# Patient Record
Sex: Male | Born: 1965 | Race: White | Hispanic: No | Smoking: Never smoker
Health system: Southern US, Community
[De-identification: ages and names within clinical notes are randomized; demographics above are authoritative.]

## PROBLEM LIST (undated history)

## (undated) ENCOUNTER — Emergency Department (HOSPITAL_COMMUNITY): Admission: EM | Discharge status: Against Medical Advice | Payer: Self-pay

## (undated) DIAGNOSIS — F1010 Alcohol abuse, uncomplicated: Secondary | ICD-10-CM

## (undated) DIAGNOSIS — E7800 Pure hypercholesterolemia, unspecified: Secondary | ICD-10-CM

## (undated) DIAGNOSIS — I10 Essential (primary) hypertension: Secondary | ICD-10-CM

## (undated) DIAGNOSIS — R45851 Suicidal ideations: Secondary | ICD-10-CM

## (undated) DIAGNOSIS — E119 Type 2 diabetes mellitus without complications: Secondary | ICD-10-CM

## (undated) HISTORY — PX: KNEE SURGERY: SHX244

## (undated) HISTORY — DX: Pure hypercholesterolemia, unspecified: E78.00

## (undated) HISTORY — DX: Essential (primary) hypertension: I10

## (undated) HISTORY — PX: FOOT SURGERY: SHX648

---

## 2011-10-17 ENCOUNTER — Ambulatory Visit: Payer: Self-pay

## 2013-02-10 ENCOUNTER — Emergency Department (HOSPITAL_COMMUNITY)
Admission: EM | Admit: 2013-02-10 | Discharge: 2013-02-10 | Disposition: A | Discharge status: Home / Self Care | Payer: Self-pay | Attending: Emergency Medicine | Admitting: Emergency Medicine

## 2013-02-10 ENCOUNTER — Encounter (HOSPITAL_COMMUNITY): Payer: Self-pay | Admitting: *Deleted

## 2013-02-10 DIAGNOSIS — F1010 Alcohol abuse, uncomplicated: Secondary | ICD-10-CM | POA: Insufficient documentation

## 2013-02-10 DIAGNOSIS — F17200 Nicotine dependence, unspecified, uncomplicated: Secondary | ICD-10-CM | POA: Insufficient documentation

## 2013-02-10 DIAGNOSIS — F10929 Alcohol use, unspecified with intoxication, unspecified: Secondary | ICD-10-CM

## 2013-02-10 LAB — COMPREHENSIVE METABOLIC PANEL
Albumin: 4.2 g/dL (ref 3.5–5.2)
Alkaline Phosphatase: 83 U/L (ref 39–117)
BUN: 8 mg/dL (ref 6–23)
Calcium: 8.9 mg/dL (ref 8.4–10.5)
GFR calc Af Amer: >90 mL/min (ref >90)
Glucose, Bld: 114 mg/dL — ABNORMAL HIGH (ref 70–99)
Potassium: 3.3 mEq/L — ABNORMAL LOW (ref 3.5–5.1)
Sodium: 140 mEq/L (ref 135–145)
Total Protein: 8.2 g/dL (ref 6.0–8.3)

## 2013-02-10 LAB — CBC WITH DIFFERENTIAL/PLATELET
Basophils Relative: 0 % (ref 0–1)
Eosinophils Absolute: 0.2 10*3/uL (ref 0.0–0.7)
Eosinophils Relative: 3 % (ref 0–5)
MCH: 31.1 pg (ref 26.0–34.0)
MCHC: 33.3 g/dL (ref 30.0–36.0)
MCV: 93.2 fL (ref 78.0–100.0)
Monocytes Relative: 4 % (ref 3–12)
Neutrophils Relative %: 48 % (ref 43–77)
Platelets: 254 10*3/uL (ref 150–400)

## 2013-02-10 LAB — RAPID URINE DRUG SCREEN, HOSP PERFORMED
Amphetamines: NOT DETECTED
Benzodiazepines: NOT DETECTED
Opiates: NOT DETECTED
Tetrahydrocannabinol: NOT DETECTED

## 2013-02-10 LAB — GLUCOSE, CAPILLARY: Glucose-Capillary: 109 mg/dL — ABNORMAL HIGH (ref 70–99)

## 2013-02-10 MED ORDER — THIAMINE HCL 100 MG/ML IJ SOLN: 100.0000 mg | Freq: Once | INTRAMUSCULAR | Status: DC

## 2013-02-10 MED ORDER — SODIUM CHLORIDE 0.9 % IV BOLUS (SEPSIS): 1000.0000 mL | Freq: Once | INTRAVENOUS | Status: DC

## 2013-02-10 MED ORDER — ZIPRASIDONE MESYLATE 20 MG IM SOLR: 10.0000 mg | Freq: Once | INTRAMUSCULAR | Status: DC | PRN

## 2013-02-10 NOTE — ED Provider Notes (Signed)
Pt refuses IV fluids or additional treatment.  He is awake and alert at this time.  He denies SI or HI.  He is not interested in treatment for alcohol abuse.  He does not appear to be psychotic or responding to internal stimuli.  Pt requests to be released.  At this time, I do not feel he meets criteria for involuntary commitment.  He does not appear to be a danger to himself or others although clearly his alcoholism is detrimental to his health.  Alcohol treatment offered but pt refuses.  He state the only way we could help him is by giving him something alcoholic to drink.  Filed Vitals:   02/10/13 0500  BP:   Pulse: 73  Temp:   Resp:      Celene Kras, MD 02/10/13 [phone]

## 2013-02-10 NOTE — ED Notes (Signed)
Pt. Changed into new blue scrubs. Pt. Has 2 back packs,1 Belongings bag, computer bag. Pt. Has pants, sweater, boots, belt, socks, underwear. pt. And belongings wanded and searched by security.

## 2013-02-10 NOTE — ED Provider Notes (Addendum)
History     CSN: [id number]  Arrival date & time 02/10/13  0039   First MD Initiated Contact with Patient 02/10/13 0136      No chief complaint on file. Level V caveat secondary to intoxication  (Consider location/radiation/quality/duration/timing/severity/associated sxs/prior treatment) HPI WARRICK LLERA is a 47 y.o. male with no pertinent medical history presents via EMS [facility] a [facility]" where patient was found to be intoxicated.  Pt says he's been drinking, denies any pain.  Poor historian with EtOH on board. Symptoms are severe, constant unchanged without interventions.   History reviewed. No pertinent past medical history.  History reviewed. No pertinent past surgical history.  History reviewed. No pertinent family history.  History  Substance Use Topics  . Smoking status: Current Every Day Smoker  . Smokeless tobacco: Current User  . Alcohol Use: Yes      Review of Systems Level V caveat 2/2 to intoxication Allergies  Review of patient's allergies indicates no known allergies.  Home Medications  No current outpatient prescriptions on file.  BP 124/86  Pulse 86  Temp(Src) 97.4 F (36.3 C) (Oral)  Resp 18  SpO2 98%  Physical Exam  Nursing notes reviewed.  Electronic medical record reviewed. VITAL SIGNS:   Filed Vitals:   02/10/13 0040  BP: 124/86  Pulse: 86  Temp: 97.4 F (36.3 C)  TempSrc: Oral  Resp: 18  SpO2: 98%   CONSTITUTIONAL: Awake, oriented, appears non-toxic HENT: Atraumatic, normocephalic, oral mucosa pink and moist, airway patent.  Chewing tobacco in mouth, foul breath.  Nares patent without drainage. External ears normal. EYES: Conjunctiva injected, EOMI, PERRLA NECK: Trachea midline, non-tender, supple CARDIOVASCULAR: Normal heart rate, Normal rhythm, No murmurs, rubs, gallops PULMONARY/CHEST: Clear to auscultation, no rhonchi, wheezes, or rales. Symmetrical breath sounds. Non-tender. ABDOMINAL: Non-distended, soft, non-tender  - no rebound or guarding.  BS normal. NEUROLOGIC: Non-focal, moving all four extremities, no gross sensory or motor deficits. EXTREMITIES: No clubbing, cyanosis, or edema SKIN: Warm, Dry, No erythema, scattered pustules over abdomen and upper chest, no cellulitis.  ED Course  Procedures (including critical care time)  Date: 02/10/2013  Rate: 87  Rhythm: normal sinus rhythm  QRS Axis: normal  Intervals: normal  ST/T Wave abnormalities: normal  Conduction Disutrbances: none  Narrative Interpretation: unremarkable  Labs Reviewed  GLUCOSE, CAPILLARY - Abnormal; Notable for the following:    Glucose-Capillary 109 (*)    All other components within normal limits  CBC WITH DIFFERENTIAL  COMPREHENSIVE METABOLIC PANEL  URINE RAPID DRUG SCREEN (HOSP PERFORMED)  ETHANOL   No results found.   1. Alcohol intoxication   2. Tobacco abuse       MDM  KABIR BRANNOCK is a 47 y.o. male presents intoxicated, no signs of trauma.  Pt initially ambulatory, then EMS called 2/2 to pt being so drunk.  No history of trauma do not think any further workup is indicated at this time.  On awakening, patient still appears intoxicated however is extremely emotionally labile, hyperreligious holding on to a small burlap/jute crucifix, crying - referring to a "little feller" who died, he was evidently in the [employer] and killed someone - perseverating on that. He is not redirectable, tangential.  He may still be intoxicated, however, I think his behavior is bizarre and requires further workup.  Medical clearance labs ordered.  Normal EKG. ACT consult placed.   D/w Dr. Cleotis Lema, MD 02/10/13 4098  Jones Skene, MD 02/10/13  0711 

## 2013-02-10 NOTE — ED Notes (Signed)
Per EMS report: EMS called for pt after pt showed up at a warming [facility] and drinking.  [employer] called and attempted to take to another shelter which would not take the pt. Pt was taken to jail but was unable to walk on his own into jail.  EMS called for transport to hospital.  Pt currently on a long spine board d/t pt attempting to swing at EMS staff. Pt smells of ETOH and evidence of chewing tobacco noted on lips.

## 2013-02-10 NOTE — ED Notes (Signed)
ZOX:[id number]<AV> Expected date:<BR> Expected time:<BR> Means of arrival:<BR> Comments:<BR> EMS, ETOH and combative from  jail; 47yo male

## 2013-02-10 NOTE — ED Notes (Addendum)
EKG given to EDP, Bonk,MD. Pt. CBG 109, RN,Merle made aware.

## 2013-03-04 ENCOUNTER — Encounter (HOSPITAL_COMMUNITY): Payer: Self-pay | Admitting: Emergency Medicine

## 2013-03-04 ENCOUNTER — Emergency Department (HOSPITAL_COMMUNITY)
Admission: EM | Admit: 2013-03-04 | Discharge: 2013-03-04 | Disposition: A | Discharge status: Home / Self Care | Payer: Self-pay | Attending: Emergency Medicine | Admitting: Emergency Medicine

## 2013-03-04 DIAGNOSIS — H6120 Impacted cerumen, unspecified ear: Secondary | ICD-10-CM | POA: Insufficient documentation

## 2013-03-04 DIAGNOSIS — F17200 Nicotine dependence, unspecified, uncomplicated: Secondary | ICD-10-CM | POA: Insufficient documentation

## 2013-03-04 DIAGNOSIS — H9319 Tinnitus, unspecified ear: Secondary | ICD-10-CM | POA: Insufficient documentation

## 2013-03-04 DIAGNOSIS — H9190 Unspecified hearing loss, unspecified ear: Secondary | ICD-10-CM | POA: Insufficient documentation

## 2013-03-04 DIAGNOSIS — H6123 Impacted cerumen, bilateral: Secondary | ICD-10-CM

## 2013-03-04 HISTORY — DX: Suicidal ideations: R45.851

## 2013-03-04 HISTORY — DX: Alcohol abuse, uncomplicated: F10.10

## 2013-03-04 HISTORY — DX: Type 2 diabetes mellitus without complications: E11.9

## 2013-03-04 MED ORDER — DOCUSATE SODIUM 100 MG PO CAPS
100.0000 mg | ORAL_CAPSULE | Freq: Once | ORAL | Status: DC
  Filled 2013-03-04: qty 1

## 2013-03-04 MED ORDER — DOCUSATE NICU RECTAL SYRINGE 10 MG/ML: 1.0000 mL | Freq: Once | RECTAL | Status: DC

## 2013-03-04 MED ORDER — DOCUSATE SODIUM 50 MG/5ML PO LIQD
100.0000 mg | Freq: Once | ORAL | Status: AC
  Administered 2013-03-04: 100 mg
  Filled 2013-03-04: qty 10

## 2013-03-04 NOTE — ED Provider Notes (Signed)
History     CSN: [id number]  Arrival date & time 03/04/13  1130   First MD Initiated Contact with Patient 03/04/13 1140      Chief Complaint  Patient presents with  . Otalgia    (Consider location/radiation/quality/duration/timing/severity/associated sxs/prior treatment) HPI  Adrian Schultz is a 47 y.o. male complaining of left ear pain worsening over the course of 4 days. It is associated with decreased hearing acuity and are roaring tinnitus. Patient denies fever, nasal drainage, cough, sore throat, chest pain or shortness of breath.  No past medical history on file.  No past surgical history on file.  No family history on file.  History  Substance Use Topics  . Smoking status: Current Every Day Smoker  . Smokeless tobacco: Current User  . Alcohol Use: Yes      Review of Systems  Constitutional: Negative for fever.  HENT: Positive for hearing loss, ear pain and tinnitus. Negative for congestion and sore throat.   Respiratory: Negative for shortness of breath.   Cardiovascular: Negative for chest pain.  Gastrointestinal: Negative for nausea, vomiting, abdominal pain and diarrhea.  All other systems reviewed and are negative.    Allergies  Review of patient's allergies indicates no known allergies.  Home Medications  No current outpatient prescriptions on file.  There were no vitals taken for this visit.  Physical Exam  Nursing note and vitals reviewed. Constitutional: He is oriented to person, place, and time. He appears well-developed and well-nourished. No distress.  HENT:  Head: Normocephalic.  Bilateral cerumen impaction  Eyes: Conjunctivae and EOM are normal.  Cardiovascular: Normal rate.   Pulmonary/Chest: Effort normal. No stridor.  Musculoskeletal: Normal range of motion.  Neurological: He is alert and oriented to person, place, and time.  Psychiatric: He has a normal mood and affect.    ED Course  EAR CERUMEN REMOVAL Date/Time: 03/04/2013 2:28  PM Performed by: Wynetta Emery Authorized by: Wynetta Emery Consent: Verbal consent obtained. Risks and benefits: risks, benefits and alternatives were discussed Consent given by: patient Patient understanding: patient states understanding of the procedure being performed Patient identity confirmed: verbally with patient and arm band Local anesthetic: none Ceruminolytics applied: Ceruminolytics applied prior to the procedure. Location details: right ear Procedure type: curette and irrigation Patient sedated: no Patient tolerance: Patient tolerated the procedure well with no immediate complications. Comments: Large amounts of dark serum and removed after flushing with 750 mL of 1:1 warmed sterile water and hydrogen peroxide. No trauma to eardrum or external ear canal from procedure.  Patient reports significant subjective improvement in both pain and hearing acuity after procedure.   (including critical care time)  Labs Reviewed - No data to display No results found.  1:44 PM Patient care time extended because we are having issues obtaining docusate to soften cerumen. Nursing irrigation does not clear the impaction. I will perform the irrigation myself.   1. Cerumen impaction, bilateral       MDM   Adrian Schultz is a 47 y.o. male bilateral cerumen impaction, left ear with pain and decreased hearing acuity. Performed irrigation with relief of impaction and subjective improvement in symptoms. Patient advised to use Debrox.    Filed Vitals:   03/04/13 1200  BP: 147/95  Pulse: 81  Temp: 97.6 F (36.4 C)  TempSrc: Oral  Resp: 18  SpO2: 95%     Pt verbalized understanding and agrees with care plan. Outpatient follow-up and return precautions given.  Wynetta Emery, PA-C 03/04/13 1527

## 2013-03-04 NOTE — ED Notes (Signed)
3 dayghx of l/ear pain. Security-escort at bedside

## 2013-03-04 NOTE — ED Notes (Signed)
Pt tolerated irrigation of l/ear by PA. Large amt. dried wax removed. Report called to Academic librarian at [facility]

## 2013-03-04 NOTE — ED Provider Notes (Signed)
Medical screening examination/treatment/procedure(s) were performed by non-physician practitioner and as supervising physician I was immediately available for consultation/collaboration.  Osiris Odriscoll L Manna Gose, MD 03/04/13 1534 

## 2013-07-09 ENCOUNTER — Encounter (HOSPITAL_COMMUNITY): Payer: Self-pay

## 2013-07-09 ENCOUNTER — Emergency Department (HOSPITAL_COMMUNITY)
Admission: EM | Admit: 2013-07-09 | Discharge: 2013-07-09 | Disposition: A | Discharge status: Home / Self Care | Payer: Self-pay | Attending: Emergency Medicine | Admitting: Emergency Medicine

## 2013-07-09 DIAGNOSIS — F17200 Nicotine dependence, unspecified, uncomplicated: Secondary | ICD-10-CM | POA: Insufficient documentation

## 2013-07-09 DIAGNOSIS — IMO0002 Reserved for concepts with insufficient information to code with codable children: Secondary | ICD-10-CM | POA: Diagnosis present

## 2013-07-09 DIAGNOSIS — Z8659 Personal history of other mental and behavioral disorders: Secondary | ICD-10-CM | POA: Insufficient documentation

## 2013-07-09 DIAGNOSIS — E119 Type 2 diabetes mellitus without complications: Secondary | ICD-10-CM | POA: Insufficient documentation

## 2013-07-09 DIAGNOSIS — F1010 Alcohol abuse, uncomplicated: Secondary | ICD-10-CM | POA: Insufficient documentation

## 2013-07-09 DIAGNOSIS — R000 Tachycardia, unspecified: Secondary | ICD-10-CM | POA: Insufficient documentation

## 2013-07-09 LAB — GLUCOSE, CAPILLARY

## 2013-07-09 MED ORDER — SODIUM CHLORIDE 0.9 % IV BOLUS (SEPSIS)
1000.0000 mL | Freq: Once | INTRAVENOUS | Status: AC
  Administered 2013-07-09: 1000 mL via INTRAVENOUS

## 2013-07-09 NOTE — ED Notes (Signed)
Took pt a pair of scrubs for him to change into and pt said why. Charge nurse and [employer] had to go an explain to him if he wanted to be seen he needed to change and pt said ok. Pt said he wanted to be seen by the doctor.

## 2013-07-09 NOTE — ED Notes (Signed)
Bed: WA15 Expected date:  Expected time:  Means of arrival:  Comments: EMS ETOH 

## 2013-07-09 NOTE — ED Notes (Signed)
Per EMS pt found outside intoxicated by [employer], pt has no medical complaints

## 2013-07-09 NOTE — ED Provider Notes (Signed)
CSN: [id number]     Arrival date & time 07/09/13  1814 History     First MD Initiated Contact with Patient 07/09/13 1822     Chief Complaint  Patient presents with  . Alcohol Intoxication    detox   (Consider location/radiation/quality/duration/timing/severity/associated sxs/prior Treatment) Patient is a 47 y.o. male presenting with intoxication. The history is provided by the patient and the EMS personnel.  Alcohol Intoxication This is a new problem. The current episode started 1 to 2 hours ago. The problem occurs constantly. The problem has been gradually improving. Pertinent negatives include no chest pain, no abdominal pain, no headaches and no shortness of breath. Nothing aggravates the symptoms. Nothing relieves the symptoms. He has tried nothing for the symptoms. The treatment provided no relief.    Past Medical History  Diagnosis Date  . ETOH abuse   . Suicidal ideation   . Diabetes mellitus without complication    Past Surgical History  Procedure Laterality Date  . Knee surgery    . Foot surgery     No family history on file. History  Substance Use Topics  . Smoking status: Current Every Day Smoker    Types: Cigarettes  . Smokeless tobacco: Current User  . Alcohol Use: Yes    Review of Systems  Constitutional: Negative for fever.  HENT: Negative for rhinorrhea, drooling and neck pain.   Eyes: Negative for pain.  Respiratory: Negative for cough and shortness of breath.   Cardiovascular: Negative for chest pain and leg swelling.  Gastrointestinal: Negative for nausea, vomiting, abdominal pain and diarrhea.  Genitourinary: Negative for dysuria and hematuria.  Musculoskeletal: Negative for gait problem.  Skin: Negative for color change.  Neurological: Negative for numbness and headaches.  Hematological: Negative for adenopathy.  Psychiatric/Behavioral: Negative for behavioral problems.  All other systems reviewed and are negative.    Allergies  Review of  patient's allergies indicates no known allergies.  Home Medications  No current outpatient prescriptions on file. BP 114/66  Pulse 102  Temp(Src) 99.4 F (37.4 C) (Oral)  Resp 18  SpO2 91% Physical Exam  Nursing note and vitals reviewed. Constitutional: He is oriented to person, place, and time. He appears well-developed and well-nourished.  HENT:  Head: Normocephalic and atraumatic.  Right Ear: External ear normal.  Left Ear: External ear normal.  Nose: Nose normal.  Mouth/Throat: Oropharynx is clear and moist. No oropharyngeal exudate.  Eyes: Conjunctivae and EOM are normal. Pupils are equal, round, and reactive to light.  Neck: Normal range of motion. Neck supple.  Cardiovascular: Regular rhythm, normal heart sounds and intact distal pulses.  Exam reveals no gallop and no friction rub.   No murmur heard. Pulmonary/Chest: Effort normal and breath sounds normal. No respiratory distress. He has no wheezes.  Abdominal: Soft. Bowel sounds are normal. He exhibits no distension. There is no tenderness. There is no rebound and no guarding.  Musculoskeletal: Normal range of motion. He exhibits no edema and no tenderness.  Neurological: He is alert and oriented to person, place, and time.  Mild to moderate intoxication. Pt is interactive, will follow commands. Denies pain.   Skin: Skin is warm and dry.  Psychiatric: He has a normal mood and affect. His behavior is normal.    ED Course   Procedures (including critical care time)  Labs Reviewed  GLUCOSE, CAPILLARY - Abnormal; Notable for the following:    Glucose-Capillary 101 (*)    All other components within normal limits   No results found.  1. Intoxication     MDM  6:40 PM 47 y.o. male brought in by EMS after found intoxicated sleeping on the sidewalk. Pt has mild tachycardia here, otherwise AFVSS here. A/o x3, following commands. Denies pain. Doubt injury. Will get BS and bolus.   10:14 PM: Pt now mentating well,  ambulatory, clinically sober. I have discussed the diagnosis/risks/treatment options with the patient and believe the pt to be eligible for discharge home to follow-up with pcp as needed. We also discussed returning to the ED immediately if new or worsening sx occur. We discussed the sx which are most concerning (e.g., pain, cp, sob, further intoxication) that necessitate immediate return. Any new prescriptions provided to the patient are listed below.  New Prescriptions   No medications on file     Junius Argyle, MD 07/10/13 1204

## 2014-01-18 DIAGNOSIS — E119 Type 2 diabetes mellitus without complications: Secondary | ICD-10-CM | POA: Insufficient documentation

## 2014-01-18 DIAGNOSIS — R269 Unspecified abnormalities of gait and mobility: Secondary | ICD-10-CM | POA: Insufficient documentation

## 2014-01-18 DIAGNOSIS — Y929 Unspecified place or not applicable: Secondary | ICD-10-CM | POA: Insufficient documentation

## 2014-01-18 DIAGNOSIS — Y9301 Activity, walking, marching and hiking: Secondary | ICD-10-CM | POA: Insufficient documentation

## 2014-01-18 DIAGNOSIS — Z9889 Other specified postprocedural states: Secondary | ICD-10-CM | POA: Insufficient documentation

## 2014-01-18 DIAGNOSIS — F17200 Nicotine dependence, unspecified, uncomplicated: Secondary | ICD-10-CM | POA: Insufficient documentation

## 2014-01-18 DIAGNOSIS — S8010XA Contusion of unspecified lower leg, initial encounter: Secondary | ICD-10-CM | POA: Insufficient documentation

## 2014-01-18 DIAGNOSIS — W108XXA Fall (on) (from) other stairs and steps, initial encounter: Secondary | ICD-10-CM | POA: Insufficient documentation

## 2014-01-19 ENCOUNTER — Emergency Department (HOSPITAL_COMMUNITY)
Admission: EM | Admit: 2014-01-19 | Discharge: 2014-01-19 | Disposition: A | Discharge status: Home / Self Care | Payer: Self-pay | Attending: Emergency Medicine | Admitting: Emergency Medicine

## 2014-01-19 ENCOUNTER — Encounter (HOSPITAL_COMMUNITY): Payer: Self-pay | Admitting: Emergency Medicine

## 2014-01-19 ENCOUNTER — Emergency Department (HOSPITAL_COMMUNITY): Payer: Self-pay

## 2014-01-19 DIAGNOSIS — S8012XA Contusion of left lower leg, initial encounter: Secondary | ICD-10-CM

## 2014-01-19 MED ORDER — NAPROXEN 500 MG PO TABS: 500.0000 mg | ORAL_TABLET | Freq: Two times a day (BID) | ORAL | Status: DC

## 2014-01-19 MED ORDER — TRAMADOL HCL 50 MG PO TABS: 50.0000 mg | ORAL_TABLET | Freq: Four times a day (QID) | ORAL | Status: DC | PRN

## 2014-01-19 NOTE — ED Provider Notes (Signed)
CSN: [id number]     Arrival date & time 01/18/14  2338 History   None    Chief Complaint  Patient presents with  . left leg pain      (Consider location/radiation/quality/duration/timing/severity/associated sxs/prior Treatment) HPI Comments: 48 year old male, history of a drop foot on the left, states that he lost his balance walking down the stairs and fell on his left lateral proximal lower extremity. This was acute in onset, occurred several hours prior to arrival, the pain is persistent and worse with ambulation. He does not use a brace to walk, he has been hobbling on his leg. He denies any other injuries and has had no medication prior to arrival.  The history is provided by the patient.    Past Medical History  Diagnosis Date  . ETOH abuse   . Suicidal ideation   . Diabetes mellitus without complication    Past Surgical History  Procedure Laterality Date  . Knee surgery    . Foot surgery     History reviewed. No pertinent family history. History  Substance Use Topics  . Smoking status: Current Every Day Smoker    Types: Cigarettes  . Smokeless tobacco: Current User  . Alcohol Use: Yes    Review of Systems  Musculoskeletal: Positive for gait problem. Negative for joint swelling.  Skin: Negative for rash and wound.      Allergies  Review of patient's allergies indicates no known allergies.  Home Medications   Current Outpatient Rx  Name  Route  Sig  Dispense  Refill  . naproxen (NAPROSYN) 500 MG tablet   Oral   Take 1 tablet (500 mg total) by mouth 2 (two) times daily with a meal.   30 tablet   0   . traMADol (ULTRAM) 50 MG tablet   Oral   Take 1 tablet (50 mg total) by mouth every 6 (six) hours as needed.   15 tablet   0    BP 121/76  Pulse 101  Temp(Src) 98.1 F (36.7 C) (Oral)  Resp 18  SpO2 95% Physical Exam  Nursing note and vitals reviewed. Constitutional: He appears well-developed and well-nourished. No distress.  HENT:  Head:  Normocephalic and atraumatic.  Eyes: Conjunctivae are normal. No scleral icterus.  Cardiovascular: Normal rate, regular rhythm and intact distal pulses.   Pulmonary/Chest: Effort normal and breath sounds normal.  Musculoskeletal: He exhibits tenderness ( Tender to palpation over the left lateral fibular head, no deformity swelling or redness, supple left knee joint and left ankle joint). He exhibits no edema.  No tenderness with manipulation of the left ankle, supple joints and soft compartments, dropfoot present  Neurological: He is alert.  Skin: Skin is warm and dry. No rash noted. He is not diaphoretic.    ED Course  Procedures (including critical care time) Labs Review Labs Reviewed - No data to display Imaging Review Dg Tibia/fibula Left  01/19/2014   CLINICAL DATA:  Status post fall; diffuse left lower leg pain.  EXAM: LEFT TIBIA AND FIBULA - 2 VIEW  COMPARISON:  None.  FINDINGS: There is no evidence of fracture or dislocation. The tibia and fibula appear grossly intact. The knee joint is grossly unremarkable. No significant knee joint effusion is seen. A small fabella is noted. No significant soft tissue abnormalities are characterized on radiograph.  Scattered vascular calcifications are seen.  IMPRESSION: 1. No evidence of fracture or dislocation. 2. Scattered vascular calcifications seen.   Electronically Signed   By: Roanna Raider  M.D.   On: 01/19/2014 00:58   Dg Ankle Complete Left  01/19/2014   CLINICAL DATA:  Left ankle injury and pain.  EXAM: LEFT ANKLE COMPLETE - 3+ VIEW  COMPARISON:  None.  FINDINGS: There is mild widening at the medial tibiotalar joint. There is no evidence of fracture or dislocation.  There is no evidence of ankle effusion.  No other bony abnormalities are present.  IMPRESSION: Widening of the lateral tibiotalar joint which may represent normal variant but ligamentous injury is not excluded. No evidence of fracture.   Electronically Signed   By: Laveda AbbeJeff  Hu M.D.    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 Doubt acute fracture or injury to the ligamentous structures of the ankle. He does not describe any ankle injuries but purely a proximal left lower extremity injury just distal to the knee laterally.  Referral to orthopedics   Meds given in ED:  Medications - No data to display  New Prescriptions   NAPROXEN (NAPROSYN) 500 MG TABLET    Take 1 tablet (500 mg total) by mouth 2 (two) times daily with a meal.   TRAMADOL (ULTRAM) 50 MG TABLET    Take 1 tablet (50 mg total) by mouth every 6 (six) hours as needed.        Vida RollerBrian D Betta Balla, MD 01/19/14 [phone]

## 2014-01-19 NOTE — Discharge Instructions (Signed)
X-rays showed no signs of broken bones, used to crutches and immobilization splint as needed, followup with orthopedics within one or 2 weeks if no improvement  Please call your doctor for a followup appointment within 24-48 hours. When you talk to your doctor please let them know that you were seen in the emergency department and have them acquire all of your records so that they can discuss the findings with you and formulate a treatment plan to fully care for your new and ongoing problems.   Emergency Department Resource Guide 1) Find a Doctor and Pay Out of Pocket Although you won't have to find out who is covered by your insurance plan, it is a good idea to ask around and get recommendations. You will then need to call the office and see if the doctor you have chosen will accept you as a new patient and what types of options they offer for patients who are self-pay. Some doctors offer discounts or will set up payment plans for their patients who do not have insurance, but you will need to ask so you aren't surprised when you get to your appointment.  2) Contact Your Local Health Department Not all health departments have doctors that can see patients for sick visits, but many do, so it is worth a call to see if yours does. If you don't know where your local health department is, you can check in your phone book. The CDC also has a tool to help you locate your state's health department, and many state websites also have listings of all of their local health departments.  3) Find a Walk-in Clinic If your illness is not likely to be very severe or complicated, you may want to try a walk in clinic. These are popping up all over the country in pharmacies, drugstores, and shopping centers. They're usually staffed by nurse practitioners or physician assistants that have been trained to treat common illnesses and complaints. They're usually fairly quick and inexpensive. However, if you have serious medical  issues or chronic medical problems, these are probably not your best option.  No Primary Care Doctor: - Call Health Connect at  [phone] - they can help you locate a primary care doctor that  accepts your insurance, provides certain services, etc. - Physician Referral Service- [phone]  Chronic Pain Problems: Organization         Address  Phone   Notes  [facility]  [phone] Patients need to be referred by their primary care doctor.   Medication Assistance: Organization         Address  Phone   Notes  [facility] Medication [facility] [address] [place], [place] [address] [phone] --Must be a resident of [place] [facility] [place] -- Must have NO insurance coverage whatsoever (no Medicaid/ Medicare, etc.) -- The pt. MUST have a primary care doctor that directs their care regularly and follows them in the community   MedAssist  [phone]   [employer]  [phone]) [phone]    Agencies that provide inexpensive medical care: Organization         Address  Phone   Notes  [facility]  [phone]   [facility]    [phone]   [facility] [address] [place], [place] [address] [phone]   [facility] [address], [place] [phone]   [facility]anned [facility]    [phone]  [facility]    [phone]   [address], [place] Phone:  [phone], Fax:  [phone] Hours of Operation:  9 am - 6 pm, M-F.  Also accepts Medicaid/Medicare and self-pay.  [facility] [place], [address], [place] Phone: [phone], Fax: [phone]. Hours of Operation:  8:30 am - 5:30 pm, M-F.  Also accepts Medicaid and self-pay.  [facility] [address], [place] Phone: [phone]   [place], [place], [place]  [phone], Ext. [phone] Mondays & Thursdays: 7-9 AM.  First 15 patients are seen on a first come, first serve basis.    [facility] Providers:  Organization         Address  Phone   Notes  [facility] [address], [place] [phone] Also accepts self-pay patients.  [facility] [address], [place]  [phone]   [place], [address], [place] [phone]   [facility] [address] [place] [phone]   Lucianne Lei [address], [place]   [phone] Only accepts [place] Access [place] patients after they have their name applied to their card.   Self-Pay (no insurance) in [facility]:  Organization         Address  Phone   Notes  Sickle Cell Patients, [facility] [place] [phone]   [facility] [place] [phone]   [facility] [place]  [place], [place], [place] [phone]   [facility]/Dr. Osei-Bonsu  [address], [place] or [address], [place] [phone] Phone number for both [place] and [place] locations is the same.  [facility] [address], [place] [phone]   [facility] [address] [place] or [address] [phone]   [facility] [address] [place] [phone], phone; [phone], fax Sees patients 1st and 3rd Saturday of every month.  Must not qualify for public or private insurance (i.e. Medicaid, Medicare, [place] Health Choice, Veterans' Benefits)  Household income should be no more than 200% of the poverty level The clinic cannot treat you if you are pregnant or think you are pregnant  Sexually transmitted  diseases are not treated at the clinic.    Dental Care: Organization         Address  Phone  Notes  [facility] [place] [phone] Accepts children up to age 37 who are enrolled in [place] or [place]; pregnant women with a Medicaid card; and children who have applied for Medicaid or [place] Health Choice, but were declined, whose parents can pay a reduced fee at time of service.  [facility]  [address], [place] [phone] Accepts children up to age 49 who are enrolled in [place] or [place]; pregnant women with a Medicaid card; and children who have applied for Medicaid or [place], but were declined, whose parents can pay a reduced  fee at time of service.  [facility] Access PRO[facility]  [place] [phone] Patients are seen by appointment only. Walk-ins are not accepted. [place] will see patients 6 years of age and older. Monday - Tuesday (8am-5pm) Most Wednesdays (8:30-5pm) $30 per visit, cash only  [facility] Adult Dental Access PROGRAM  [address], [facility] [phone] Patients are seen by appointment only. Walk-ins are not accepted. [place] will see patients 65 years of age and older. One Wednesday Evening (Monthly: Volunteer Based).  $30 per visit, cash only  [place]  [phone] for adults; Children under age 24, call Graduate Pediatric Dentistry at [phone]. Children aged 79-14, please call [phone] to request a pediatric application.  Dental services are provided in all areas of dental care including fillings, crowns and bridges, complete and partial dentures, implants, gum treatment, root canals, and extractions. Preventive care is also provided. Treatment is provided to both adults and children. Patients are selected via a  lottery and there is often a waiting list.   [facility] [address] [place]  [phone] [email or web address]   [facility] [address] [place], [place] [phone], Ext. [phone] Second and Fourth Thursday of each month, opens at 6:30 AM; Clinic ends at 9 AM.  Patients are seen on a first-come first-served basis, and a limited number are seen during each clinic.   [facility]  [address] Hillard Danker [place], [place] [phone]   Eligibility Requirements You must have lived in [place], [place], or [place] counties for at least the last three months.   You cannot be eligible for state or federal sponsored [employer], including [employer], [place], or [employer].   You generally cannot be eligible for healthcare insurance through your employer.    How to apply: Eligibility screenings are held every Tuesday and Wednesday afternoon from 1:00 pm until 4:00 pm. You do not need an appointment for the interview!  [facility] [address], [place], [place]   [place]  [place] Department  [place]  [phone]    Behavioral Health Resources in the Community: Intensive Outpatient Programs Organization         Address  Phone  Notes  [facility] [address] [place], [place] [phone]   [facility] Outpatient [address], [place], [place]   ADS: Alcohol & Drug Svcs [address] [place], [place]   [place] [address],  [place], [place] or [phone]   Substance Abuse Resources Organization         Address  Phone  Notes  Alcohol and Drug Services  [phone]   [place]  [phone]   [facility] [place]   Chinita Pester  [phone]   Residential &  Outpatient Substance Abuse Program  [phone]   Psychological Services Organization         Address  Phone  Notes  [facility] [place]  [place]  [phone]   [place] [address], [place] or [phone]    Mobile Crisis Teams Organization         Address  Phone  Notes  [facility], Mobile Crisis Care Unit  [phone]  [facility]  [address] [place], [place]   [facility] [address], [place] [phone]    Self-Help/Support Groups Organization         Address  Phone             Notes  [facility] of [place] - variety of support groups  [place] Call for more information  Narcotics Anonymous (NA), [facility] [address], [employer] [place]  2 meetings at this location   Special educational needs teacher         Address  Phone  Notes  [facility] Residential Treatment [place],    [place]  [phone]   [facility]  [address] [place] [address], [place], [place]   [place] [address], [place] [phone] Admissions: 8am-3pm M-F  Incentives Substance [place] [address]    [address], [place] [phone]   [facility] [address] [place], [place], [place]   [facility] [address]  [place], [place]   [facility] - Intensive Outpatient [address], [place], [place]   [facility] ([place] [place].,  [place], [place] [phone] or [phone]   [facility] (RTS) [address], [place], [place] Accepts Medicaid  [facility] [address],  [place] [phone] Substance Abuse/Addiction Treatment   [facility] Organization          Address  Phone  Notes  [facility]  [phone]   Domenic Schwab, PhD [address] [place], [place]   [phone] or [phone]   [place] [facility] [place], [place] [phone]   [facility] [address] [place], [place] [phone] Insurance/Medicaid/sponsorship through [facility] and Families [address] [place]                                    [place], [place] [phone] [place] [address], [place] [phone]    Dr. Adele Schilder  [phone]   [facility] Dept. 1) [address] [place] 2) [facility] 3)  [place] [address], [place] [phone]  [phone]   [place] [phone] or [phone] (After Hours)

## 2014-01-19 NOTE — ED Notes (Signed)
Patient fell on ice this afternoon  And hurt his left leg.  Able to walk

## 2014-01-19 NOTE — ED Notes (Signed)
Called without response 

## 2015-02-27 ENCOUNTER — Emergency Department (HOSPITAL_COMMUNITY): Payer: Self-pay

## 2015-02-27 ENCOUNTER — Emergency Department (HOSPITAL_COMMUNITY)
Admission: EM | Admit: 2015-02-27 | Discharge: 2015-02-27 | Disposition: A | Discharge status: Home / Self Care | Payer: Self-pay | Attending: Emergency Medicine | Admitting: Emergency Medicine

## 2015-02-27 ENCOUNTER — Encounter (HOSPITAL_COMMUNITY): Payer: Self-pay | Admitting: Emergency Medicine

## 2015-02-27 DIAGNOSIS — E119 Type 2 diabetes mellitus without complications: Secondary | ICD-10-CM | POA: Insufficient documentation

## 2015-02-27 DIAGNOSIS — Z720 Tobacco use: Secondary | ICD-10-CM | POA: Insufficient documentation

## 2015-02-27 DIAGNOSIS — J159 Unspecified bacterial pneumonia: Secondary | ICD-10-CM | POA: Insufficient documentation

## 2015-02-27 DIAGNOSIS — Z791 Long term (current) use of non-steroidal anti-inflammatories (NSAID): Secondary | ICD-10-CM | POA: Insufficient documentation

## 2015-02-27 DIAGNOSIS — R05 Cough: Secondary | ICD-10-CM

## 2015-02-27 DIAGNOSIS — R059 Cough, unspecified: Secondary | ICD-10-CM

## 2015-02-27 DIAGNOSIS — J189 Pneumonia, unspecified organism: Secondary | ICD-10-CM

## 2015-02-27 MED ORDER — IPRATROPIUM-ALBUTEROL 0.5-2.5 (3) MG/3ML IN SOLN
3.0000 mL | Freq: Once | RESPIRATORY_TRACT | Status: AC
  Administered 2015-02-27: 3 mL via RESPIRATORY_TRACT
  Filled 2015-02-27: qty 3

## 2015-02-27 MED ORDER — ALBUTEROL SULFATE HFA 108 (90 BASE) MCG/ACT IN AERS
2.0000 | INHALATION_SPRAY | Freq: Once | RESPIRATORY_TRACT | Status: AC
  Administered 2015-02-27: 2 via RESPIRATORY_TRACT
  Filled 2015-02-27: qty 6.7

## 2015-02-27 MED ORDER — LEVOFLOXACIN 500 MG PO TABS
500.0000 mg | ORAL_TABLET | Freq: Once | ORAL | Status: DC
  Filled 2015-02-27: qty 1

## 2015-02-27 MED ORDER — LEVOFLOXACIN 500 MG PO TABS: 500.0000 mg | ORAL_TABLET | Freq: Every day | ORAL | Status: DC

## 2015-02-27 MED ORDER — GUAIFENESIN-CODEINE 100-10 MG/5ML PO SOLN
5.0000 mL | Freq: Once | ORAL | Status: AC
  Administered 2015-02-27: 5 mL via ORAL
  Filled 2015-02-27: qty 5

## 2015-02-27 MED ORDER — GUAIFENESIN-CODEINE 100-10 MG/5ML PO SOLN: 10.0000 mL | Freq: Four times a day (QID) | ORAL | Status: DC | PRN

## 2015-02-27 NOTE — ED Notes (Signed)
Sats 94% when ambulating.

## 2015-02-27 NOTE — ED Provider Notes (Signed)
TIME SEEN: 5:30 AM  CHIEF COMPLAINT: Cough, posttussive emesis  HPI: Pt is a 49 y.o. male with history of non-insulin-dependent diabetes, previous history of tobacco use who quit one year ago who presents to the emergency department 3 days of chills, productive cough with white sputum production, posttussive emesis. Denies sick contacts or travel. No chest pain. Does have body aches and some pain in his back with coughing.  No history of CAD, PE or DVT. Denies history of COPD, asthma. Does not wear oxygen.  ROS: See HPI Constitutional: no fever  Eyes: no drainage  ENT: no runny nose   Cardiovascular:  no chest pain  Resp:  SOB  GI: no vomiting GU: no dysuria Integumentary: no rash  Allergy: no hives  Musculoskeletal: no leg swelling  Neurological: no slurred speech ROS otherwise negative  PAST MEDICAL HISTORY/PAST SURGICAL HISTORY:  Past Medical History  Diagnosis Date  . ETOH abuse   . Suicidal ideation   . Diabetes mellitus without complication     MEDICATIONS:  Prior to Admission medications   Medication Sig Start Date End Date Taking? Authorizing Provider  naproxen (NAPROSYN) 500 MG tablet Take 1 tablet (500 mg total) by mouth 2 (two) times daily with a meal. 01/19/14   Eber HongBrian Miller, MD  traMADol (ULTRAM) 50 MG tablet Take 1 tablet (50 mg total) by mouth every 6 (six) hours as needed. 01/19/14   Eber HongBrian Miller, MD    ALLERGIES:  No Known Allergies  SOCIAL HISTORY:  History  Substance Use Topics  . Smoking status: Current Every Day Smoker    Types: Cigarettes  . Smokeless tobacco: Current User  . Alcohol Use: Yes    FAMILY HISTORY: No family history on file.  EXAM: BP 159/93 mmHg  Pulse 106  Temp(Src) 98.6 F (37 C) (Oral)  Resp 25  SpO2 93% CONSTITUTIONAL: Alert and oriented and responds appropriately to questions. Well-appearing; well-nourished, nontoxic, has dry cough HEAD: Normocephalic EYES: Conjunctivae clear, PERRL ENT: normal nose; no rhinorrhea;  moist mucous membranes; pharynx without lesions noted NECK: Supple, no meningismus, no LAD  CARD: Regular and tachycardic; S1 and S2 appreciated; no murmurs, no clicks, no rubs, no gallops RESP: Normal chest excursion without splinting or tachypnea; breath sounds clear and equal bilaterally; no wheezes, no rhonchi, no rales, no hypoxia or respiratory distress, speaking full sentences ABD/GI: Normal bowel sounds; non-distended; soft, non-tender, no rebound, no guarding BACK:  The back appears normal and is non-tender to palpation, there is no CVA tenderness EXT: Normal ROM in all joints; non-tender to palpation; no edema; normal capillary refill; no cyanosis, no calf tenderness or swelling    SKIN: Normal color for age and race; warm NEURO: Moves all extremities equally PSYCH: The patient's mood and manner are appropriate. Grooming and personal hygiene are appropriate.  MEDICAL DECISION MAKING: Patient here with productive cough for the past 3 days, subjective fevers chills. Chest x-ray shows left apical airspace opacity concerning for pneumonia. He is a smoker but denies history of COPD or asthma. Given DuoNeb with some symptomatic relief as well as guaifenesin with codeine. Will discharge on Levaquin and provided coupon for same. Given albuterol inhaler to take home. We'll also discharge with prescription for guaifenesin with cane. Sats 94% with ambulation. Given information on how to quit smoking. Given outpatient follow-up information. Discussed return precautions. Patient verbalized understanding and is comfortable with plan.    EKG Interpretation  Date/Time:  Sunday February 27 2015 04:51:19 EDT Ventricular Rate:  103 PR Interval:  128 QRS Duration: 80 QT Interval:  332 QTC Calculation: 434 R Axis:   24 Text Interpretation:  Age not entered, assumed to be  49 years old for purpose of ECG interpretation Sinus tachycardia Low voltage, precordial leads Baseline wander in lead(s) V2 No  significant change since last tracing Confirmed by Leida Luton,  DO, Kinisha Soper [id number]) on 02/27/2015 4:56:11 AM        Layla Maw Cleo Santucci, DO 02/27/15 6045

## 2015-02-27 NOTE — Discharge Instructions (Signed)
Pneumonia °Pneumonia is an infection of the lungs.  °CAUSES °Pneumonia may be caused by bacteria or a virus. Usually, these infections are caused by breathing infectious particles into the lungs (respiratory tract). °SIGNS AND SYMPTOMS  °· Cough. °· Fever. °· Chest pain. °· Increased rate of breathing. °· Wheezing. °· Mucus production. °DIAGNOSIS  °If you have the common symptoms of pneumonia, your health care provider will typically confirm the diagnosis with a chest X-ray. The X-ray will show an abnormality in the lung (pulmonary infiltrate) if you have pneumonia. Other tests of your blood, urine, or sputum may be done to find the specific cause of your pneumonia. Your health care provider may also do tests (blood gases or pulse oximetry) to see how well your lungs are working. °TREATMENT  °Some forms of pneumonia may be spread to other people when you cough or sneeze. You may be asked to wear a mask before and during your exam. Pneumonia that is caused by bacteria is treated with antibiotic medicine. Pneumonia that is caused by the influenza virus may be treated with an antiviral medicine. Most other viral infections must run their course. These infections will not respond to antibiotics.  °HOME CARE INSTRUCTIONS  °· Cough suppressants may be used if you are losing too much rest. However, coughing protects you by clearing your lungs. You should avoid using cough suppressants if you can. °· Your health care provider may have prescribed medicine if he or she thinks your pneumonia is caused by bacteria or influenza. Finish your medicine even if you start to feel better. °· Your health care provider may also prescribe an expectorant. This loosens the mucus to be coughed up. °· Take medicines only as directed by your health care provider. °· Do not smoke. Smoking is a common cause of bronchitis and can contribute to pneumonia. If you are a smoker and continue to smoke, your cough may last several weeks after your  pneumonia has cleared. °· A cold steam vaporizer or humidifier in your room or home may help loosen mucus. °· Coughing is often worse at night. Sleeping in a semi-upright position in a recliner or using a couple pillows under your head will help with this. °· Get rest as you feel it is needed. Your body will usually let you know when you need to rest. °PREVENTION °A pneumococcal shot (vaccine) is available to prevent a common bacterial cause of pneumonia. This is usually suggested for: °· People over 65 years old. °· Patients on chemotherapy. °· People with chronic lung problems, such as bronchitis or emphysema. °· People with immune system problems. °If you are over 65 or have a high risk condition, you may receive the pneumococcal vaccine if you have not received it before. In some countries, a routine influenza vaccine is also recommended. This vaccine can help prevent some cases of pneumonia. You may be offered the influenza vaccine as part of your care. °If you smoke, it is time to quit. You may receive instructions on how to stop smoking. Your health care provider can provide medicines and counseling to help you quit. °SEEK MEDICAL CARE IF: °You have a fever. °SEEK IMMEDIATE MEDICAL CARE IF:  °· Your illness becomes worse. This is especially true if you are elderly or weakened from any other disease. °· You cannot control your cough with suppressants and are losing sleep. °· You begin coughing up blood. °· You develop pain which is getting worse or is uncontrolled with medicines. °· Any of the symptoms   which initially brought you in for treatment are getting worse rather than better. °· You develop shortness of breath or chest pain. °MAKE SURE YOU:  °· Understand these instructions. °· Will watch your condition. °· Will get help right away if you are not doing well or get worse. °Document Released: 11/12/2005 Document Revised: 03/29/2014 Document Reviewed: 02/01/2011 °[employer]® Patient Information ©2015  [employer]. This information is not intended to replace advice given to you by your health care provider. Make sure you discuss any questions you have with your health care provider. ° °Smoking Cessation °Quitting smoking is important to your health and has many advantages. However, it is not always easy to quit since nicotine is a very addictive drug. Oftentimes, people try 3 times or more before being able to quit. This document explains the best ways for you to prepare to quit smoking. Quitting takes hard work and a lot of effort, but you can do it. °ADVANTAGES OF QUITTING SMOKING °· You will live longer, feel better, and live better. °· Your body will feel the impact of quitting smoking almost immediately. °¨ Within 20 minutes, blood pressure decreases. Your pulse returns to its normal level. °¨ After 8 hours, carbon monoxide levels in the blood return to normal. Your oxygen level increases. °¨ After 24 hours, the chance of having a heart attack starts to decrease. Your breath, hair, and body stop smelling like smoke. °¨ After 48 hours, damaged nerve endings begin to recover. Your sense of taste and smell improve. °¨ After 72 hours, the body is virtually free of nicotine. Your bronchial tubes relax and breathing becomes easier. °¨ After 2 to 12 weeks, lungs can hold more air. Exercise becomes easier and circulation improves. °· The risk of having a heart attack, stroke, cancer, or lung disease is greatly reduced. °¨ After 1 year, the risk of coronary heart disease is cut in half. °¨ After 5 years, the risk of stroke falls to the same as a nonsmoker. °¨ After 10 years, the risk of lung cancer is cut in half and the risk of other cancers decreases significantly. °¨ After 15 years, the risk of coronary heart disease drops, usually to the level of a nonsmoker. °· If you are pregnant, quitting smoking will improve your chances of having a healthy baby. °· The people you live with, especially any children, will be  healthier. °· You will have extra money to spend on things other than cigarettes. °QUESTIONS TO THINK ABOUT BEFORE ATTEMPTING TO QUIT °You may want to talk about your answers with your health care provider. °· Why do you want to quit? °· If you tried to quit in the past, what helped and what did not? °· What will be the most difficult situations for you after you quit? How will you plan to handle them? °· Who can help you through the tough times? Your family? Friends? A health care provider? °· What pleasures do you get from smoking? What ways can you still get pleasure if you quit? °Here are some questions to ask your health care provider: °· How can you help me to be successful at quitting? °· What medicine do you think would be best for me and how should I take it? °· What should I do if I need more help? °· What is smoking withdrawal like? How can I get information on withdrawal? °GET READY °· Set a quit date. °· Change your environment by getting rid of all cigarettes, ashtrays, matches, and   lighters in your home, car, or work. Do not let people smoke in your home. °· Review your past attempts to quit. Think about what worked and what did not. °GET SUPPORT AND ENCOURAGEMENT °You have a better chance of being successful if you have help. You can get support in many ways. °· Tell your family, friends, and coworkers that you are going to quit and need their support. Ask them not to smoke around you. °· Get individual, group, or telephone counseling and support. Programs are available at local hospitals and health centers. Call your local health department for information about programs in your area. °· Spiritual beliefs and practices may help some smokers quit. °· Download a "quit meter" on your computer to keep track of quit statistics, such as how long you have gone without smoking, cigarettes not smoked, and money saved. °· Get a self-help book about quitting smoking and staying off tobacco. °LEARN NEW SKILLS  AND BEHAVIORS °· Distract yourself from urges to smoke. Talk to someone, go for a walk, or occupy your time with a task. °· Change your normal routine. Take a different route to work. Drink tea instead of coffee. Eat breakfast in a different place. °· Reduce your stress. Take a hot bath, exercise, or read a book. °· Plan something enjoyable to do every day. Reward yourself for not smoking. °· Explore interactive web-based programs that specialize in helping you quit. °GET MEDICINE AND USE IT CORRECTLY °Medicines can help you stop smoking and decrease the urge to smoke. Combining medicine with the above behavioral methods and support can greatly increase your chances of successfully quitting smoking. °· Nicotine replacement therapy helps deliver nicotine to your body without the negative effects and risks of smoking. Nicotine replacement therapy includes nicotine gum, lozenges, inhalers, nasal sprays, and skin patches. Some may be available over-the-counter and others require a prescription. °· Antidepressant medicine helps people abstain from smoking, but how this works is unknown. This medicine is available by prescription. °· Nicotinic receptor partial agonist medicine simulates the effect of nicotine in your brain. This medicine is available by prescription. °Ask your health care provider for advice about which medicines to use and how to use them based on your health history. Your health care provider will tell you what side effects to look out for if you choose to be on a medicine or therapy. Carefully read the information on the package. Do not use any other product containing nicotine while using a nicotine replacement product.  °RELAPSE OR DIFFICULT SITUATIONS °Most relapses occur within the first 3 months after quitting. Do not be discouraged if you start smoking again. Remember, most people try several times before finally quitting. You may have symptoms of withdrawal because your body is used to nicotine.  You may crave cigarettes, be irritable, feel very hungry, cough often, get headaches, or have difficulty concentrating. The withdrawal symptoms are only temporary. They are strongest when you first quit, but they will go away within 10-14 days. °To reduce the chances of relapse, try to: °· Avoid drinking alcohol. Drinking lowers your chances of successfully quitting. °· Reduce the amount of caffeine you consume. Once you quit smoking, the amount of caffeine in your body increases and can give you symptoms, such as a rapid heartbeat, sweating, and anxiety. °· Avoid smokers because they can make you want to smoke. °· Do not let weight gain distract you. Many smokers will gain weight when they quit, usually less than 10 pounds. Eat a healthy   diet and stay active. You can always lose the weight gained after you quit. °· Find ways to improve your mood other than smoking. °FOR MORE INFORMATION  °www.smokefree.gov  °Document Released: 11/06/2001 Document Revised: 03/29/2014 Document Reviewed: 02/21/2012 °[employer]® Patient Information ©2015 [employer]. This information is not intended to replace advice given to you by your health care provider. Make sure you discuss any questions you have with your health care provider. ° °

## 2015-02-27 NOTE — ED Notes (Signed)
Patient is reporting cough x3 days with white frothy production. Patient is also reporting SOB, chills and emesis x3 days.

## 2016-03-11 ENCOUNTER — Emergency Department (HOSPITAL_COMMUNITY): Payer: No Typology Code available for payment source

## 2016-03-11 ENCOUNTER — Encounter (HOSPITAL_COMMUNITY): Payer: Self-pay | Admitting: *Deleted

## 2016-03-11 ENCOUNTER — Emergency Department (HOSPITAL_COMMUNITY)
Admission: EM | Admit: 2016-03-11 | Discharge: 2016-03-11 | Disposition: A | Discharge status: Home / Self Care | Payer: No Typology Code available for payment source | Attending: Emergency Medicine | Admitting: Emergency Medicine

## 2016-03-11 DIAGNOSIS — S80212A Abrasion, left knee, initial encounter: Secondary | ICD-10-CM | POA: Diagnosis not present

## 2016-03-11 DIAGNOSIS — S00212A Abrasion of left eyelid and periocular area, initial encounter: Secondary | ICD-10-CM | POA: Diagnosis not present

## 2016-03-11 DIAGNOSIS — S0012XA Contusion of left eyelid and periocular area, initial encounter: Secondary | ICD-10-CM | POA: Diagnosis not present

## 2016-03-11 DIAGNOSIS — E119 Type 2 diabetes mellitus without complications: Secondary | ICD-10-CM | POA: Insufficient documentation

## 2016-03-11 DIAGNOSIS — Y92410 Unspecified street and highway as the place of occurrence of the external cause: Secondary | ICD-10-CM | POA: Diagnosis not present

## 2016-03-11 DIAGNOSIS — Y9389 Activity, other specified: Secondary | ICD-10-CM | POA: Insufficient documentation

## 2016-03-11 DIAGNOSIS — F17210 Nicotine dependence, cigarettes, uncomplicated: Secondary | ICD-10-CM | POA: Insufficient documentation

## 2016-03-11 DIAGNOSIS — S199XXA Unspecified injury of neck, initial encounter: Secondary | ICD-10-CM | POA: Diagnosis not present

## 2016-03-11 DIAGNOSIS — S3992XA Unspecified injury of lower back, initial encounter: Secondary | ICD-10-CM | POA: Diagnosis not present

## 2016-03-11 DIAGNOSIS — S30811A Abrasion of abdominal wall, initial encounter: Secondary | ICD-10-CM | POA: Diagnosis not present

## 2016-03-11 DIAGNOSIS — Y998 Other external cause status: Secondary | ICD-10-CM | POA: Insufficient documentation

## 2016-03-11 DIAGNOSIS — F10229 Alcohol dependence with intoxication, unspecified: Secondary | ICD-10-CM | POA: Diagnosis not present

## 2016-03-11 DIAGNOSIS — S0990XA Unspecified injury of head, initial encounter: Secondary | ICD-10-CM | POA: Diagnosis present

## 2016-03-11 LAB — COMPREHENSIVE METABOLIC PANEL
ALT: 88 U/L — AB (ref 17–63)
ANION GAP: 12 (ref 5–15)
AST: 128 U/L — ABNORMAL HIGH (ref 15–41)
Albumin: 4 g/dL (ref 3.5–5.0)
Alkaline Phosphatase: 74 U/L (ref 38–126)
BUN: 7 mg/dL (ref 6–20)
CHLORIDE: 101 mmol/L (ref 101–111)
CO2: 24 mmol/L (ref 22–32)
Calcium: 8.4 mg/dL — ABNORMAL LOW (ref 8.9–10.3)
Creatinine, Ser: 0.63 mg/dL (ref 0.61–1.24)
GFR calc non Af Amer: >60 mL/min (ref >60)
Glucose, Bld: 217 mg/dL — ABNORMAL HIGH (ref 65–99)
Potassium: 3.8 mmol/L (ref 3.5–5.1)
SODIUM: 137 mmol/L (ref 135–145)
Total Bilirubin: 1.4 mg/dL — ABNORMAL HIGH (ref 0.3–1.2)
Total Protein: 8.3 g/dL — ABNORMAL HIGH (ref 6.5–8.1)

## 2016-03-11 LAB — TYPE AND SCREEN
ABO/RH(D): A POS
ANTIBODY SCREEN: NEGATIVE

## 2016-03-11 LAB — CBC WITH DIFFERENTIAL/PLATELET
BASOS PCT: 0 %
Basophils Absolute: 0 10*3/uL (ref 0.0–0.1)
EOS ABS: 0.1 10*3/uL (ref 0.0–0.7)
EOS PCT: 2 %
HCT: 40.8 % (ref 39.0–52.0)
HEMOGLOBIN: 14.1 g/dL (ref 13.0–17.0)
Lymphocytes Relative: 23 %
Lymphs Abs: 1.6 10*3/uL (ref 0.7–4.0)
MCH: 33.1 pg (ref 26.0–34.0)
MCHC: 34.6 g/dL (ref 30.0–36.0)
MCV: 95.8 fL (ref 78.0–100.0)
Monocytes Absolute: 0.5 10*3/uL (ref 0.1–1.0)
Monocytes Relative: 8 %
Neutro Abs: 4.6 10*3/uL (ref 1.7–7.7)
Neutrophils Relative %: 67 %
PLATELETS: 150 10*3/uL (ref 150–400)
RBC: 4.26 MIL/uL (ref 4.22–5.81)
RDW: 13.7 % (ref 11.5–15.5)
WBC: 6.9 10*3/uL (ref 4.0–10.5)

## 2016-03-11 LAB — PROTIME-INR
INR: 1 (ref 0.00–1.49)
Prothrombin Time: 13.4 seconds (ref 11.6–15.2)

## 2016-03-11 LAB — ETHANOL: Alcohol, Ethyl (B): 247 mg/dL — ABNORMAL HIGH (ref <5)
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
## 2016-03-11 NOTE — Discharge Instructions (Signed)

## 2016-03-11 NOTE — ED Notes (Signed)
Pt refused to change into gown for scans.

## 2016-03-11 NOTE — ED Notes (Signed)
Applied [place] Cervical Collar to patients neck.

## 2016-03-12 LAB — ABO/RH: ABO/RH(D): A POS

## 2016-04-26 ENCOUNTER — Ambulatory Visit: Payer: Self-pay | Attending: Physician Assistant | Admitting: Physician Assistant

## 2016-04-26 VITALS — BP 143/88 | HR 99 | Temp 98.2°F | Wt 227.6 lb

## 2016-04-26 DIAGNOSIS — R233 Spontaneous ecchymoses: Secondary | ICD-10-CM

## 2016-04-26 DIAGNOSIS — E0800 Diabetes mellitus due to underlying condition with hyperosmolarity without nonketotic hyperglycemic-hyperosmolar coma (NKHHC): Secondary | ICD-10-CM

## 2016-04-26 DIAGNOSIS — I10 Essential (primary) hypertension: Secondary | ICD-10-CM

## 2016-04-26 DIAGNOSIS — F1020 Alcohol dependence, uncomplicated: Secondary | ICD-10-CM

## 2016-04-26 DIAGNOSIS — R739 Hyperglycemia, unspecified: Secondary | ICD-10-CM

## 2016-04-26 LAB — GLUCOSE, POCT (MANUAL RESULT ENTRY): POC GLUCOSE: 290 mg/dL — AB (ref 70–99)

## 2016-04-26 LAB — POCT GLYCOSYLATED HEMOGLOBIN (HGB A1C): HEMOGLOBIN A1C: 8.9

## 2016-04-26 MED ORDER — GLUCOSE BLOOD VI STRP: ORAL_STRIP | Status: DC

## 2016-04-26 MED ORDER — TRUE METRIX METER W/DEVICE KIT: 1.0000 | PACK | Freq: Every day | Status: DC

## 2016-04-26 MED ORDER — LISINOPRIL 10 MG PO TABS: 10.0000 mg | ORAL_TABLET | Freq: Every day | ORAL | Status: DC

## 2016-04-26 MED ORDER — TRUEPLUS LANCETS 28G MISC: 1.0000 | Freq: Every day | Status: DC

## 2016-04-26 MED ORDER — METFORMIN HCL 1000 MG PO TABS: 1000.0000 mg | ORAL_TABLET | Freq: Two times a day (BID) | ORAL | Status: DC

## 2016-04-26 MED FILL — ?METFORMIN HCL 1,000 MG TAB: 1000 | 30 days supply | Qty: 60 | Fill #0

## 2016-04-26 MED FILL — TRUEplus LANCETS 28G MISC: 30 days supply | Qty: 100 | Fill #0

## 2016-04-26 MED FILL — TRUE METRIX TEST STRIP: 30 days supply | Qty: 100 | Fill #0

## 2016-04-26 MED FILL — LISINOPRIL 10 MG TABLET: 10 | 30 days supply | Qty: 30 | Fill #0

## 2016-04-26 NOTE — Patient Instructions (Signed)
Metformin 1000mg  1/2 tab daily X 1 week 1/2 tab twice daily X 1week 1 tab twice daily X 1 week  Check fasting blood sugars each morning and record.
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
## 2016-04-27 MED FILL — TRUE METRIX BLOOD GLUCOSE M: W/DEVICE | 1 days supply | Qty: 1 | Fill #0

## 2016-07-05 ENCOUNTER — Ambulatory Visit: Payer: Self-pay | Attending: Internal Medicine | Admitting: Physician Assistant

## 2016-07-05 VITALS — BP 149/99 | HR 94 | Temp 98.3°F | Resp 16 | Wt 231.4 lb

## 2016-07-05 DIAGNOSIS — E0800 Diabetes mellitus due to underlying condition with hyperosmolarity without nonketotic hyperglycemic-hyperosmolar coma (NKHHC): Secondary | ICD-10-CM

## 2016-07-05 DIAGNOSIS — I10 Essential (primary) hypertension: Secondary | ICD-10-CM

## 2016-07-05 DIAGNOSIS — F1010 Alcohol abuse, uncomplicated: Secondary | ICD-10-CM

## 2016-07-05 NOTE — Progress Notes (Signed)
Pt is in the office today for a follow up Pt states his pain level is a 6 today Pt states his pain is coming from his ankles, joints, knees and back

## 2016-07-05 NOTE — Progress Notes (Signed)
Patient ID: Adrian Schultz, male   DOB: [birth year], 50 y.o.   MRN: [id number]   Adrian Schultz, is a 50 y.o. male  PJA:[id number]  HAL:[id number]  DOB - [birth year]  Subjective:  Chief Complaint and HPI: Adrian Schultz is a 50 y.o. male here today Efor a f/up on diabetes, htn, and alcohol dependence.  He has cut down to a 12 pack of beer daily.  He says he has been checking his sugars and has been getting readings of 130-150.  He did not bring his meter or log of his readings in.  He has been to [facility] before for Detox and still has a Social worker he has access to there.  He expresses that he knows he needs to stop drinking but can't on his own.  He says he is compliant on his metformin but not on Lisinopril.   ROS:   Constitutional:  No f/c, No night sweats, No unexplained weight loss. EENT:  No vision changes, No blurry vision, No hearing changes. No mouth, throat, or ear problems.  Respiratory: No cough, No SOB Cardiac: No CP, no palpitations GI:  No abd pain, No N/V/D. GU: No Urinary s/sx Musculoskeletal: +generalized body aches; esp lower extremities.  Neuro: No headache, no dizziness, no motor weakness.  Skin: +same petechial rash Endocrine:  No polydipsia. No polyuria.  Psych: Denies SI/HI  No problems updated.  ALLERGIES: No Known Allergies  PAST MEDICAL HISTORY: Past Medical History:  Diagnosis Date  . Diabetes mellitus without complication ([place])   . ETOH abuse   . Suicidal ideation     MEDICATIONS AT HOME: Prior to Admission medications   Medication Sig Start Date End Date Taking? Authorizing Provider  Blood Glucose Monitoring Suppl (TRUE METRIX METER) w/Device KIT 1 each by Does not apply route daily. 04/26/16  Yes Maren Reamer, MD  glucose blood (TRUE METRIX BLOOD GLUCOSE TEST) test strip Use as instructed 04/26/16  Yes Dawn T Janne Napoleon, MD  lisinopril (PRINIVIL,ZESTRIL) 10 MG tablet Take 1 tablet (10 mg total) by mouth daily. 04/26/16  Yes Argentina Donovan, PA-C  metFORMIN  (GLUCOPHAGE) 1000 MG tablet Take 1 tablet (1,000 mg total) by mouth 2 (two) times daily with a meal. 04/26/16  Yes Argentina Donovan, PA-C  TRUEPLUS LANCETS 28G MISC 1 each by Does not apply route daily. 04/26/16  Yes Maren Reamer, MD     Objective:  EXAM:   Vitals:   07/05/16 1434 07/05/16 1435  BP:  (!) 149/99  Pulse:  94  Resp:  16  Temp:  98.3 F (36.8 C)  TempSrc:  Oral  SpO2:  92%  Weight: 231 lb 6.4 oz (105 kg) 231 lb 6.4 oz (105 kg)    General appearance : A&OX3. NAD. Non-toxic-appearing HEENT: Atraumatic and Normocephalic.  PERRLA. EOM intact.  TM clear B. Mouth-MMM, post pharynx WNL w/o erythema, No PND. Neck: supple, no JVD. No cervical lymphadenopathy. No thyromegaly Chest/Lungs:  Breathing-non-labored, Good air entry bilaterally, breath sounds normal without rales, rhonchi, or wheezing  CVS: S1 S2 regular, no murmurs, gallops, rubs  Extremities: Bilateral Lower Ext shows no edema, both legs are warm to touch with = pulse throughout Neurology:  CN II-XII grossly intact, Non focal.   Psych:  TP linear. J/I WNL. Normal speech. Appropriate eye contact and affect.  Skin:  +petechial rash has improved somewhat since last visit, but still present on B arms and lower legs.  Data Review Lab Results  Component Value Date   HGBA1C  8.9 04/26/2016     Assessment & Plan   1. Diabetes mellitus due to underlying condition with hyperosmolarity without coma, without long-term current use of insulin (HCC) Continue meds as directed.  Compliance is questionable due to continued alcohol abuse.  I doubt we will make much progress of his health until the underlying alcoholism is treated.  I gave him resources on 12 step recovery, contacts in AA, and he has information on [facility] for treatment. Labs at next appointment.  He wanted to defer labs today due to cost.   2. Essential hypertension Non-compliance with Lisinopril.  Take Lisinopril.  3. Alcohol abuse  I doubt we will make  much progress of his health until the underlying alcoholism is treated.  I gave him resources on 12 step recovery, contacts in AA, and he has information on [facility] for treatment. Labs at next appointment.  He wanted to defer labs today due to cost.   Patient have been counseled extensively about nutrition and exercise  Return in about 4 weeks (around 08/02/2016) for f/up with me; diabetes, HBP.  The patient was given clear instructions to go to ER or return to medical center if symptoms don't improve, worsen or new problems develop. The patient verbalized understanding. The patient was told to call to get lab results if they haven't heard anything in the next week.     Freeman Caldron, PA-C [facility], [place]   07/05/2016, 4:09 PM

## 2016-12-20 ENCOUNTER — Ambulatory Visit: Payer: Self-pay | Admitting: Family Medicine

## 2016-12-24 ENCOUNTER — Encounter: Payer: Self-pay | Admitting: Pediatric Intensive Care

## 2016-12-27 NOTE — Congregational Nurse Program (Signed)
Congregational Nurse Program Note  Date of Encounter: 12/20/2016  Past Medical History: Past Medical History:  Diagnosis Date  . Diabetes mellitus without complication (HCC)   . ETOH abuse   . Suicidal ideation     Encounter Details:     CNP Questionnaire - 12/19/16 1015      Patient Demographics   Is this a new or existing patient? New   Patient is considered a/an Not Applicable   Race Caucasian/White     Patient Assistance   Location of Patient Assistance Not Applicable   Patient's financial/insurance status Low Income;Self-Pay (Uninsured)   Uninsured Patient ([place] Card/Care Connects) Yes   Interventions Assisted patient in making appt.;Averted from ED/Urgent Care   Patient referred to apply for the following financial assistance Not Applicable   Food insecurities addressed Not Applicable   Transportation assistance Yes   Type of Assistance Bus Pass Given   Assistance securing medications No   Educational health offerings Acute disease     Encounter Details   Primary purpose of visit Acute Illness/Condition Visit   Was an Emergency Department visit averted? Yes   Does patient have a medical provider? Yes   Patient referred to Establish PCP   Was a mental health screening completed? (GAINS tool) No   Does patient have dental issues? No   Does patient have vision issues? No   Does your patient have an abnormal blood pressure today? No   Since previous encounter, have you referred patient for abnormal blood pressure that resulted in a new diagnosis or medication change? No   Does your patient have an abnormal blood glucose today? No   Since previous encounter, have you referred patient for abnormal blood glucose that resulted in a new diagnosis or medication change? No   Was there a life-saving intervention made? No     Was seen at the [facility].  Client was coughing with notable chest congestion.  [facility]  Bus passes provided.  According to record, client never showed for appointment

## 2017-01-05 NOTE — Congregational Nurse Program (Signed)
Congregational Nurse Program Note  Date of Encounter: 12/31/2016  Past Medical History: Past Medical History:  Diagnosis Date  . Diabetes mellitus without complication (HCC)   . ETOH abuse   . Suicidal ideation     Encounter Details:     CNP Questionnaire - 12/31/16 1928      Patient Demographics   Is this a new or existing patient? New   Patient is considered a/an Not Applicable   Race African-American/Black     Patient Assistance   Location of Patient Assistance Not Applicable   Patient's financial/insurance status Low Income;Self-Pay (Uninsured)   Uninsured Patient ([place] Card/Care Connects) Yes   Interventions Assisted patient in making appt.   Patient referred to apply for the following financial assistance [employer]/Care Connects Renewal   Food insecurities addressed Not Applicable   Transportation assistance No   Assistance securing medications No   Educational health offerings Behavioral health;Chronic disease     Encounter Details   Primary purpose of visit Chronic Illness/Condition Visit;Navigating the Healthcare System   Was an Emergency Department visit averted? Not Applicable   Does patient have a medical provider? Yes   Patient referred to Clinic   Was a mental health screening completed? (GAINS tool) No   Does patient have dental issues? No   Does patient have vision issues? No   Does your patient have an abnormal blood pressure today? No   Since previous encounter, have you referred patient for abnormal blood pressure that resulted in a new diagnosis or medication change? No   Does your patient have an abnormal blood glucose today? No   Since previous encounter, have you referred patient for abnormal blood glucose that resulted in a new diagnosis or medication change? No   Was there a life-saving intervention made? No     Multiple physical and behavioral health issues.  Has [facility].  States relapsed  (alcoholism).  Provided support and encouraged him to continue with his sobriety program.  Encouraged to see me for ongoing support

## 2017-01-12 NOTE — Congregational Nurse Program (Signed)
Congregational Nurse Program Note  Date of Encounter: 12/24/2016  Past Medical History: Past Medical History:  Diagnosis Date  . Diabetes mellitus without complication (HCC)   . ETOH abuse   . Suicidal ideation     Encounter Details:     CNP Questionnaire - 01/12/17 1551      Patient Demographics   Is this a new or existing patient? New   Patient is considered a/an Not Applicable   Race African-American/Black     Patient Assistance   Location of Patient Assistance [facility]   Patient's financial/insurance status Self-Pay (Uninsured)   Uninsured Patient ([place] Card/Care Connects) Yes   Interventions Assisted patient in making appt.   Patient referred to apply for the following financial assistance [employer] Card/Care Connects Renewal   Food insecurities addressed Not Applicable   Transportation assistance No   Type of Assistance Other   Assistance securing medications No   Product/process development scientistducational health offerings Navigating the healthcare system     Encounter Details   Primary purpose of visit Navigating the Healthcare System;Acute Illness/Condition Visit   Was an Emergency Department visit averted? Not Applicable   Does patient have a medical provider? Yes   Patient referred to Follow up with established PCP   Was a mental health screening completed? (GAINS tool) No   Does patient have dental issues? No   Does patient have vision issues? No   Does your patient have an abnormal blood pressure today? No   Since previous encounter, have you referred patient for abnormal blood pressure that resulted in a new diagnosis or medication change? No   Does your patient have an abnormal blood glucose today? No   Since previous encounter, have you referred patient for abnormal blood glucose that resulted in a new diagnosis or medication change? No   Was there a life-saving intervention made? No     URI symptoms. [facility]

## 2017-03-16 ENCOUNTER — Emergency Department (HOSPITAL_COMMUNITY): Payer: Self-pay

## 2017-03-16 ENCOUNTER — Encounter (HOSPITAL_COMMUNITY): Payer: Self-pay

## 2017-03-16 ENCOUNTER — Emergency Department (HOSPITAL_COMMUNITY)
Admission: EM | Admit: 2017-03-16 | Discharge: 2017-03-16 | Disposition: A | Discharge status: Home / Self Care | Payer: Self-pay | Attending: Emergency Medicine | Admitting: Emergency Medicine

## 2017-03-16 DIAGNOSIS — Z79899 Other long term (current) drug therapy: Secondary | ICD-10-CM | POA: Insufficient documentation

## 2017-03-16 DIAGNOSIS — M791 Myalgia, unspecified site: Secondary | ICD-10-CM

## 2017-03-16 DIAGNOSIS — Z87891 Personal history of nicotine dependence: Secondary | ICD-10-CM | POA: Insufficient documentation

## 2017-03-16 DIAGNOSIS — B349 Viral infection, unspecified: Secondary | ICD-10-CM | POA: Insufficient documentation

## 2017-03-16 DIAGNOSIS — E119 Type 2 diabetes mellitus without complications: Secondary | ICD-10-CM | POA: Insufficient documentation

## 2017-03-16 DIAGNOSIS — Z7984 Long term (current) use of oral hypoglycemic drugs: Secondary | ICD-10-CM | POA: Insufficient documentation

## 2017-03-16 LAB — URINALYSIS, ROUTINE W REFLEX MICROSCOPIC
Bacteria, UA: NONE SEEN
Bilirubin Urine: NEGATIVE
KETONES UR: 80 mg/dL — AB
LEUKOCYTES UA: NEGATIVE
NITRITE: NEGATIVE
PH: 5 (ref 5.0–8.0)
Protein, ur: 30 mg/dL — AB
SPECIFIC GRAVITY, URINE: 1.031 — AB (ref 1.005–1.030)

## 2017-03-16 LAB — CBC WITH DIFFERENTIAL/PLATELET
BASOS PCT: 0 %
Basophils Absolute: 0 10*3/uL (ref 0.0–0.1)
EOS ABS: 0.1 10*3/uL (ref 0.0–0.7)
Eosinophils Relative: 0 %
HEMATOCRIT: 42.5 % (ref 39.0–52.0)
Hemoglobin: 15 g/dL (ref 13.0–17.0)
LYMPHS ABS: 1.5 10*3/uL (ref 0.7–4.0)
Lymphocytes Relative: 9 %
MCH: 33 pg (ref 26.0–34.0)
MCHC: 35.3 g/dL (ref 30.0–36.0)
MCV: 93.4 fL (ref 78.0–100.0)
MONO ABS: 0.9 10*3/uL (ref 0.1–1.0)
MONOS PCT: 5 %
Neutro Abs: 14.3 10*3/uL — ABNORMAL HIGH (ref 1.7–7.7)
Neutrophils Relative %: 86 %
Platelets: 195 10*3/uL (ref 150–400)
RBC: 4.55 MIL/uL (ref 4.22–5.81)
RDW: 12.9 % (ref 11.5–15.5)
WBC: 16.8 10*3/uL — ABNORMAL HIGH (ref 4.0–10.5)

## 2017-03-16 LAB — COMPREHENSIVE METABOLIC PANEL
ALBUMIN: 3.6 g/dL (ref 3.5–5.0)
ALK PHOS: 111 U/L (ref 38–126)
ALT: 33 U/L (ref 17–63)
ANION GAP: 11 (ref 5–15)
AST: 29 U/L (ref 15–41)
BILIRUBIN TOTAL: 2.2 mg/dL — AB (ref 0.3–1.2)
BUN: 10 mg/dL (ref 6–20)
CALCIUM: 9 mg/dL (ref 8.9–10.3)
CO2: 24 mmol/L (ref 22–32)
Chloride: 94 mmol/L — ABNORMAL LOW (ref 101–111)
Creatinine, Ser: 0.69 mg/dL (ref 0.61–1.24)
GFR calc non Af Amer: >60 mL/min (ref >60)
GLUCOSE: 306 mg/dL — AB (ref 65–99)
POTASSIUM: 4 mmol/L (ref 3.5–5.1)
Sodium: 129 mmol/L — ABNORMAL LOW (ref 135–145)
TOTAL PROTEIN: 8 g/dL (ref 6.5–8.1)

## 2017-03-16 LAB — I-STAT CHEM 8, ED
BUN: 10 mg/dL (ref 6–20)
CALCIUM ION: 1.06 mmol/L — AB (ref 1.15–1.40)
CHLORIDE: 95 mmol/L — AB (ref 101–111)
Creatinine, Ser: 0.5 mg/dL — ABNORMAL LOW (ref 0.61–1.24)
GLUCOSE: 310 mg/dL — AB (ref 65–99)
HCT: 45 % (ref 39.0–52.0)
Hemoglobin: 15.3 g/dL (ref 13.0–17.0)
Potassium: 4.1 mmol/L (ref 3.5–5.1)
SODIUM: 131 mmol/L — AB (ref 135–145)
TCO2: 25 mmol/L (ref 0–100)

## 2017-03-16 LAB — CK: Total CK: 22 U/L — ABNORMAL LOW (ref 49–397)

## 2017-03-16 LAB — I-STAT CG4 LACTIC ACID, ED: LACTIC ACID, VENOUS: 1.39 mmol/L (ref 0.5–1.9)

## 2017-03-16 MED ORDER — IBUPROFEN 800 MG PO TABS: 800.0000 mg | ORAL_TABLET | Freq: Three times a day (TID) | ORAL | 0 refills | Status: DC

## 2017-03-16 MED ORDER — SODIUM CHLORIDE 0.9 % IV SOLN: Freq: Once | INTRAVENOUS | Status: DC

## 2017-03-16 MED ORDER — SODIUM CHLORIDE 0.9 % IV BOLUS (SEPSIS)
1000.0000 mL | Freq: Once | INTRAVENOUS | Status: AC
  Administered 2017-03-16: 1000 mL via INTRAVENOUS

## 2017-03-16 MED ORDER — METFORMIN HCL 1000 MG PO TABS: 1000.0000 mg | ORAL_TABLET | Freq: Two times a day (BID) | ORAL | 0 refills | Status: DC

## 2017-03-16 MED ORDER — FENTANYL CITRATE (PF) 100 MCG/2ML IJ SOLN
50.0000 ug | INTRAMUSCULAR | Status: DC | PRN
  Administered 2017-03-16: 50 ug via INTRAVENOUS
  Filled 2017-03-16: qty 2

## 2017-03-16 MED ORDER — LISINOPRIL 10 MG PO TABS: 10.0000 mg | ORAL_TABLET | Freq: Every day | ORAL | 0 refills | Status: DC

## 2017-03-16 MED ORDER — KETOROLAC TROMETHAMINE 30 MG/ML IJ SOLN
30.0000 mg | Freq: Once | INTRAMUSCULAR | Status: AC
  Administered 2017-03-16: 30 mg via INTRAVENOUS
  Filled 2017-03-16: qty 1

## 2017-03-16 MED ORDER — ACETAMINOPHEN 500 MG PO TABS
1000.0000 mg | ORAL_TABLET | Freq: Once | ORAL | Status: AC
  Administered 2017-03-16: 1000 mg via ORAL
  Filled 2017-03-16: qty 2

## 2017-03-16 MED ORDER — ONDANSETRON HCL 4 MG/2ML IJ SOLN
4.0000 mg | Freq: Once | INTRAMUSCULAR | Status: AC
  Administered 2017-03-16: 4 mg via INTRAVENOUS
  Filled 2017-03-16: qty 2

## 2017-03-16 NOTE — ED Triage Notes (Signed)
Pt BIB [facility] c/o generalized body aches x3 days. Additionally, EMS got a CBG of 306 and he states that he is not compliant with his Metformin. Pt has a hx of ETOH abuse and reports drinking 2 beers today around 2pm. A&Ox4.

## 2017-03-16 NOTE — ED Provider Notes (Signed)
[place] DEPT Provider Note   CSN: [id number] Arrival date & time: 03/16/17  2052     History   Chief Complaint Chief Complaint  Patient presents with  . Generalized Body Aches    HPI Adrian Schultz is a 51 y.o. male. Chief complaint is "I hurt all over".  HPI:  51 year old male. History of hypertension and diabetes. Noncompliant with metformin and lisinopril. He describes not feeling well for 3 days. He states that "everything hurts". He denies headache. He has pain around his ribs and has "muscles in my belly". He has had a cough. Has pain in his abdomen when he coughs. Both of his forearms and upper arms are painful. He has pain in both thighs. He is uncomfortable with walking. Is not had any dark urine. Cc is felt hot and cold but has not documented a temperature at home. His neck and back hurt, but he does not feel stiff with his neck. No rash.  Past Medical History:  Diagnosis Date  . Diabetes mellitus without complication ([place])   . ETOH abuse   . Suicidal ideation     Patient Active Problem List   Diagnosis Date Noted  . Intoxication 07/09/2013    Past Surgical History:  Procedure Laterality Date  . FOOT SURGERY    . KNEE SURGERY         Home Medications    Prior to Admission medications   Medication Sig Start Date End Date Taking? Authorizing Provider  Blood Glucose Monitoring Suppl (TRUE METRIX METER) w/Device KIT 1 each by Does not apply route daily. 04/26/16   Maren Reamer, MD  glucose blood (TRUE METRIX BLOOD GLUCOSE TEST) test strip Use as instructed 04/26/16   Maren Reamer, MD  ibuprofen (ADVIL,MOTRIN) 800 MG tablet Take 1 tablet (800 mg total) by mouth 3 (three) times daily. 03/16/17   Tanna Furry, MD  lisinopril (PRINIVIL) 10 MG tablet Take 1 tablet (10 mg total) by mouth daily. 03/16/17   Tanna Furry, MD  metFORMIN (GLUCOPHAGE) 1000 MG tablet Take 1 tablet (1,000 mg total) by mouth 2 (two) times daily. 03/16/17   Tanna Furry, MD  TRUEPLUS LANCETS  28G MISC 1 each by Does not apply route daily. 04/26/16   Maren Reamer, MD    Family History History reviewed. No pertinent family history.  Social History Social History  Substance Use Topics  . Smoking status: Former Smoker    Types: Cigarettes  . Smokeless tobacco: Current User    Types: Snuff     Comment: daily since 51 years old  . Alcohol use 0.0 oz/week     Comment: beer - daily 1/2 a case      Allergies   Patient has no known allergies.   Review of Systems Review of Systems  Constitutional: Positive for activity change, chills, fatigue and fever. Negative for appetite change and diaphoresis.  HENT: Negative for mouth sores, sore throat and trouble swallowing.   Eyes: Negative for visual disturbance.  Respiratory: Positive for cough. Negative for chest tightness, shortness of breath and wheezing.   Cardiovascular: Negative for chest pain.  Gastrointestinal: Positive for abdominal pain. Negative for abdominal distention, diarrhea, nausea and vomiting.       Pain  in abdomen with coughing.   Endocrine: Negative for polydipsia, polyphagia and polyuria.  Genitourinary: Negative for dysuria, frequency and hematuria.  Musculoskeletal: Positive for arthralgias, back pain, myalgias and neck pain. Negative for gait problem.  Skin: Negative for color change, pallor  and rash.  Neurological: Positive for weakness. Negative for dizziness, syncope, light-headedness and headaches.  Hematological: Does not bruise/bleed easily.  Psychiatric/Behavioral: Negative for behavioral problems and confusion.     Physical Exam Updated Vital Signs BP (!) 154/86 (BP Location: Right Arm)   Pulse 93   Temp 99.6 F (37.6 C) (Oral)   Resp 18   SpO2 96%   Physical Exam  Constitutional: He is oriented to person, place, and time. He appears well-developed and well-nourished. No distress.  Patient minimally symptomatically fine. With movements of his arms neck legs or with cough he complains  of pain to his musculature.  HENT:  Head: Normocephalic.  Eyes: Conjunctivae are normal. Pupils are equal, round, and reactive to light. No scleral icterus.  Anicteric. No conjunctival injection.  Neck: Normal range of motion. Neck supple. No thyromegaly present.  He has pain with movement or rotation of his neck. He is not stiff or meningismus  Cardiovascular: Normal rate and regular rhythm.  Exam reveals no gallop and no friction rub.   No murmur heard. Pulmonary/Chest: Effort normal and breath sounds normal. No respiratory distress. He has no wheezes. He has no rales.  No wheezing rales rhonchi or abnormal breath sounds  Abdominal: Soft. Bowel sounds are normal. He exhibits no distension. There is no tenderness. There is no rebound.  He complains of pain in his abdomen he holds his abdomen of coughing. But he has no pain to palpate.  Musculoskeletal: Normal range of motion.  Tenderness to the muscular service arms and legs and back. Negative Kernig's.  Neurological: He is alert and oriented to person, place, and time.  Skin: Skin is warm and dry. No rash noted.  No skin rash  Psychiatric: He has a normal mood and affect. His behavior is normal.     ED Treatments / Results  Labs (all labs ordered are listed, but only abnormal results are displayed) Labs Reviewed  CBC WITH DIFFERENTIAL/PLATELET - Abnormal; Notable for the following:       Result Value   WBC 16.8 (*)    Neutro Abs 14.3 (*)    All other components within normal limits  COMPREHENSIVE METABOLIC PANEL - Abnormal; Notable for the following:    Sodium 129 (*)    Chloride 94 (*)    Glucose, Bld 306 (*)    Total Bilirubin 2.2 (*)    All other components within normal limits  URINALYSIS, ROUTINE W REFLEX MICROSCOPIC - Abnormal; Notable for the following:    Specific Gravity, Urine 1.031 (*)    Glucose, UA >=500 (*)    Hgb urine dipstick MODERATE (*)    Ketones, ur 80 (*)    Protein, ur 30 (*)    Squamous Epithelial  / LPF 0-5 (*)    All other components within normal limits  CK - Abnormal; Notable for the following:    Total CK 22 (*)    All other components within normal limits  I-STAT CHEM 8, ED - Abnormal; Notable for the following:    Sodium 131 (*)    Chloride 95 (*)    Creatinine, Ser 0.50 (*)    Glucose, Bld 310 (*)    Calcium, Ion 1.06 (*)    All other components within normal limits  I-STAT CG4 LACTIC ACID, ED    EKG  EKG Interpretation None       Radiology Dg Chest Port 1 View  Result Date: 03/16/2017 CLINICAL DATA:  Diffuse myalgias for the past 3 days.  Ex-smoker. EXAM: PORTABLE CHEST 1 VIEW COMPARISON:  03/11/2016. FINDINGS: The cardiac silhouette remains borderline enlarged. Clear lungs with stable mildly prominent interstitial markings. Old, healed left seventh rib fracture. No acute bony abnormality. IMPRESSION: Stable borderline cardiomegaly and mild chronic interstitial lung disease. No acute abnormality. Electronically Signed   By: Claudie Revering M.D.   On: 03/16/2017 21:31    Procedures Procedures (including critical care time)  Medications Ordered in ED Medications  0.9 %  sodium chloride infusion (not administered)  fentaNYL (SUBLIMAZE) injection 50 mcg (50 mcg Intravenous Given 03/16/17 2129)  ketorolac (TORADOL) 30 MG/ML injection 30 mg (not administered)  sodium chloride 0.9 % bolus 1,000 mL (1,000 mLs Intravenous New Bag/Given 03/16/17 2129)  acetaminophen (TYLENOL) tablet 1,000 mg (1,000 mg Oral Given 03/16/17 2129)  ondansetron (ZOFRAN) injection 4 mg (4 mg Intravenous Given 03/16/17 2129)     Initial Impression / Assessment and Plan / ED Course  I have reviewed the triage vital signs and the nursing notes.  Pertinent labs & imaging results that were available during my care of the patient were reviewed by me and considered in my medical decision making (see chart for details).     Recheck temp 9101.7. Concern for rhabdomyolysis with his diffuse muscle and  body aches. No obvious source of infection clinically. Normal pharynx. Clear lungs. Not tachycardic, hypoxemic,. Plan chest x-ray, labs including CPK lactate. Patient does not have meningismus or headache thus doubt meningitis. No skin rash. Plan fluids, pain meds, evaluation with labs and studies as above. Clinical reevaluation.  Final Clinical Impressions(s) / ED Diagnoses   Final diagnoses:  Viral syndrome  Myalgia    On reexam patient states he feels "great" he is able to sit and stand. He is ambulated. CPK normal. Hyperglycemic but normal renal function. The neck without difficulty. Still no concern for meningismus. Likely viral syndrome. Plan home, push fluids. Limited amount of 12 Motrin only. Prescription 1 month for each of his lisinopril and metformin. Encouraged to follow-up with primary care. Return to ER with acute changes.   New Prescriptions New Prescriptions   IBUPROFEN (ADVIL,MOTRIN) 800 MG TABLET    Take 1 tablet (800 mg total) by mouth 3 (three) times daily.   LISINOPRIL (PRINIVIL) 10 MG TABLET    Take 1 tablet (10 mg total) by mouth daily.   METFORMIN (GLUCOPHAGE) 1000 MG TABLET    Take 1 tablet (1,000 mg total) by mouth 2 (two) times daily.     Tanna Furry, MD 03/16/17 2253

## 2017-03-16 NOTE — Discharge Instructions (Signed)
Push fluids. Morin every 8 hours as needed for muscle aches.

## 2017-03-16 NOTE — ED Notes (Signed)
MD at bedside. 

## 2017-03-16 NOTE — ED Notes (Signed)
Bed: JY78 Expected date:  Expected time:  Means of arrival:  Comments: 57 M hyperglycemia

## 2017-03-21 ENCOUNTER — Ambulatory Visit: Payer: Self-pay | Attending: Internal Medicine

## 2017-03-21 NOTE — Congregational Nurse Program (Signed)
Congregational Nurse Program Note  Date of Encounter: 03/04/2017  Past Medical History: Past Medical History:  Diagnosis Date  . Diabetes mellitus without complication (HCC)   . ETOH abuse   . Suicidal ideation     Encounter Details:     CNP Questionnaire - 03/04/17 1502      Patient Demographics   Is this a new or existing patient? Existing   Patient is considered a/an Not Applicable   Race African-American/Black     Patient Assistance   Location of Patient Assistance [place]   Patient's financial/insurance status Self-Pay (Uninsured)   Uninsured Patient ([place] Card/Care Connects) Yes   Interventions Counseled to make appt. with provider   Patient referred to apply for the following financial assistance [employer] Card/Care [employer] insecurities addressed Not Applicable   Transportation assistance No   Assistance securing medications No   Product/process development scientist the healthcare system     Encounter Details   Primary purpose of visit Navigating the Healthcare System;Acute Illness/Condition Visit   Was an Emergency Department visit averted? Not Applicable   Does patient have a medical provider? Yes   Patient referred to Follow up with established PCP   Was a mental health screening completed? (GAINS tool) No   Does patient have dental issues? No   Does patient have vision issues? No   Does your patient have an abnormal blood pressure today? No   Since previous encounter, have you referred patient for abnormal blood pressure that resulted in a new diagnosis or medication change? No   Does your patient have an abnormal blood glucose today? No   Since previous encounter, have you referred patient for abnormal blood glucose that resulted in a new diagnosis or medication change? No   Was there a life-saving intervention made? No     Client requested assistance with obtaining records from [facility].  States he has made request and  had not received word and he needs the records for an upcoming disability hearing..  TC to [facility] with client on the speaker phone.  The attendant informed the client she would investigate the matter and get back to him.

## 2017-04-05 ENCOUNTER — Encounter: Payer: Self-pay | Admitting: Family Medicine

## 2017-04-05 ENCOUNTER — Ambulatory Visit: Payer: Self-pay | Attending: Family Medicine | Admitting: Family Medicine

## 2017-04-05 VITALS — BP 134/76 | HR 93 | Temp 98.2°F | Resp 18 | Ht 66.0 in | Wt 194.4 lb

## 2017-04-05 DIAGNOSIS — I10 Essential (primary) hypertension: Secondary | ICD-10-CM | POA: Insufficient documentation

## 2017-04-05 DIAGNOSIS — M25511 Pain in right shoulder: Secondary | ICD-10-CM | POA: Insufficient documentation

## 2017-04-05 DIAGNOSIS — G8929 Other chronic pain: Secondary | ICD-10-CM | POA: Insufficient documentation

## 2017-04-05 DIAGNOSIS — J209 Acute bronchitis, unspecified: Secondary | ICD-10-CM | POA: Insufficient documentation

## 2017-04-05 DIAGNOSIS — R739 Hyperglycemia, unspecified: Secondary | ICD-10-CM

## 2017-04-05 DIAGNOSIS — E119 Type 2 diabetes mellitus without complications: Secondary | ICD-10-CM

## 2017-04-05 DIAGNOSIS — Z7984 Long term (current) use of oral hypoglycemic drugs: Secondary | ICD-10-CM | POA: Insufficient documentation

## 2017-04-05 DIAGNOSIS — E1165 Type 2 diabetes mellitus with hyperglycemia: Secondary | ICD-10-CM | POA: Insufficient documentation

## 2017-04-05 DIAGNOSIS — J069 Acute upper respiratory infection, unspecified: Secondary | ICD-10-CM | POA: Insufficient documentation

## 2017-04-05 LAB — POCT GLYCOSYLATED HEMOGLOBIN (HGB A1C): Hemoglobin A1C: 10.5

## 2017-04-05 LAB — GLUCOSE, POCT (MANUAL RESULT ENTRY): POC GLUCOSE: 278 mg/dL — AB (ref 70–99)

## 2017-04-05 MED ORDER — LISINOPRIL 10 MG PO TABS: 10.0000 mg | ORAL_TABLET | Freq: Every day | ORAL | 3 refills | Status: DC

## 2017-04-05 MED ORDER — MELOXICAM 7.5 MG PO TABS: 7.5000 mg | ORAL_TABLET | Freq: Every day | ORAL | 1 refills | Status: DC

## 2017-04-05 MED ORDER — GUAIFENESIN-DM 100-10 MG/5ML PO SYRP: 5.0000 mL | ORAL_SOLUTION | ORAL | 0 refills | Status: DC | PRN

## 2017-04-05 MED ORDER — LISINOPRIL 10 MG PO TABS: 10.0000 mg | ORAL_TABLET | Freq: Every day | ORAL | 0 refills | Status: DC

## 2017-04-05 MED ORDER — METFORMIN HCL 1000 MG PO TABS: 1000.0000 mg | ORAL_TABLET | Freq: Two times a day (BID) | ORAL | 0 refills | Status: DC

## 2017-04-05 MED ORDER — TRUEPLUS LANCETS 28G MISC: 1.0000 | Freq: Every day | 11 refills | Status: DC

## 2017-04-05 MED ORDER — AZITHROMYCIN 250 MG PO TABS: ORAL_TABLET | ORAL | 0 refills | Status: DC

## 2017-04-05 MED ORDER — GLUCOSE BLOOD VI STRP: ORAL_STRIP | 12 refills | Status: DC

## 2017-04-05 MED ORDER — METFORMIN HCL 1000 MG PO TABS: 1000.0000 mg | ORAL_TABLET | Freq: Two times a day (BID) | ORAL | 3 refills | Status: DC

## 2017-04-05 MED FILL — MELOXICAM 7.5 MG TABLET: 7.5 | 30 days supply | Qty: 30 | Fill #0

## 2017-04-05 MED FILL — TRUEplus LANCETS 28G MISC: 30 days supply | Qty: 100 | Fill #0

## 2017-04-05 MED FILL — ?METFORMIN HCL 1,000 MG TAB: 1000 | 30 days supply | Qty: 60 | Fill #0

## 2017-04-05 MED FILL — ?LISINOPRIL 10 MG TABLET: 10 | 30 days supply | Qty: 30 | Fill #0

## 2017-04-05 MED FILL — TRUE METRIX TEST STRIP: 30 days supply | Qty: 100 | Fill #0

## 2017-04-05 MED FILL — AZITHROMYCIN 250 MG TABLET: 250 | 5 days supply | Qty: 6 | Fill #0

## 2017-04-05 MED FILL — ROBAFEN-DM SYRUP: 100-10 | 4 days supply | Qty: 118 | Fill #0

## 2017-04-05 NOTE — Patient Instructions (Signed)
Upper Respiratory Infection, Adult Most upper respiratory infections (URIs) are caused by a virus. A URI affects the nose, throat, and upper air passages. The most common type of URI is often called "the common cold." Follow these instructions at home:  Take medicines only as told by your doctor.  Gargle warm saltwater or take cough drops to comfort your throat as told by your doctor.  Use a warm mist humidifier or inhale steam from a shower to increase air moisture. This may make it easier to breathe.  Drink enough fluid to keep your pee (urine) clear or pale yellow.  Eat soups and other clear broths.  Have a healthy diet.  Rest as needed.  Go back to work when your fever is gone or your doctor says it is okay.  You may need to stay home longer to avoid giving your URI to others.  You can also wear a face mask and wash your hands often to prevent spread of the virus.  Use your inhaler more if you have asthma.  Do not use any tobacco products, including cigarettes, chewing tobacco, or electronic cigarettes. If you need help quitting, ask your doctor. Contact a doctor if:  You are getting worse, not better.  Your symptoms are not helped by medicine.  You have chills.  You are getting more short of breath.  You have brown or red mucus.  You have yellow or brown discharge from your nose.  You have pain in your face, especially when you bend forward.  You have a fever.  You have puffy (swollen) neck glands.  You have pain while swallowing.  You have white areas in the back of your throat. Get help right away if:  You have very bad or constant:  Headache.  Ear pain.  Pain in your forehead, behind your eyes, and over your cheekbones (sinus pain).  Chest pain.  You have long-lasting (chronic) lung disease and any of the following:  Wheezing.  Long-lasting cough.  Coughing up blood.  A change in your usual mucus.  You have a stiff neck.  You have  changes in your:  Vision.  Hearing.  Thinking.  Mood. This information is not intended to replace advice given to you by your health care provider. Make sure you discuss any questions you have with your health care provider. Document Released: 04/30/2008 Document Revised: 07/15/2016 Document Reviewed: 02/17/2014 Elsevier Interactive Patient Education  2017 Elsevier Inc.  

## 2017-04-05 NOTE — Progress Notes (Signed)
Patient is here for right shoulder pain back pain & chest pain due to coughing   Patient has taking his medication for the AM  Patient has not eaten for today

## 2017-04-05 NOTE — Progress Notes (Signed)
Subjective:  Patient ID: Adrian Schultz, male    DOB: 07-29-1966  Age: 51 y.o. MRN: [id number]  CC: Diabetes   HPI  Adrian Schultz presents for    Upper Respiratory Infection: Patient complains of symptoms of a URI. Symptoms include cough. Onset of symptoms was 5 days ago, unchanged since that time. He also c/o cough described as productive of yellow and green sputum, nasal congestion and sinus pressure for the past 5 days .  He is drinking moderate amounts of fluids. Evaluation to date: none. Treatment to date: He reports taking ibuprofen for symptoms. Pneumonia 8 months ago. He stays in a shelter. History of He is a non-smoker, but reports use of smokeless tobacco since age 42.    Joint/Muscle Pain: Patient complains of arthralgias for which has been present for 3 weeks. Pain is located in the right shoulder(s), is described as aching, and is moderate .  Associated symptoms include: none.  The patient has ibuprofen.  Related to injury:   no.    Diabetes Mellitus: Patient presents for follow up of diabetes. Symptoms: none. Symptoms have been basically asymptomatic. Patient denies none.  Evaluation to date has been included: hemoglobin A1C.  Home sugars: patient does not check sugars. Treatment to date: no recent interventions. He is not adherent with taking diabetic medications.  Hypertension: Patient here for follow-up of elevated blood pressure. He is not exercising and is not adherent to low salt diet.  Blood pressure He does not check his blood pressures. well controlled at home. Cardiac symptoms none. Patient denies none.  Cardiovascular risk factors: diabetes mellitus, hypertension, male gender, sedentary lifestyle and smoking/ tobacco exposure. Use of agents associated with hypertension: NSAIDS. History of target organ damage: none.    Outpatient Medications Prior to Visit  Medication Sig Dispense Refill  . Blood Glucose Monitoring Suppl (TRUE METRIX METER) w/Device KIT 1 each by Does  not apply route daily. 1 kit 0  . ibuprofen (ADVIL,MOTRIN) 800 MG tablet Take 1 tablet (800 mg total) by mouth 3 (three) times daily. 12 tablet 0  . glucose blood (TRUE METRIX BLOOD GLUCOSE TEST) test strip Use as instructed 100 each 12  . lisinopril (PRINIVIL) 10 MG tablet Take 1 tablet (10 mg total) by mouth daily. 30 tablet 0  . metFORMIN (GLUCOPHAGE) 1000 MG tablet Take 1 tablet (1,000 mg total) by mouth 2 (two) times daily. 60 tablet 0  . TRUEPLUS LANCETS 28G MISC 1 each by Does not apply route daily. 100 each 11   No facility-administered medications prior to visit.     ROS Review of Systems  Constitutional: Negative.   HENT: Positive for rhinorrhea and sinus pressure.   Eyes: Negative.  Negative for itching.  Respiratory: Positive for cough.   Cardiovascular: Negative.   Gastrointestinal: Negative.   Musculoskeletal: Positive for arthralgias.  Skin: Negative.   Neurological: Negative.   Psychiatric/Behavioral: Negative for suicidal ideas.    Objective:  BP 134/76 (BP Location: Left Arm, Patient Position: Sitting, Cuff Size: Normal)   Pulse 93   Temp 98.2 F (36.8 C) (Oral)   Resp 18   Ht _0  (1.676 m)   Wt 194 lb 6.4 oz (88.2 kg)   SpO2 95%   BMI 31.38 kg/m   BP/Weight 04/05/2017 03/16/2017 06/28/2121  Systolic BP 482 500 370  Diastolic BP 76 87 99  Wt. (Lbs) 194.4 - 231.4  BMI 31.38 - -   Physical Exam  Constitutional: He appears well-developed and well-nourished.  HENT:  Head: Normocephalic and atraumatic.  Right Ear: External ear normal.  Left Ear: External ear normal.  Nose: Rhinorrhea present.  Mouth/Throat: Oropharynx is clear and moist. No oropharyngeal exudate.  Eyes: Conjunctivae are normal. Pupils are equal, round, and reactive to light.  Neck: No JVD present.  Cardiovascular: Normal rate, regular rhythm, normal heart sounds and intact distal pulses.   Pulmonary/Chest: Effort normal and breath sounds normal.  Abdominal: Soft. Bowel sounds are  normal.  Musculoskeletal:       Right shoulder: He exhibits pain.  Skin: Skin is warm and dry.  Psychiatric: He has a normal mood and affect.  Nursing note and vitals reviewed.    Assessment & Plan:   Problem List Items Addressed This Visit      Endocrine   Type 2 diabetes mellitus without complication, without long-term current use of insulin (HCC)   Relevant Medications   metFORMIN (GLUCOPHAGE) 1000 MG tablet   lisinopril (PRINIVIL) 10 MG tablet   glucose blood (TRUE METRIX BLOOD GLUCOSE TEST) test strip   TRUEPLUS LANCETS 28G MISC   Other Relevant Orders   POCT glycosylated hemoglobin (Hb A1C) (Completed)   Glucose (CBG) (Completed)   POCT UA - Microalbumin    Other Visit Diagnoses    Upper respiratory tract infection, unspecified type    -  Primary   Relevant Medications   azithromycin (ZITHROMAX) 250 MG tablet   guaiFENesin-dextromethorphan (ROBITUSSIN DM) 100-10 MG/5ML syrup   Other Relevant Orders   Respiratory virus panel (Completed)   CBC with Differential   Basic Metabolic Panel   Acute bronchitis, unspecified organism       Relevant Medications   azithromycin (ZITHROMAX) 250 MG tablet   guaiFENesin-dextromethorphan (ROBITUSSIN DM) 100-10 MG/5ML syrup   Hypertension, unspecified type       Relevant Medications   lisinopril (PRINIVIL) 10 MG tablet   Other Relevant Orders   Lipid Panel   Chronic right shoulder pain       Relevant Medications   meloxicam (MOBIC) 7.5 MG tablet   Hyperglycemia       Relevant Medications   glucose blood (TRUE METRIX BLOOD GLUCOSE TEST) test strip   TRUEPLUS LANCETS 28G MISC      Meds ordered this encounter  Medications  . DISCONTD: metFORMIN (GLUCOPHAGE) 1000 MG tablet    Sig: Take 1 tablet (1,000 mg total) by mouth 2 (two) times daily.    Dispense:  60 tablet    Refill:  0    Order Specific Question:   Supervising Provider    Answer:   Tresa Garter [id number]  . DISCONTD: lisinopril (PRINIVIL) 10 MG tablet     Sig: Take 1 tablet (10 mg total) by mouth daily.    Dispense:  30 tablet    Refill:  0    Order Specific Question:   Supervising Provider    Answer:   Tresa Garter [id number]  . azithromycin (ZITHROMAX) 250 MG tablet    Sig: Day one take 2 tablets (500 mg total) by mouth. Take 1 tablet once daily.    Dispense:  6 tablet    Refill:  0    Order Specific Question:   Supervising Provider    Answer:   Tresa Garter [id number]  . guaiFENesin-dextromethorphan (ROBITUSSIN DM) 100-10 MG/5ML syrup    Sig: Take 5 mLs by mouth every 4 (four) hours as needed for cough.    Dispense:  118 mL    Refill:  0  Order Specific Question:   Supervising Provider    Answer:   Tresa Garter [id number]  . meloxicam (MOBIC) 7.5 MG tablet    Sig: Take 1 tablet (7.5 mg total) by mouth daily.    Dispense:  30 tablet    Refill:  1    Order Specific Question:   Supervising Provider    Answer:   Tresa Garter [id number]  . metFORMIN (GLUCOPHAGE) 1000 MG tablet    Sig: Take 1 tablet (1,000 mg total) by mouth 2 (two) times daily.    Dispense:  60 tablet    Refill:  3    Order Specific Question:   Supervising Provider    Answer:   Tresa Garter [id number]  . DISCONTD: lisinopril (PRINIVIL) 10 MG tablet    Sig: Take 1 tablet (10 mg total) by mouth daily.    Dispense:  30 tablet    Refill:  3    Order Specific Question:   Supervising Provider    Answer:   Tresa Garter [id number]  . lisinopril (PRINIVIL) 10 MG tablet    Sig: Take 1 tablet (10 mg total) by mouth daily.    Dispense:  30 tablet    Refill:  3    Order Specific Question:   Supervising Provider    Answer:   Tresa Garter [id number]  . glucose blood (TRUE METRIX BLOOD GLUCOSE TEST) test strip    Sig: Use as instructed    Dispense:  100 each    Refill:  12    Order Specific Question:   Supervising Provider    Answer:   Tresa Garter [[id number]]  . TRUEPLUS LANCETS 28G MISC    Sig: 1 each by Does not  apply route daily.    Dispense:  100 each    Refill:  11    Order Specific Question:   Supervising Provider    Answer:   Tresa Garter [[id number]]    Follow-up: Return in about 1 month (around 05/06/2017), or if symptoms worsen or fail to improve, for Diabetes.   Alfonse Spruce FNP

## 2017-04-09 LAB — RESPIRATORY VIRUS PANEL
Adenovirus: NEGATIVE
Influenza A: NEGATIVE
Influenza B: NEGATIVE
Metapneumovirus: NEGATIVE
Parainfluenza 1: NEGATIVE
Parainfluenza 2: NEGATIVE
Parainfluenza 3: NEGATIVE
Respiratory Syncytial Virus A: NEGATIVE
Respiratory Syncytial Virus B: NEGATIVE
Rhinovirus: NEGATIVE

## 2017-04-12 ENCOUNTER — Ambulatory Visit: Payer: Self-pay | Attending: Family Medicine

## 2017-04-12 ENCOUNTER — Other Ambulatory Visit: Payer: Self-pay | Admitting: Family Medicine

## 2017-04-12 DIAGNOSIS — J069 Acute upper respiratory infection, unspecified: Secondary | ICD-10-CM

## 2017-04-12 DIAGNOSIS — J209 Acute bronchitis, unspecified: Secondary | ICD-10-CM

## 2017-04-12 DIAGNOSIS — I10 Essential (primary) hypertension: Secondary | ICD-10-CM | POA: Insufficient documentation

## 2017-04-12 MED FILL — ROBAFEN-DM SYRUP: 100-10 | 3 days supply | Qty: 118 | Fill #0

## 2017-04-12 NOTE — Progress Notes (Signed)
Patient here for lab visit only 

## 2017-04-13 LAB — LIPID PANEL
Chol/HDL Ratio: 5.3 ratio — ABNORMAL HIGH (ref 0.0–5.0)
Cholesterol, Total: 139 mg/dL (ref 100–199)
HDL: 26 mg/dL — ABNORMAL LOW (ref >39)
LDL Calculated: 80 mg/dL (ref 0–99)
Triglycerides: 163 mg/dL — ABNORMAL HIGH (ref 0–149)
VLDL Cholesterol Cal: 33 mg/dL (ref 5–40)

## 2017-04-15 ENCOUNTER — Other Ambulatory Visit: Payer: Self-pay | Admitting: Family Medicine

## 2017-04-15 ENCOUNTER — Telehealth: Payer: Self-pay

## 2017-04-15 DIAGNOSIS — E785 Hyperlipidemia, unspecified: Principal | ICD-10-CM

## 2017-04-15 DIAGNOSIS — E1169 Type 2 diabetes mellitus with other specified complication: Secondary | ICD-10-CM

## 2017-04-15 MED ORDER — PRAVASTATIN SODIUM 20 MG PO TABS: 20.0000 mg | ORAL_TABLET | Freq: Every day | ORAL | 0 refills | Status: DC

## 2017-04-15 NOTE — Telephone Encounter (Signed)
-----   Message from Lizbeth BarkMandesia R Hairston, FNP sent at 04/15/2017  9:59 AM EDT ----- -Respiratory virus panel was negative for most common respiratory viruses. -Lipid levels were elevated. This can increase your risk of heart disease. You were prescribed pravastatin to help lower this risk. Recommend follow up in 3 months. -Continue to take your diabetic medications. Avoid eating large amounts of starches, white bread, rice, and sugar.

## 2017-04-15 NOTE — Telephone Encounter (Signed)
CMA call regarding lab results   Patient Verify DOB   Patient was aware and understood  

## 2017-04-19 ENCOUNTER — Telehealth: Payer: Self-pay | Admitting: *Deleted

## 2017-04-19 NOTE — Telephone Encounter (Signed)
Patient verified DOB Patient is aware of pravastatin being ordered for lipid elevated levels. Patient expressed his understanding and had no further questions at this time.

## 2017-04-19 NOTE — Telephone Encounter (Signed)
-----   Message from Lizbeth BarkMandesia R Hairston, FNP sent at 04/19/2017 10:32 AM EDT ----- Lipid levels were elevated. This can increase your risk of heart disease. Take pravastatin to help lower risk. Start eating a diet low in saturated fat. Limit your intake of fried foods, red meats, and whole milk. Increase physical activity.

## 2017-04-24 ENCOUNTER — Telehealth: Payer: Self-pay

## 2017-04-24 NOTE — Telephone Encounter (Signed)
CMA call regarding lab results   Patient verify DOB  Patient was aware and understood  

## 2017-04-24 NOTE — Telephone Encounter (Signed)
-----   Message from Mandesia R Hairston, FNP sent at 04/19/2017 10:32 AM EDT ----- Lipid levels were elevated. This can increase your risk of heart disease. Take pravastatin to help lower risk. Start eating a diet low in saturated fat. Limit your intake of fried foods, red meats, and whole milk. Increase physical activity.    

## 2017-04-25 NOTE — Congregational Nurse Program (Signed)
Congregational Nurse Program Note  Date of Encounter: 04/25/2017  Past Medical History: Past Medical History:  Diagnosis Date  . Diabetes mellitus without complication (HCC)   . ETOH abuse   . Suicidal ideation     Encounter Details:     CNP Questionnaire - 04/25/17 1508      Patient Demographics   Is this a new or existing patient? Existing   Patient is considered a/an Not Applicable   Race African-American/Black     Patient Assistance   Location of Patient Assistance [place]   Patient's financial/insurance status Self-Pay (Uninsured);Low Income;[employer] Card/Care Connects   Uninsured Patient ([employer] Research officer, trade unionCard/Care Connects) Yes   Interventions Not Applicable   Patient referred to apply for the following financial assistance Not Applicable   Food insecurities addressed Not Applicable   Transportation assistance No   Assistance securing medications No   Product/process development scientistducational health offerings Navigating the healthcare system;Acute disease     Encounter Details   Primary purpose of visit Navigating the Healthcare System;Acute Illness/Condition Visit   Was an Emergency Department visit averted? Not Applicable   Does patient have a medical provider? Yes   Patient referred to Follow up with established PCP   Was a mental health screening completed? (GAINS tool) No   Does patient have dental issues? Yes   Was a dental referral made? Yes   Does patient have vision issues? No   Does your patient have an abnormal blood pressure today? No   Since previous encounter, have you referred patient for abnormal blood pressure that resulted in a new diagnosis or medication change? No   Does your patient have an abnormal blood glucose today? No   Since previous encounter, have you referred patient for abnormal blood glucose that resulted in a new diagnosis or medication change? No   Was there a life-saving intervention made? No         Clinical Intake - 04/05/17 1126      Pre-visit preparation   Pre-visit preparation completed Yes     Pain   Pain  No/denies pain   Pain Score 0-No pain     Nutrition Screen   Nutritional Risks None   Diabetes Yes   CBG done? Yes   CBG resulted in Enter/ Edit results? Yes   Did pt. bring in CBG monitor from home? No     Functional Status   Activities of Daily Living Independent   Ambulation Independent   Medication Administration Independent   Home Management Independent     Risk/Barriers   Barriers to Care Management & Learning None   Psychosocial Barriers Financial need     Abuse/Neglect   Do you feel unsafe in your current relationship? No   Do you feel physically threatened by others? No   Anyone hurting you at home, work, or school? No   Unable to ask? No     Web designerLanguage Assistant   Interpreter Needed? No     Requesting appointment for upcoming dental clinic.  Will need to bring orange card to validate on Monday before referral can be made.  Requested information about tobacco cessation.   Provided information and phone contact for "Quit".  Instructed to return to clinic on Monday for completion of dental referral.

## 2017-04-29 MED FILL — PRAVASTATIN NA 20 MG TAB: 20 | 30 days supply | Qty: 30 | Fill #0

## 2017-05-06 ENCOUNTER — Other Ambulatory Visit: Payer: Self-pay | Admitting: Family Medicine

## 2017-05-06 DIAGNOSIS — M25511 Pain in right shoulder: Principal | ICD-10-CM

## 2017-05-06 DIAGNOSIS — G8929 Other chronic pain: Secondary | ICD-10-CM

## 2017-05-06 MED FILL — MELOXICAM 7.5 MG TABLET: 7.5 | 30 days supply | Qty: 30 | Fill #1

## 2017-05-09 MED FILL — TRUEplus LANCETS 28G MISC: 30 days supply | Qty: 100 | Fill #1

## 2017-05-10 ENCOUNTER — Ambulatory Visit: Payer: Self-pay | Admitting: Family Medicine

## 2017-05-10 ENCOUNTER — Other Ambulatory Visit: Payer: Self-pay | Admitting: Pharmacist

## 2017-05-10 DIAGNOSIS — R739 Hyperglycemia, unspecified: Secondary | ICD-10-CM

## 2017-05-10 DIAGNOSIS — E119 Type 2 diabetes mellitus without complications: Secondary | ICD-10-CM

## 2017-05-10 MED ORDER — GLUCOSE BLOOD VI STRP: ORAL_STRIP | 12 refills | Status: DC

## 2017-05-10 MED ORDER — TRUEPLUS LANCETS 28G MISC: 1.0000 | Freq: Every day | 11 refills | Status: DC

## 2017-05-10 MED FILL — TRUE METRIX TEST STRIP: 30 days supply | Qty: 100 | Fill #1

## 2017-05-13 MED FILL — TRUE METRIX TEST STRIP: 25 days supply | Qty: 100 | Fill #0

## 2017-05-13 MED FILL — TRUEplus LANCETS 28G MISC: 30 days supply | Qty: 100 | Fill #0

## 2017-05-15 ENCOUNTER — Ambulatory Visit: Payer: Self-pay | Admitting: Family Medicine

## 2017-05-16 ENCOUNTER — Ambulatory Visit: Payer: Self-pay | Attending: Family Medicine | Admitting: Family Medicine

## 2017-05-16 VITALS — BP 132/84 | HR 101 | Temp 98.3°F | Resp 18 | Ht 67.0 in | Wt 182.2 lb

## 2017-05-16 DIAGNOSIS — M75101 Unspecified rotator cuff tear or rupture of right shoulder, not specified as traumatic: Secondary | ICD-10-CM | POA: Insufficient documentation

## 2017-05-16 DIAGNOSIS — Z8709 Personal history of other diseases of the respiratory system: Secondary | ICD-10-CM

## 2017-05-16 DIAGNOSIS — R0981 Nasal congestion: Secondary | ICD-10-CM

## 2017-05-16 DIAGNOSIS — Z79899 Other long term (current) drug therapy: Secondary | ICD-10-CM | POA: Insufficient documentation

## 2017-05-16 DIAGNOSIS — J40 Bronchitis, not specified as acute or chronic: Secondary | ICD-10-CM | POA: Insufficient documentation

## 2017-05-16 DIAGNOSIS — E119 Type 2 diabetes mellitus without complications: Secondary | ICD-10-CM | POA: Insufficient documentation

## 2017-05-16 DIAGNOSIS — J849 Interstitial pulmonary disease, unspecified: Secondary | ICD-10-CM | POA: Insufficient documentation

## 2017-05-16 DIAGNOSIS — Z7984 Long term (current) use of oral hypoglycemic drugs: Secondary | ICD-10-CM | POA: Insufficient documentation

## 2017-05-16 LAB — GLUCOSE, POCT (MANUAL RESULT ENTRY): POC Glucose: 325 mg/dl — AB (ref 70–99)

## 2017-05-16 MED ORDER — TRAMADOL HCL 50 MG PO TABS: 50.0000 mg | ORAL_TABLET | Freq: Three times a day (TID) | ORAL | 0 refills | Status: DC | PRN

## 2017-05-16 MED ORDER — GUAIFENESIN-DM 100-10 MG/5ML PO SYRP: 5.0000 mL | ORAL_SOLUTION | ORAL | 0 refills | Status: DC | PRN

## 2017-05-16 MED ORDER — ALBUTEROL SULFATE HFA 108 (90 BASE) MCG/ACT IN AERS: 2.0000 | INHALATION_SPRAY | Freq: Four times a day (QID) | RESPIRATORY_TRACT | 1 refills | Status: DC | PRN

## 2017-05-16 MED ORDER — METFORMIN HCL ER 500 MG PO TB24: 1000.0000 mg | ORAL_TABLET | Freq: Two times a day (BID) | ORAL | 3 refills | Status: DC

## 2017-05-16 MED ORDER — ACETAMINOPHEN 500 MG PO TABS: 1000.0000 mg | ORAL_TABLET | Freq: Three times a day (TID) | ORAL | 1 refills | Status: DC | PRN

## 2017-05-16 MED ORDER — GLIPIZIDE 5 MG PO TABS: ORAL_TABLET | ORAL | 2 refills | Status: DC

## 2017-05-16 MED ORDER — GUAIFENESIN ER 600 MG PO TB12: 600.0000 mg | ORAL_TABLET | Freq: Two times a day (BID) | ORAL | 0 refills | Status: DC

## 2017-05-16 MED ORDER — IPRATROPIUM-ALBUTEROL 0.5-2.5 (3) MG/3ML IN SOLN
3.0000 mL | RESPIRATORY_TRACT | Status: DC | PRN
  Administered 2017-05-16: 3 mL via RESPIRATORY_TRACT

## 2017-05-16 MED FILL — METFORMIN HCL ER 500 MG TAB: 500 | 30 days supply | Qty: 120 | Fill #0

## 2017-05-16 MED FILL — !VENTOLIN HFA INHALER: 108 (90 BAS | 25 days supply | Qty: 18 | Fill #0

## 2017-05-16 MED FILL — glipiZIDE 5 MG TABS: 5 | 30 days supply | Qty: 15 | Fill #0

## 2017-05-16 MED FILL — ROBAFEN-DM SYRUP: 100-10 | 3 days supply | Qty: 118 | Fill #0

## 2017-05-16 NOTE — Progress Notes (Signed)
Patient is here for f/up DM  Patient decline insulin for today visit

## 2017-05-16 NOTE — Patient Instructions (Addendum)
Start checking blood sugars twice a day. Bring glucometer or log of blood sugars to next office visit follow up.   Acute Bronchitis, Adult Acute bronchitis is when air tubes (bronchi) in the lungs suddenly get swollen. The condition can make it hard to breathe. It can also cause these symptoms:  A cough.  Coughing up clear, yellow, or green mucus.  Wheezing.  Chest congestion.  Shortness of breath.  A fever.  Body aches.  Chills.  A sore throat.  Follow these instructions at home: Medicines  Take over-the-counter and prescription medicines only as told by your doctor.  If you were prescribed an antibiotic medicine, take it as told by your doctor. Do not stop taking the antibiotic even if you start to feel better. General instructions  Rest.  Drink enough fluids to keep your pee (urine) clear or pale yellow.  Avoid smoking and secondhand smoke. If you smoke and you need help quitting, ask your doctor. Quitting will help your lungs heal faster.  Use an inhaler, cool mist vaporizer, or humidifier as told by your doctor.  Keep all follow-up visits as told by your doctor. This is important. How is this prevented? To lower your risk of getting this condition again:  Wash your hands often with soap and water. If you cannot use soap and water, use hand sanitizer.  Avoid contact with people who have cold symptoms.  Try not to touch your hands to your mouth, nose, or eyes.  Make sure to get the flu shot every year.  Contact a doctor if:  Your symptoms do not get better in 2 weeks. Get help right away if:  You cough up blood.  You have chest pain.  You have very bad shortness of breath.  You become dehydrated.  You faint (pass out) or keep feeling like you are going to pass out.  You keep throwing up (vomiting).  You have a very bad headache.  Your fever or chills gets worse. This information is not intended to replace advice given to you by your health  care provider. Make sure you discuss any questions you have with your health care provider. Document Released: 04/30/2008 Document Revised: 06/20/2016 Document Reviewed: 05/02/2016 Elsevier Interactive Patient Education  2017 [employer].   Type 2 Diabetes Mellitus, Self Care, Adult When you have type 2 diabetes (type 2 diabetes mellitus), you must keep your blood sugar (glucose) under control. You can do this with:  Nutrition.  Exercise.  Lifestyle changes.  Medicines or insulin, if needed.  Support from your doctors and others.  How do I manage my blood sugar?  Check your blood sugar level every day, as often as told.  Call your doctor if your blood sugar is above your goal numbers for 2 tests in a row.  Have your A1c (hemoglobin A1c) level checked at least two times a year. Have it checked more often if your doctor tells you to. Your doctor will set treatment goals for you. Generally, you should have these blood sugar levels:  Before meals (preprandial): 80-130 mg/dL (4.4-7.2 mmol/L).  After meals (postprandial): lower than 180 mg/dL (10 mmol/L).  A1c level: less than 7%.  What do I need to know about high blood sugar? High blood sugar is called hyperglycemia. Know the signs of high blood sugar. Signs may include:  Feeling: ? Thirsty. ? Hungry. ? Very tired.  Needing to pee (urinate) more than usual.  Blurry vision.  What do I need to know about low  blood sugar? Low blood sugar is called hypoglycemia. This is when blood sugar is at or below 70 mg/dL (3.9 mmol/L). Symptoms may include:  Feeling: ? Hungry. ? Worried or nervous (anxious). ? Sweaty and clammy. ? Confused. ? Dizzy. ? Sleepy. ? Sick to your stomach (nauseous).  Having: ? A fast heartbeat (palpitations). ? A headache. ? A change in your vision. ? Jerky movements that you cannot control (seizure). ? Nightmares. ? Tingling or no feeling (numbness) around the mouth, lips, or tongue.  Having  trouble with: ? Talking. ? Paying attention (concentrating). ? Moving (coordination). ? Sleeping.  Shaking.  Passing out (fainting).  Getting upset easily (irritability).  Treating low blood sugar  To treat low blood sugar, eat or drink something sugary right away. If you can think clearly and swallow safely, follow the 15:15 rule:  Take 15 grams of a fast-acting carb (carbohydrate). Some fast-acting carbs are: ? 1 tube of glucose gel. ? 3 sugar tablets (glucose pills). ? 6-8 pieces of hard candy. ? 4 oz (120 mL) of fruit juice. ? 4 oz (120 mL) regular (not diet) soda.  Check your blood sugar 15 minutes after you take the carb.  If your blood sugar is still at or below 70 mg/dL (3.9 mmol/L), take 15 grams of a carb again.  If your blood sugar does not go above 70 mg/dL (3.9 mmol/L) after 3 tries, get help right away.  After your blood sugar goes back to normal, eat a meal or a snack within 1 hour.  Treating very low blood sugar If your blood sugar is at or below 54 mg/dL (3 mmol/L), you have very low blood sugar (severe hypoglycemia). This is an emergency. Do not wait to see if the symptoms will go away. Get medical help right away. Call your local emergency services (911 in the [place] Do not drive yourself to the hospital. If you have very low blood sugar and you cannot eat or drink, you may need a glucagon shot (injection). A family member or friend should learn how to check your blood sugar and how to give you a glucagon shot. Ask your doctor if you need to have a glucagon shot kit at home. What else is important to manage my diabetes? Medicine Follow these instructions about insulin and diabetes medicines:  Take them as told by your doctor.  Adjust them as told by your doctor.  Do not run out of them.  Having diabetes can raise your risk for other long-term conditions. These include heart or kidney disease. Your doctor may prescribe medicines to help prevent problems  from diabetes. Food   Make healthy food choices. These include: ? Chicken, fish, egg whites, and beans. ? Oats, whole wheat, bulgur, brown rice, quinoa, and millet. ? Fresh fruits and vegetables. ? Low-fat dairy products. ? Nuts, avocado, olive oil, and canola oil.  Make a food plan with a specialist (dietitian).  Follow instructions from your doctor about what you cannot eat or drink.  Drink enough fluid to keep your pee (urine) clear or pale yellow.  Eat healthy snacks between healthy meals.  Keep track of carbs that you eat. Read food labels. Learn food serving sizes.  Follow your sick day plan when you cannot eat or drink normally. Make this plan with your doctor so it is ready to use. Activity  Exercise at least 3 times a week.  Do not go more than 2 days without exercising.  Talk with your doctor before you  start a new exercise. Your doctor may need to adjust your insulin, medicines, or food. Lifestyle   Do not use any tobacco products. These include cigarettes, chewing tobacco, and e-cigarettes.If you need help quitting, ask your doctor.  Ask your doctor how much alcohol is safe for you.  Learn to deal with stress. If you need help with this, ask your doctor. Body care  Stay up to date with your shots (immunizations).  Have your eyes and feet checked by a doctor as often as told.  Check your skin and feet every day. Check for cuts, bruises, redness, blisters, or sores.  Brush your teeth and gums two times a day.  Floss at least one time a day.  Go to the dentist least one time every 6 months.  Stay at a healthy weight. General instructions   Take over-the-counter and prescription medicines only as told by your doctor.  Share your diabetes care plan with: ? Your work or school. ? People you live with.  Check your pee (urine) for ketones: ? When you are sick. ? As told by your doctor.  Carry a card or wear jewelry that says that you have  diabetes.  Ask your doctor: ? Do I need to meet with a diabetes educator? ? Where can I find a support group for people with diabetes?  Keep all follow-up visits as told by your doctor. This is important. Where to find more information: To learn more about diabetes, visit:  American Diabetes Association: www.diabetes.org  American Association of Diabetes Educators: www.diabeteseducator.org/patient-resources  This information is not intended to replace advice given to you by your health care provider. Make sure you discuss any questions you have with your health care provider. Document Released: 03/05/2016 Document Revised: 04/19/2016 Document Reviewed: 12/16/2015 Elsevier Interactive Patient Education  [employer].

## 2017-05-16 NOTE — Progress Notes (Signed)
Subjective:  Patient ID: Adrian Schultz, male    DOB: Sep 27, 1966  Age: 51 y.o. MRN: [id number]  CC: Diabetes   HPI Adrian Schultz presents for cough and congestion. Associated symptoms include wheezing. He denies any constitutional symptoms.  Patient presents for follow up of diabetes. Symptoms: none. Symptoms have been basically asymptomatic. Patient denies none.  Evaluation to date has been included: hemoglobin A1C.  Home sugars: patient does not check sugars. Treatment to date: no recent interventions. He is not adherent with taking diabetic medications. He also complains of arthralgias for which has been present for 2 month. Pain is located in the right shoulder(s), is described as aching, and is moderate. Associated symptoms include: decreased ROM and right hand strength. Reports taking ibuprofen for relief of symptoms.  Related to injury: no.  Outpatient Medications Prior to Visit  Medication Sig Dispense Refill  . azithromycin (ZITHROMAX) 250 MG tablet Day one take 2 tablets (500 mg total) by mouth. Take 1 tablet once daily. 6 tablet 0  . Blood Glucose Monitoring Suppl (TRUE METRIX METER) w/Device KIT 1 each by Does not apply route daily. 1 kit 0  . glucose blood (TRUE METRIX BLOOD GLUCOSE TEST) test strip Use as instructed 100 each 12  . ibuprofen (ADVIL,MOTRIN) 800 MG tablet Take 1 tablet (800 mg total) by mouth 3 (three) times daily. 12 tablet 0  . lisinopril (PRINIVIL) 10 MG tablet Take 1 tablet (10 mg total) by mouth daily. 30 tablet 3  . meloxicam (MOBIC) 7.5 MG tablet Take 1 tablet (7.5 mg total) by mouth daily. 30 tablet 1  . pravastatin (PRAVACHOL) 20 MG tablet Take 1 tablet (20 mg total) by mouth daily. 90 tablet 0  . TRUEPLUS LANCETS 28G MISC 1 each by Does not apply route daily. 100 each 11  . metFORMIN (GLUCOPHAGE) 1000 MG tablet Take 1 tablet (1,000 mg total) by mouth 2 (two) times daily. 60 tablet 3  . ROBAFEN DM 100-10 MG/5ML liquid TAKE 5 MLS BY MOUTH EVERY 4 (FOUR) HOURS  AS NEEDED FOR COUGH. 118 mL 0   No facility-administered medications prior to visit.     ROS Review of Systems  Constitutional: Negative.   HENT: Negative.   Eyes: Negative.   Respiratory: Positive for cough and wheezing.   Cardiovascular: Negative.   Gastrointestinal: Negative.   Musculoskeletal: Positive for arthralgias and myalgias.  Skin: Negative.     Objective:  BP 132/84 (BP Location: Left Arm, Patient Position: Sitting, Cuff Size: Normal)   Pulse (!) 101   Temp 98.3 F (36.8 C) (Oral)   Resp 18   Ht 5' 7"  (1.702 m)   Wt 182 lb 3.2 oz (82.6 kg)   SpO2 97%   BMI 28.54 kg/m   BP/Weight 05/16/2017 04/05/2017 2/87/6811  Systolic BP 572 620 355  Diastolic BP 84 76 87  Wt. (Lbs) 182.2 194.4 -  BMI 28.54 31.38 -   Physical Exam  Constitutional: He appears well-developed and well-nourished.  Eyes: Conjunctivae are normal. Pupils are equal, round, and reactive to light.  Cardiovascular: Normal rate, regular rhythm, normal heart sounds and intact distal pulses.   Pulmonary/Chest: Effort normal. He has wheezes.  Abdominal: Soft. Bowel sounds are normal.  Musculoskeletal:       Right shoulder: He exhibits decreased range of motion, pain and decreased strength (4/5 muscle strength ).  Skin: Skin is warm and dry.  Nursing note and vitals reviewed.  Assessment & Plan:   Problem List Items Addressed This  Visit      Endocrine   Type 2 diabetes mellitus without complication, without long-term current use of insulin ([place])   Metformin formulation changed for better adherence.    Glipizide 2.5 mg QD added.    Check CBG BID and bring log or glucometer to next visit.   Follow up in 2 weeks with clinical pharmacist.   Follow up in 8 weeks with PCP.   Relevant Medications   metFORMIN (GLUCOPHAGE XR) 500 MG 24 hr tablet   glipiZIDE (GLUCOTROL) 5 MG tablet   Other Relevant Orders   Glucose (CBG) (Completed)    Other Visit Diagnoses    Bronchitis    -  Primary   Relevant  Medications   ipratropium-albuterol (DUONEB) 0.5-2.5 (3) MG/3ML nebulizer solution 3 mL   albuterol (PROVENTIL HFA;VENTOLIN HFA) 108 (90 Base) MCG/ACT inhaler   guaiFENesin (MUCINEX) 600 MG 12 hr tablet   guaiFENesin-dextromethorphan (ROBITUSSIN DM) 100-10 MG/5ML syrup   Other Relevant Orders   DG Chest 2 View   Respiratory virus panel (Completed)   Rotator cuff syndrome, right       Relevant Medications   traMADol (ULTRAM) 50 MG tablet   acetaminophen (TYLENOL) 500 MG tablet   Other Relevant Orders   DG Shoulder Right   Nasal congestion       History of interstitial lung disease       Per last CXR   Relevant Orders   Ambulatory referral to Pulmonology      Meds ordered this encounter  Medications  . DISCONTD: metFORMIN (GLUCOPHAGE XR) 500 MG 24 hr tablet    Sig: Take 2 tablets (1,000 mg total) by mouth 2 (two) times daily with a meal.    Dispense:  120 tablet    Refill:  3    Order Specific Question:   Supervising Provider    Answer:   Tresa Garter [id number]  . DISCONTD: glipiZIDE (GLUCOTROL) 5 MG tablet    Sig: Take half a tablet by mouth daily before breakfast.    Dispense:  30 tablet    Refill:  2    Order Specific Question:   Supervising Provider    Answer:   Tresa Garter [id number]  . ipratropium-albuterol (DUONEB) 0.5-2.5 (3) MG/3ML nebulizer solution 3 mL  . DISCONTD: guaiFENesin (MUCINEX) 600 MG 12 hr tablet    Sig: Take 1 tablet (600 mg total) by mouth 2 (two) times daily.    Dispense:  14 tablet    Refill:  0    Order Specific Question:   Supervising Provider    Answer:   Tresa Garter [id number]  . DISCONTD: guaiFENesin-dextromethorphan (ROBITUSSIN DM) 100-10 MG/5ML syrup    Sig: Take 5 mLs by mouth every 4 (four) hours as needed for cough.    Dispense:  118 mL    Refill:  0    Order Specific Question:   Supervising Provider    Answer:   Tresa Garter [id number]  . DISCONTD: albuterol (PROVENTIL HFA;VENTOLIN HFA) 108 (90 Base)  MCG/ACT inhaler    Sig: Inhale 2 puffs into the lungs every 6 (six) hours as needed for wheezing or shortness of breath.    Dispense:  1 Inhaler    Refill:  1    Order Specific Question:   Supervising Provider    Answer:   Tresa Garter [id number]  . traMADol (ULTRAM) 50 MG tablet    Sig: Take 1 tablet (50 mg total) by mouth every 8 (eight) hours as  needed for severe pain.    Dispense:  30 tablet    Refill:  0    Order Specific Question:   Supervising Provider    Answer:   Tresa Garter [id number]  . metFORMIN (GLUCOPHAGE XR) 500 MG 24 hr tablet    Sig: Take 2 tablets (1,000 mg total) by mouth 2 (two) times daily with a meal.    Dispense:  120 tablet    Refill:  3    Order Specific Question:   Supervising Provider    Answer:   Tresa Garter [id number]  . glipiZIDE (GLUCOTROL) 5 MG tablet    Sig: Take half a tablet by mouth daily before breakfast.    Dispense:  30 tablet    Refill:  2    Order Specific Question:   Supervising Provider    Answer:   Tresa Garter [id number]  . albuterol (PROVENTIL HFA;VENTOLIN HFA) 108 (90 Base) MCG/ACT inhaler    Sig: Inhale 2 puffs into the lungs every 6 (six) hours as needed for wheezing or shortness of breath.    Dispense:  1 Inhaler    Refill:  1    Order Specific Question:   Supervising Provider    Answer:   Tresa Garter [id number]  . guaiFENesin (MUCINEX) 600 MG 12 hr tablet    Sig: Take 1 tablet (600 mg total) by mouth 2 (two) times daily.    Dispense:  14 tablet    Refill:  0    Order Specific Question:   Supervising Provider    Answer:   Tresa Garter [id number]  . guaiFENesin-dextromethorphan (ROBITUSSIN DM) 100-10 MG/5ML syrup    Sig: Take 5 mLs by mouth every 4 (four) hours as needed for cough.    Dispense:  118 mL    Refill:  0    Order Specific Question:   Supervising Provider    Answer:   Tresa Garter [id number]  . acetaminophen (TYLENOL) 500 MG tablet    Sig: Take 2 tablets (1,000 mg  total) by mouth every 8 (eight) hours as needed for moderate pain.    Dispense:  30 tablet    Refill:  1    Order Specific Question:   Supervising Provider    Answer:   Tresa Garter [[id number]]    Follow-up: Return in about 2 weeks (around 05/30/2017) for DM with clinical pharmacist.   Alfonse Spruce FNP

## 2017-05-17 LAB — RESPIRATORY VIRUS PANEL

## 2017-05-20 MED FILL — traMADol HCL 50 MG TABS: 50 | 10 days supply | Qty: 30 | Fill #0

## 2017-05-24 ENCOUNTER — Ambulatory Visit (HOSPITAL_COMMUNITY)
Admission: RE | Admit: 2017-05-24 | Discharge: 2017-05-24 | Disposition: A | Discharge status: Home / Self Care | Payer: Self-pay | Source: Ambulatory Visit | Attending: Family Medicine | Admitting: Family Medicine

## 2017-05-24 DIAGNOSIS — J40 Bronchitis, not specified as acute or chronic: Secondary | ICD-10-CM | POA: Insufficient documentation

## 2017-05-24 DIAGNOSIS — M75101 Unspecified rotator cuff tear or rupture of right shoulder, not specified as traumatic: Secondary | ICD-10-CM | POA: Insufficient documentation

## 2017-05-30 ENCOUNTER — Ambulatory Visit: Payer: No Typology Code available for payment source | Attending: Family Medicine | Admitting: Pharmacist

## 2017-05-30 DIAGNOSIS — E119 Type 2 diabetes mellitus without complications: Secondary | ICD-10-CM

## 2017-05-30 DIAGNOSIS — E1165 Type 2 diabetes mellitus with hyperglycemia: Secondary | ICD-10-CM | POA: Insufficient documentation

## 2017-05-30 NOTE — Patient Instructions (Addendum)
Thanks for coming to see me!  Start the glipizide.  Tell the pharmacy you need a refill on your test strips. You have 11 refills left there.  Come back in 2 weeks or sooner if you have any blood sugars under 80.   Hypoglycemia Hypoglycemia is when the sugar (glucose) level in the blood is too low. Symptoms of low blood sugar may include:  Feeling: ? Hungry. ? Worried or nervous (anxious). ? Sweaty and clammy. ? Confused. ? Dizzy. ? Sleepy. ? Sick to your stomach (nauseous).  Having: ? A fast heartbeat. ? A headache. ? A change in your vision. ? Jerky movements that you cannot control (seizure). ? Nightmares. ? Tingling or no feeling (numbness) around the mouth, lips, or tongue.  Having trouble with: ? Talking. ? Paying attention (concentrating). ? Moving (coordination). ? Sleeping.  Shaking.  Passing out (fainting).  Getting upset easily (irritability).  Low blood sugar can happen to people who have diabetes and people who do not have diabetes. Low blood sugar can happen quickly, and it can be an emergency. Treating Low Blood Sugar Low blood sugar is often treated by eating or drinking something sugary right away. If you can think clearly and swallow safely, follow the 15:15 rule:  Take 15 grams of a fast-acting carb (carbohydrate). Some fast-acting carbs are: ? 1 tube of glucose gel. ? 3 sugar tablets (glucose pills). ? 6-8 pieces of hard candy. ? 4 oz (120 mL) of fruit juice. ? 4 oz (120 mL) of regular (not diet) soda.  Check your blood sugar 15 minutes after you take the carb.  If your blood sugar is still at or below 70 mg/dL (3.9 mmol/L), take 15 grams of a carb again.  If your blood sugar does not go above 70 mg/dL (3.9 mmol/L) after 3 tries, get help right away.  After your blood sugar goes back to normal, eat a meal or a snack within 1 hour.  Treating Very Low Blood Sugar If your blood sugar is at or below 54 mg/dL (3 mmol/L), you have very low  blood sugar (severe hypoglycemia). This is an emergency. Do not wait to see if the symptoms will go away. Get medical help right away. Call your local emergency services (911 in the [place] Do not drive yourself to the hospital. If you have very low blood sugar and you cannot eat or drink, you may need a glucagon shot (injection). A family member or friend should learn how to check your blood sugar and how to give you a glucagon shot. Ask your doctor if you need to have a glucagon shot kit at home. Follow these instructions at home: General instructions  Avoid any diets that cause you to not eat enough food. Talk with your doctor before you start any new diet.  Take over-the-counter and prescription medicines only as told by your doctor.  Limit alcohol to no more than 1 drink per day for nonpregnant women and 2 drinks per day for men. One drink equals 12 oz of beer, 5 oz of wine, or 1 oz of hard liquor.  Keep all follow-up visits as told by your doctor. This is important. If You Have Diabetes:   Make sure you know the symptoms of low blood sugar.  Always keep a source of sugar with you, such as: ? Sugar. ? Sugar tablets. ? Glucose gel. ? Fruit juice. ? Regular soda (not diet soda). ? Milk. ? Hard candy. ? Honey.  Take your medicines  as told.  Follow your exercise and meal plan. ? Eat on time. Do not skip meals. ? Follow your sick day plan when you cannot eat or drink normally. Make this plan ahead of time with your doctor.  Check your blood sugar as often as told by your doctor. Always check before and after exercise.  Share your diabetes care plan with: ? Your work or school. ? People you live with.  Check your pee (urine) for ketones: ? When you are sick. ? As told by your doctor.  Carry a card or wear jewelry that says you have diabetes. If You Have Low Blood Sugar From Other Causes:   Check your blood sugar as often as told by your doctor.  Follow instructions  from your doctor about what you cannot eat or drink. Contact a doctor if:  You have trouble keeping your blood sugar in your target range.  You have low blood sugar often. Get help right away if:  You still have symptoms after you eat or drink something sugary.  Your blood sugar is at or below 54 mg/dL (3 mmol/L).  You have jerky movements that you cannot control.  You pass out. These symptoms may be an emergency. Do not wait to see if the symptoms will go away. Get medical help right away. Call your local emergency services (911 in the [place] Do not drive yourself to the hospital. This information is not intended to replace advice given to you by your health care provider. Make sure you discuss any questions you have with your health care provider. Document Released: 02/06/2010 Document Revised: 04/19/2016 Document Reviewed: 12/16/2015 Elsevier Interactive Patient Education  [employer].

## 2017-05-30 NOTE — Progress Notes (Signed)
    S:     Chief Complaint  Patient presents with  . Medication Management    Patient arrives in good spirits.  Presents for diabetes evaluation, education, and management at the request of [employer]/Dr. Jegede. Patient was referred on 05/16/17.  Patient was last seen by Primary Care Provider on 05/16/17.   Patient denies adherence with medications. He did not start the metformin XR but has continued his regular metformin. He never picked up the glipizide because he did not know what it was for. Current diabetes medications include: metformin XR 500 mg 2 tablets BID, glipizide 2.5 mg daily.   Patient denies hypoglycemic events.  Patient reported exercise habits: walks   Patient reports nocturia.  Patient reports neuropathy. Patient denies visual changes. Patient denies self foot exams.    O:  Physical Exam   ROS   Lab Results  Component Value Date   HGBA1C 10.5 04/05/2017   There were no vitals filed for this visit.  Home fasting CBG: 100s-150s  2 hour post-prandial/random CBG: 150s-230s  A/P: Diabetes longstanding currently uncontrolled based on A1c of 10.5. Patient denies hypoglycemic events and is able to verbalize appropriate hypoglycemia management plan. Patient denies adherence with medication. Control is suboptimal due to dietary indiscretion and sedentary lifestyle.  Patient will pick up glipizide and start it. He can finish out his regular metformin since he denies GI upset. Patient to continue to check CBGs and bring meter to next visit. Educated patient on his medications and what they were for.   Next A1C anticipated August 2018.    Written patient instructions provided.  Total time in face to face counseling 15 minutes.   Follow up in Pharmacist Clinic Visit in 2 weeks.

## 2017-06-03 ENCOUNTER — Telehealth: Payer: Self-pay | Admitting: Family Medicine

## 2017-06-03 ENCOUNTER — Telehealth: Payer: Self-pay

## 2017-06-03 NOTE — Telephone Encounter (Signed)
Pt returning call for results, states he missed previous call. Please f/u.

## 2017-06-03 NOTE — Telephone Encounter (Signed)
CMA call regarding x ray results  Patient did not answer but left a detailed message per DPR & if have any questions just to call back

## 2017-06-03 NOTE — Telephone Encounter (Signed)
-----   Message from Lizbeth BarkMandesia R Hairston, FNP sent at 05/31/2017  5:28 PM EDT ----- X-ray showed healed left rib fracture. Lungs clear. Heart normal.  Shoulder x-ray is normal. Showed no evidence of fracture or dislocation.

## 2017-06-03 NOTE — Telephone Encounter (Signed)
CMA call regarding results  Patient verify DOB  Patient was aware and understood  

## 2017-06-06 ENCOUNTER — Encounter: Payer: Self-pay | Admitting: Pulmonary Disease

## 2017-06-06 ENCOUNTER — Other Ambulatory Visit: Payer: Self-pay | Admitting: Family Medicine

## 2017-06-06 ENCOUNTER — Ambulatory Visit (INDEPENDENT_AMBULATORY_CARE_PROVIDER_SITE_OTHER): Payer: No Typology Code available for payment source | Admitting: Pulmonary Disease

## 2017-06-06 VITALS — BP 128/82 | HR 112 | Ht 67.0 in | Wt 184.2 lb

## 2017-06-06 DIAGNOSIS — R0609 Other forms of dyspnea: Secondary | ICD-10-CM

## 2017-06-06 DIAGNOSIS — R05 Cough: Secondary | ICD-10-CM

## 2017-06-06 DIAGNOSIS — I10 Essential (primary) hypertension: Secondary | ICD-10-CM

## 2017-06-06 DIAGNOSIS — R938 Abnormal findings on diagnostic imaging of other specified body structures: Secondary | ICD-10-CM

## 2017-06-06 DIAGNOSIS — R059 Cough, unspecified: Secondary | ICD-10-CM

## 2017-06-06 DIAGNOSIS — R9389 Abnormal findings on diagnostic imaging of other specified body structures: Secondary | ICD-10-CM

## 2017-06-06 MED ORDER — TIOTROPIUM BROMIDE MONOHYDRATE 2.5 MCG/ACT IN AERS: 2.0000 | INHALATION_SPRAY | Freq: Every day | RESPIRATORY_TRACT | 0 refills | Status: DC

## 2017-06-06 MED ORDER — LOSARTAN POTASSIUM 25 MG PO TABS: 25.0000 mg | ORAL_TABLET | Freq: Every day | ORAL | 2 refills | Status: DC

## 2017-06-06 MED FILL — LOSARTAN POTASSIUM 25 MG TA: 25 | 30 days supply | Qty: 30 | Fill #0

## 2017-06-06 NOTE — Patient Instructions (Addendum)
For your cough: Stopped taking lisinopril I'll send a message to your primary care physician to prescribe something else  For your shortness of breath: You need to gradually increase her activity We will order a lung function test in order to make sure there is no evidence of an underlying lung disease We'll also order a high-resolution CT scan of the chest considering the abnormal findings on the chest x-ray from earlier this year  We'll see you back in about 4-6 weeks with either myself or a nurse practitioner regarding the results of the lung testing

## 2017-06-06 NOTE — Progress Notes (Signed)
Subjective:    Patient ID: Adrian Schultz, male    DOB: 03/23/1966, 51 y.o.   MRN: [id number]  Synopsis: Referred in July 2018 for evaluation of shortness of breath and possible ILD.  HPI Chief Complaint  Patient presents with  . Pulmonary Consult    Pt referred by Dr. Braulio Conte for ILD. Pt c/o DOE x 6-8 months, nonprod cough with chest congestion x 3 years. Pt denies CP/tightness and f/c/s.      Adrian Schultz says that he has been having some trouble breathing: > when in heat and humidity he has to stop walking after 20-30 yards, more likely if there is an incline. > sometimes he has a hard time catch his breath > he says that if he tries talking too much he will have a hard time breathing > he says that this started in February 2018 after he got sick > he has lost a little weight which helped > he says that he can no longer  > He says that illness in February was fairly severe and associated with muscle stiffness  > he has an associated dry cough, though he produces clear mucus most mornings.  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  He rarely uses a mask, sometimes he will wear one.  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    When he was a child he had no breathing difficulty.    He has used smokeless tobacco for years, but he has never smoked cigarettes.  Both parents died of emphysema after smoking for years.  He has had pneumonia and bronchitis in February this year.     Past Medical History:  Diagnosis Date  . Diabetes mellitus without complication ([place])   . ETOH abuse   . Hypercholesteremia   . Hypertension   . Suicidal ideation      History reviewed. No pertinent family history.   Social History   Social History  . Marital status: Single    Spouse name: N/A  . Number of children: N/A  . Years of education: N/A   Occupational History  . Unemployeed    Social History Main Topics  .  Smoking status: Never Smoker  . Smokeless tobacco: Current User    Types: Snuff     Comment: daily since 51 years old  . Alcohol use 0.0 oz/week     Comment: beer - daily 1/2 a case   . Drug use: No  . Sexual activity: Not Currently   Other Topics Concern  . Not on file   Social History Narrative   Live in shelter - [employer]         No Known Allergies   Outpatient Medications Prior to Visit  Medication Sig Dispense Refill  . acetaminophen (TYLENOL) 500 MG tablet Take 2 tablets (1,000 mg total) by mouth every 8 (eight) hours as needed for moderate pain. 30 tablet 1  . albuterol (PROVENTIL HFA;VENTOLIN HFA) 108 (90 Base) MCG/ACT inhaler Inhale 2 puffs into the lungs every 6 (six) hours as needed for wheezing or shortness of breath. 1 Inhaler 1  . Blood Glucose Monitoring Suppl (TRUE METRIX METER) w/Device KIT 1 each by Does not apply route daily. 1 kit 0  . glucose blood (TRUE METRIX BLOOD GLUCOSE TEST) test strip Use as instructed 100 each 12  . ibuprofen (ADVIL,MOTRIN) 800 MG tablet Take 1 tablet (800  mg total) by mouth 3 (three) times daily. (Patient taking differently: Take 800 mg by mouth every 6 (six) hours as needed. ) 12 tablet 0  . lisinopril (PRINIVIL) 10 MG tablet Take 1 tablet (10 mg total) by mouth daily. 30 tablet 3  . meloxicam (MOBIC) 7.5 MG tablet Take 1 tablet (7.5 mg total) by mouth daily. 30 tablet 1  . metFORMIN (GLUCOPHAGE XR) 500 MG 24 hr tablet Take 2 tablets (1,000 mg total) by mouth 2 (two) times daily with a meal. 120 tablet 3  . traMADol (ULTRAM) 50 MG tablet Take 1 tablet (50 mg total) by mouth every 8 (eight) hours as needed for severe pain. 30 tablet 0  . TRUEPLUS LANCETS 28G MISC 1 each by Does not apply route daily. 100 each 11  . glipiZIDE (GLUCOTROL) 5 MG tablet Take half a tablet by mouth daily before breakfast. (Patient not taking: Reported on 06/06/2017) 30 tablet 2  . guaiFENesin (MUCINEX) 600 MG 12 hr tablet Take 1 tablet (600 mg total) by  mouth 2 (two) times daily. (Patient not taking: Reported on 06/06/2017) 14 tablet 0  . guaiFENesin-dextromethorphan (ROBITUSSIN DM) 100-10 MG/5ML syrup Take 5 mLs by mouth every 4 (four) hours as needed for cough. (Patient not taking: Reported on 06/06/2017) 118 mL 0  . pravastatin (PRAVACHOL) 20 MG tablet Take 1 tablet (20 mg total) by mouth daily. (Patient not taking: Reported on 06/06/2017) 90 tablet 0  . azithromycin (ZITHROMAX) 250 MG tablet Day one take 2 tablets (500 mg total) by mouth. Take 1 tablet once daily. 6 tablet 0   Facility-Administered Medications Prior to Visit  Medication Dose Route Frequency Provider Last Rate Last Dose  . ipratropium-albuterol (DUONEB) 0.5-2.5 (3) MG/3ML nebulizer solution 3 mL  3 mL Nebulization Q20 Min PRN Fredia Beets R, FNP   3 mL at 05/16/17 1500      Review of Systems  Constitutional: Negative for fever and unexpected weight change.  HENT: Negative for congestion, dental problem, ear pain, nosebleeds, postnasal drip, rhinorrhea, sinus pressure, sneezing, sore throat and trouble swallowing.   Eyes: Negative for redness and itching.  Respiratory: Positive for cough and shortness of breath. Negative for chest tightness and wheezing.   Cardiovascular: Negative for palpitations and leg swelling.  Gastrointestinal: Negative for nausea and vomiting.  Genitourinary: Negative for dysuria.  Musculoskeletal: Negative for joint swelling.  Skin: Negative for rash.  Neurological: Negative for headaches.  Hematological: Does not bruise/bleed easily.  Psychiatric/Behavioral: Negative for dysphoric mood. The patient is not nervous/anxious.        Objective:   Physical Exam Vitals:   06/06/17 1012  BP: 128/82  Pulse: (!) 112  SpO2: 97%  Weight: 184 lb 3.2 oz (83.6 kg)  Height: 5' 7"  (1.702 m)   Gen: well appearing, no acute distress HENT: NCAT, OP clear, neck supple without masses Eyes: PERRL, EOMi Lymph: no cervical lymphadenopathy PULM: CTA  B CV: RRR, no mgr, no JVD GI: BS+, soft, nontender, no hsm Derm: no rash or skin breakdown MSK: normal bulk and tone Neuro: A&Ox4, CN II-XII intact, strength 5/5 in all 4 extremities Psyche: normal mood and affect  CBC    Component Value Date/Time   WBC 16.8 (H) 03/16/2017 2126   RBC 4.55 03/16/2017 2126   HGB 15.3 03/16/2017 2132   HCT 45.0 03/16/2017 2132   PLT 195 03/16/2017 2126   MCV 93.4 03/16/2017 2126   MCH 33.0 03/16/2017 2126   MCHC 35.3 03/16/2017 2126   RDW  12.9 03/16/2017 2126   LYMPHSABS 1.5 03/16/2017 2126   MONOABS 0.9 03/16/2017 2126   EOSABS 0.1 03/16/2017 2126   BASOSABS 0.0 03/16/2017 2126   Records from his visit with the community health and [place] reviewed were he was cared for for diabetes and hypertension  Chest imaging: 2017 CT scan of the chest images independently reviewed showing normal pulmonary parenchyma and airways, no evidence of ILD February 2018 chest x-ray images independently reviewed showing possibly a mild reticular infiltrate in the bases     Assessment & Plan:  Dyspnea on exertion - Plan: Pulmonary Function Test, CT CHEST HIGH RESOLUTION  Abnormal CXR  Cough   Discussion: Adrian Schultz has been referred to me for evaluation of cough and shortness of breath. This all occurred after having a severe episode of what sounds like influenza in February 2018. A chest x-ray was performed this year and was interpreted as having mild chronic ILD. I think that is less likely based on his normal physical exam and the fact that a CT scan of his chest in 2017 was normal. However, he has had significant symptoms so it's worthwhile to assess for underlying lung disease with a lung function testing repeating a high-resolution CT scan to pay particular attention to the possibility of underlying ILD.  That all being said, I think the most likely expiration for his symptoms are deconditioning after the severe illness and cough due to lisinopril. I've  asked him to stop taking the lisinopril and follow-up with his primary care physician.  Plan: For your cough: Stopped taking lisinopril I'll send a message to your primary care physician to prescribe something else  For your shortness of breath: You need to gradually increase her activity We will order a lung function test in order to make sure there is no evidence of an underlying lung disease We'll also order a high-resolution CT scan of the chest considering the abnormal findings on the chest x-ray from earlier this year  We'll see you back in about 4-6 weeks with either myself or a nurse practitioner regarding the results of the lung testing  Current Outpatient Prescriptions:  .  acetaminophen (TYLENOL) 500 MG tablet, Take 2 tablets (1,000 mg total) by mouth every 8 (eight) hours as needed for moderate pain., Disp: 30 tablet, Rfl: 1 .  albuterol (PROVENTIL HFA;VENTOLIN HFA) 108 (90 Base) MCG/ACT inhaler, Inhale 2 puffs into the lungs every 6 (six) hours as needed for wheezing or shortness of breath., Disp: 1 Inhaler, Rfl: 1 .  Blood Glucose Monitoring Suppl (TRUE METRIX METER) w/Device KIT, 1 each by Does not apply route daily., Disp: 1 kit, Rfl: 0 .  glucose blood (TRUE METRIX BLOOD GLUCOSE TEST) test strip, Use as instructed, Disp: 100 each, Rfl: 12 .  ibuprofen (ADVIL,MOTRIN) 800 MG tablet, Take 1 tablet (800 mg total) by mouth 3 (three) times daily. (Patient taking differently: Take 800 mg by mouth every 6 (six) hours as needed. ), Disp: 12 tablet, Rfl: 0 .  lisinopril (PRINIVIL) 10 MG tablet, Take 1 tablet (10 mg total) by mouth daily., Disp: 30 tablet, Rfl: 3 .  meloxicam (MOBIC) 7.5 MG tablet, Take 1 tablet (7.5 mg total) by mouth daily., Disp: 30 tablet, Rfl: 1 .  metFORMIN (GLUCOPHAGE XR) 500 MG 24 hr tablet, Take 2 tablets (1,000 mg total) by mouth 2 (two) times daily with a meal., Disp: 120 tablet, Rfl: 3 .  traMADol (ULTRAM) 50 MG tablet, Take 1 tablet (50 mg total) by mouth  every 8 (eight) hours as needed for severe pain., Disp: 30 tablet, Rfl: 0 .  TRUEPLUS LANCETS 28G MISC, 1 each by Does not apply route daily., Disp: 100 each, Rfl: 11 .  glipiZIDE (GLUCOTROL) 5 MG tablet, Take half a tablet by mouth daily before breakfast. (Patient not taking: Reported on 06/06/2017), Disp: 30 tablet, Rfl: 2 .  guaiFENesin (MUCINEX) 600 MG 12 hr tablet, Take 1 tablet (600 mg total) by mouth 2 (two) times daily. (Patient not taking: Reported on 06/06/2017), Disp: 14 tablet, Rfl: 0 .  guaiFENesin-dextromethorphan (ROBITUSSIN DM) 100-10 MG/5ML syrup, Take 5 mLs by mouth every 4 (four) hours as needed for cough. (Patient not taking: Reported on 06/06/2017), Disp: 118 mL, Rfl: 0 .  pravastatin (PRAVACHOL) 20 MG tablet, Take 1 tablet (20 mg total) by mouth daily. (Patient not taking: Reported on 06/06/2017), Disp: 90 tablet, Rfl: 0  Current Facility-Administered Medications:  .  ipratropium-albuterol (DUONEB) 0.5-2.5 (3) MG/3ML nebulizer solution 3 mL, 3 mL, Nebulization, Q20 Min PRN, Hairston, Mandesia R, FNP, 3 mL at 05/16/17 1500

## 2017-06-13 ENCOUNTER — Ambulatory Visit: Payer: Self-pay | Admitting: Pharmacist

## 2017-06-13 ENCOUNTER — Telehealth: Payer: Self-pay | Admitting: Family Medicine

## 2017-06-13 ENCOUNTER — Ambulatory Visit (INDEPENDENT_AMBULATORY_CARE_PROVIDER_SITE_OTHER)
Admission: RE | Admit: 2017-06-13 | Discharge: 2017-06-13 | Disposition: A | Discharge status: Home / Self Care | Payer: No Typology Code available for payment source | Source: Ambulatory Visit | Attending: Pulmonary Disease | Admitting: Pulmonary Disease

## 2017-06-13 DIAGNOSIS — R0609 Other forms of dyspnea: Secondary | ICD-10-CM

## 2017-06-13 MED FILL — TRUE METRIX TEST STRIP: 30 days supply | Qty: 100 | Fill #2

## 2017-06-13 NOTE — Progress Notes (Deleted)
    S:     No chief complaint on file.   Patient arrives in good spirits.  Presents for diabetes evaluation, education, and management at the request of [facility]Dr. Jegede. Patient was referred on 05/16/17.  Patient was last seen by Primary Care Provider on 05/16/17.   Patient denies adherence with medications. He did not start the metformin XR but has continued his regular metformin. He never picked up the glipizide because he did not know what it was for. Current diabetes medications include: metformin XR 500 mg 2 tablets BID, glipizide 2.5 mg daily.   Patient denies hypoglycemic events.  Patient reported exercise habits: walks   Patient reports nocturia.  Patient reports neuropathy. Patient denies visual changes. Patient denies self foot exams.    O:  Physical Exam   ROS   Lab Results  Component Value Date   HGBA1C 10.5 04/05/2017   There were no vitals filed for this visit.  Home fasting CBG: 100s-150s  2 hour post-prandial/random CBG: 150s-230s  A/P: Diabetes longstanding currently uncontrolled based on A1c of 10.5. Patient denies hypoglycemic events and is able to verbalize appropriate hypoglycemia management plan. Patient denies adherence with medication. Control is suboptimal due to dietary indiscretion and sedentary lifestyle.  Patient will pick up glipizide and start it. He can finish out his regular metformin since he denies GI upset. Patient to continue to check CBGs and bring meter to next visit. Educated patient on his medications and what they were for.   Next A1C anticipated August 2018.    Written patient instructions provided.  Total time in face to face counseling 15 minutes.   Follow up in Pharmacist Clinic Visit in 2 weeks.

## 2017-06-13 NOTE — Telephone Encounter (Signed)
Pt came in to request a refill of Tramadol and Meloxicam. Please f/u. Thank you.

## 2017-06-14 ENCOUNTER — Other Ambulatory Visit: Payer: Self-pay | Admitting: Family Medicine

## 2017-06-14 DIAGNOSIS — G8929 Other chronic pain: Secondary | ICD-10-CM

## 2017-06-14 DIAGNOSIS — M25511 Pain in right shoulder: Principal | ICD-10-CM

## 2017-06-14 DIAGNOSIS — M75101 Unspecified rotator cuff tear or rupture of right shoulder, not specified as traumatic: Secondary | ICD-10-CM

## 2017-06-14 MED ORDER — MELOXICAM 7.5 MG PO TABS: 7.5000 mg | ORAL_TABLET | Freq: Every day | ORAL | 1 refills | Status: DC

## 2017-06-14 MED ORDER — ACETAMINOPHEN 500 MG PO TABS: 1000.0000 mg | ORAL_TABLET | Freq: Three times a day (TID) | ORAL | 1 refills | Status: DC | PRN

## 2017-06-14 MED FILL — MELOXICAM 7.5 MG TABLET: 7.5 | 30 days supply | Qty: 30 | Fill #0

## 2017-06-14 NOTE — Telephone Encounter (Signed)
CMA call regarding medication   Patient verify DOB  Patient was aware and understood

## 2017-06-14 NOTE — Telephone Encounter (Signed)
Meloxicam refilled. Tramadol will not be refilled without office visit. Tylenol extra strength was ordered.

## 2017-06-17 ENCOUNTER — Other Ambulatory Visit: Payer: Self-pay | Admitting: Internal Medicine

## 2017-06-17 ENCOUNTER — Encounter: Payer: Self-pay | Admitting: Pulmonary Disease

## 2017-06-17 DIAGNOSIS — J479 Bronchiectasis, uncomplicated: Secondary | ICD-10-CM | POA: Insufficient documentation

## 2017-06-17 DIAGNOSIS — M75101 Unspecified rotator cuff tear or rupture of right shoulder, not specified as traumatic: Secondary | ICD-10-CM

## 2017-06-18 NOTE — Telephone Encounter (Signed)
CMA call regarding medication prescription ready to be pick up at front desk   Patient verify DOB  Patient was aware and understood

## 2017-06-19 MED FILL — traMADol HCL 50 MG TABS: 50 | 10 days supply | Qty: 30 | Fill #0

## 2017-06-20 ENCOUNTER — Ambulatory Visit: Payer: Self-pay | Attending: Family Medicine | Admitting: Pharmacist

## 2017-06-20 DIAGNOSIS — E118 Type 2 diabetes mellitus with unspecified complications: Secondary | ICD-10-CM | POA: Insufficient documentation

## 2017-06-20 DIAGNOSIS — E119 Type 2 diabetes mellitus without complications: Secondary | ICD-10-CM

## 2017-06-20 DIAGNOSIS — M25519 Pain in unspecified shoulder: Secondary | ICD-10-CM | POA: Insufficient documentation

## 2017-06-20 NOTE — Progress Notes (Signed)
    S:     No chief complaint on file.   Patient arrives in good spirits.  Presents for diabetes evaluation, education, and management [facility]. Jegede. Patient was referred on 05/16/17.  Patient was last seen by Primary Care Provider on 05/16/17.   Patient reports adherence with medications.  Current diabetes medications include: metformin XR 500 mg 2 tablets BID, glipizide 2.5 mg daily.   Patient denies hypoglycemic events.  Patient reported exercise habits: walks   Patient denies nocturia, he has noticed a great improvement since starting glipizide. He also has noticed that he is less thirsty. Patient reports neuropathy. Patient denies visual changes. Patient denies self foot exams.    O:  Physical Exam   ROS   Lab Results  Component Value Date   HGBA1C 10.5 04/05/2017   There were no vitals filed for this visit.  Home fasting CBG: 100s-120s 2 hour post-prandial/random CBG: 150s-190s, rare 200  A/P: Diabetes longstanding currently uncontrolled based on A1c of 10.5. Patient denies hypoglycemic events and is able to verbalize appropriate hypoglycemia management plan. Patient reports adherence with medication. Control is suboptimal due to dietary indiscretion and sedentary lifestyle.  Continue current medications as prescribed. Congratulated patient on progress, he has really done great! Encouraged him to come see me if he starts to see his numbers increasing, especially seeing multiple readings at 200 or greater. Otherwise patient [facility].  Patient with shoulder pain, would like further evaluation, [facility]  Next A1C anticipated August 2018.    Written patient instructions provided.  Total time in face to face counseling 15 minutes.   Follow up in Pharmacist Clinic Visit PRN.

## 2017-06-20 NOTE — Patient Instructions (Addendum)
Thanks for coming to see me!  You are doing great, keep up the great work!  Come back and see me if you have low blood sugars (less than 80) or a lot of high blood sugars (higher than 200)  Follow up with Mandesia in 1-2 weeks for pain

## 2017-07-04 ENCOUNTER — Encounter: Payer: Self-pay | Admitting: Family Medicine

## 2017-07-04 ENCOUNTER — Ambulatory Visit: Payer: No Typology Code available for payment source | Attending: Family Medicine | Admitting: Family Medicine

## 2017-07-04 VITALS — BP 138/86 | HR 98 | Temp 98.2°F | Resp 18 | Ht 66.0 in | Wt 187.4 lb

## 2017-07-04 DIAGNOSIS — G8929 Other chronic pain: Secondary | ICD-10-CM | POA: Insufficient documentation

## 2017-07-04 DIAGNOSIS — E7800 Pure hypercholesterolemia, unspecified: Secondary | ICD-10-CM | POA: Insufficient documentation

## 2017-07-04 DIAGNOSIS — E119 Type 2 diabetes mellitus without complications: Secondary | ICD-10-CM | POA: Insufficient documentation

## 2017-07-04 DIAGNOSIS — Z79899 Other long term (current) drug therapy: Secondary | ICD-10-CM | POA: Insufficient documentation

## 2017-07-04 DIAGNOSIS — Z79891 Long term (current) use of opiate analgesic: Secondary | ICD-10-CM | POA: Insufficient documentation

## 2017-07-04 DIAGNOSIS — F17200 Nicotine dependence, unspecified, uncomplicated: Secondary | ICD-10-CM

## 2017-07-04 DIAGNOSIS — I10 Essential (primary) hypertension: Secondary | ICD-10-CM

## 2017-07-04 DIAGNOSIS — F17210 Nicotine dependence, cigarettes, uncomplicated: Secondary | ICD-10-CM | POA: Insufficient documentation

## 2017-07-04 DIAGNOSIS — M25511 Pain in right shoulder: Secondary | ICD-10-CM | POA: Insufficient documentation

## 2017-07-04 DIAGNOSIS — Z7984 Long term (current) use of oral hypoglycemic drugs: Secondary | ICD-10-CM | POA: Insufficient documentation

## 2017-07-04 DIAGNOSIS — N529 Male erectile dysfunction, unspecified: Secondary | ICD-10-CM | POA: Insufficient documentation

## 2017-07-04 LAB — GLUCOSE, POCT (MANUAL RESULT ENTRY): POC GLUCOSE: 255 mg/dL — AB (ref 70–99)

## 2017-07-04 LAB — POCT GLYCOSYLATED HEMOGLOBIN (HGB A1C): Hemoglobin A1C: 9.4

## 2017-07-04 MED ORDER — METFORMIN HCL ER 500 MG PO TB24: 1000.0000 mg | ORAL_TABLET | Freq: Two times a day (BID) | ORAL | 3 refills | Status: DC

## 2017-07-04 MED ORDER — GLIPIZIDE 10 MG PO TABS: ORAL_TABLET | ORAL | 3 refills | Status: DC

## 2017-07-04 MED ORDER — IBUPROFEN 600 MG PO TABS
600.0000 mg | ORAL_TABLET | Freq: Four times a day (QID) | ORAL | 1 refills | Status: DC | PRN
Start: 2017-07-04 — End: 2017-10-17

## 2017-07-04 MED ORDER — LOSARTAN POTASSIUM 25 MG PO TABS: 25.0000 mg | ORAL_TABLET | Freq: Every day | ORAL | 2 refills | Status: DC

## 2017-07-04 MED ORDER — NICOTINE POLACRILEX 4 MG MT GUM: 4.0000 mg | CHEWING_GUM | OROMUCOSAL | 1 refills | Status: DC | PRN

## 2017-07-04 MED ORDER — SILDENAFIL CITRATE 25 MG PO TABS: 25.0000 mg | ORAL_TABLET | Freq: Every day | ORAL | 11 refills | Status: DC | PRN

## 2017-07-04 MED FILL — ?GLIPIZIDE 10 MG TABLET: 10 | 60 days supply | Qty: 30 | Fill #0

## 2017-07-04 MED FILL — LOSARTAN POTASSIUM 25 MG TA: 25 | 30 days supply | Qty: 30 | Fill #0

## 2017-07-04 MED FILL — IBUPROFEN 600 MG TABLET: 600 | 8 days supply | Qty: 30 | Fill #0

## 2017-07-04 MED FILL — METFORMIN HCL ER 500 MG TAB: 500 | 30 days supply | Qty: 120 | Fill #0

## 2017-07-04 NOTE — Patient Instructions (Addendum)
Shoulder Pain Many things can cause shoulder pain, including:  An injury to the area.  Overuse of the shoulder.  Arthritis.  The source of the pain can be:  Inflammation.  An injury to the shoulder joint.  An injury to a tendon, ligament, or bone.  Follow these instructions at home: Take these actions to help with your pain:  Squeeze a soft ball or a foam pad as much as possible. This helps to keep the shoulder from swelling. It also helps to strengthen the arm.  Take over-the-counter and prescription medicines only as told by your health care provider.  If directed, apply ice to the area: ? Put ice in a plastic bag. ? Place a towel between your skin and the bag. ? Leave the ice on for 20 minutes, 2-3 times per day. Stop applying ice if it does not help with the pain.  If you were given a shoulder sling or immobilizer: ? Wear it as told. ? Remove it to shower or bathe. ? Move your arm as little as possible, but keep your hand moving to prevent swelling.  Contact a health care provider if:  Your pain gets worse.  Your pain is not relieved with medicines.  New pain develops in your arm, hand, or fingers. Get help right away if:  Your arm, hand, or fingers: ? Tingle. ? Become numb. ? Become swollen. ? Become painful. ? Turn white or blue. This information is not intended to replace advice given to you by your health care provider. Make sure you discuss any questions you have with your health care provider. Document Released: 08/22/2005 Document Revised: 07/08/2016 Document Reviewed: 03/07/2015 Elsevier Interactive Patient Education  2017 Elsevier Inc.   Type 2 Diabetes Mellitus, Self Care, Adult When you have type 2 diabetes (type 2 diabetes mellitus), you must keep your blood sugar (glucose) under control. You can do this with:  Nutrition.  Exercise.  Lifestyle changes.  Medicines or insulin, if needed.  Support from your doctors and others.  How do I  manage my blood sugar?  Check your blood sugar level every day, as often as told.  Call your doctor if your blood sugar is above your goal numbers for 2 tests in a row.  Have your A1c (hemoglobin A1c) level checked at least two times a year. Have it checked more often if your doctor tells you to. Your doctor will set treatment goals for you. Generally, you should have these blood sugar levels:  Before meals (preprandial): 80-130 mg/dL (4.4-7.2 mmol/L).  After meals (postprandial): lower than 180 mg/dL (10 mmol/L).  A1c level: less than 7%.  What do I need to know about high blood sugar? High blood sugar is called hyperglycemia. Know the signs of high blood sugar. Signs may include:  Feeling: ? Thirsty. ? Hungry. ? Very tired.  Needing to pee (urinate) more than usual.  Blurry vision.  What do I need to know about low blood sugar? Low blood sugar is called hypoglycemia. This is when blood sugar is at or below 70 mg/dL (3.9 mmol/L). Symptoms may include:  Feeling: ? Hungry. ? Worried or nervous (anxious). ? Sweaty and clammy. ? Confused. ? Dizzy. ? Sleepy. ? Sick to your stomach (nauseous).  Having: ? A fast heartbeat (palpitations). ? A headache. ? A change in your vision. ? Jerky movements that you cannot control (seizure). ? Nightmares. ? Tingling or no feeling (numbness) around the mouth, lips, or tongue.  Having trouble with: ? Talking. ?   Paying attention (concentrating). ? Moving (coordination). ? Sleeping.  Shaking.  Passing out (fainting).  Getting upset easily (irritability).  Treating low blood sugar  To treat low blood sugar, eat or drink something sugary right away. If you can think clearly and swallow safely, follow the 15:15 rule:  Take 15 grams of a fast-acting carb (carbohydrate). Some fast-acting carbs are: ? 1 tube of glucose gel. ? 3 sugar tablets (glucose pills). ? 6-8 pieces of hard candy. ? 4 oz (120 mL) of fruit juice. ? 4 oz  (120 mL) regular (not diet) soda.  Check your blood sugar 15 minutes after you take the carb.  If your blood sugar is still at or below 70 mg/dL (3.9 mmol/L), take 15 grams of a carb again.  If your blood sugar does not go above 70 mg/dL (3.9 mmol/L) after 3 tries, get help right away.  After your blood sugar goes back to normal, eat a meal or a snack within 1 hour.  Treating very low blood sugar If your blood sugar is at or below 54 mg/dL (3 mmol/L), you have very low blood sugar (severe hypoglycemia). This is an emergency. Do not wait to see if the symptoms will go away. Get medical help right away. Call your local emergency services (911 in the [place] Do not drive yourself to the hospital. If you have very low blood sugar and you cannot eat or drink, you may need a glucagon shot (injection). A family member or friend should learn how to check your blood sugar and how to give you a glucagon shot. Ask your doctor if you need to have a glucagon shot kit at home. What else is important to manage my diabetes? Medicine Follow these instructions about insulin and diabetes medicines:  Take them as told by your doctor.  Adjust them as told by your doctor.  Do not run out of them.  Having diabetes can raise your risk for other long-term conditions. These include heart or kidney disease. Your doctor may prescribe medicines to help prevent problems from diabetes. Food   Make healthy food choices. These include: ? Chicken, fish, egg whites, and beans. ? Oats, whole wheat, bulgur, brown rice, quinoa, and millet. ? Fresh fruits and vegetables. ? Low-fat dairy products. ? Nuts, avocado, olive oil, and canola oil.  Make a food plan with a specialist (dietitian).  Follow instructions from your doctor about what you cannot eat or drink.  Drink enough fluid to keep your pee (urine) clear or pale yellow.  Eat healthy snacks between healthy meals.  Keep track of carbs that you eat. Read food  labels. Learn food serving sizes.  Follow your sick day plan when you cannot eat or drink normally. Make this plan with your doctor so it is ready to use. Activity  Exercise at least 3 times a week.  Do not go more than 2 days without exercising.  Talk with your doctor before you start a new exercise. Your doctor may need to adjust your insulin, medicines, or food. Lifestyle   Do not use any tobacco products. These include cigarettes, chewing tobacco, and e-cigarettes.If you need help quitting, ask your doctor.  Ask your doctor how much alcohol is safe for you.  Learn to deal with stress. If you need help with this, ask your doctor. Body care  Stay up to date with your shots (immunizations).  Have your eyes and feet checked by a doctor as often as told.  Check your skin and   feet every day. Check for cuts, bruises, redness, blisters, or sores.  Brush your teeth and gums two times a day.  Floss at least one time a day.  Go to the dentist least one time every 6 months.  Stay at a healthy weight. General instructions   Take over-the-counter and prescription medicines only as told by your doctor.  Share your diabetes care plan with: ? Your work or school. ? People you live with.  Check your pee (urine) for ketones: ? When you are sick. ? As told by your doctor.  Carry a card or wear jewelry that says that you have diabetes.  Ask your doctor: ? Do I need to meet with a diabetes educator? ? Where can I find a support group for people with diabetes?  Keep all follow-up visits as told by your doctor. This is important. Where to find more information: To learn more about diabetes, visit:  American Diabetes Association: www.diabetes.org  American Association of Diabetes Educators: www.diabeteseducator.org/patient-resources  This information is not intended to replace advice given to you by your health care provider. Make sure you discuss any questions you have with  your health care provider. Document Released: 03/05/2016 Document Revised: 04/19/2016 Document Reviewed: 12/16/2015 Elsevier Interactive Patient Education  2018 Elsevier Inc.  

## 2017-07-04 NOTE — Progress Notes (Signed)
Subjective:  Patient ID: Adrian Schultz, male    DOB: 1966-04-28  Age: 51 y.o. MRN: [id number]  CC: Diabetes and Shoulder Pain   HPI DAXEN LANUM presents for right shoulder pain. He also complains of arthralgias for which has been present for 2 month. Pain is located in the right shoulder(s), is described as aching, and is moderate. Associated symptoms include: decreased ROM, burning pain, and right hand strength. Reports taking ibuprofen and tramadol for relief of symptoms. Previous work up included: xray. Related to injury: no. Patient presents for follow up of diabetes. Symptoms: none. Symptoms have been basically asymptomatic. Patient denies none.  Evaluation to date has been included: hemoglobin A1C.  Home sugars: CBG's 200's. Treatment to date: metformin and glipizide. He is not adherent with taking diabetic medications. History of HTN.  He is not exercising and is not adherent to low salt diet.  He does not check BP at home. Cardiac symptoms none. Patient denies chest pain, chest pressure/discomfort, claudication, dyspnea, lower extremity edema, near-syncope and syncope.  Cardiovascular risk factors: dyslipidemia, hypertension, male gender, microalbuminuria, sedentary lifestyle and smoking/ tobacco exposure.  He reports using tobacco dip for 14 years currently using 1 1/2 can per day. He reports he is ready to quit. Use of agents associated with hypertension: NSAIDS. History of target organ damage: none. He  complains of erectile dysfunction.  Onset of dysfunction was 4 years ago and was gradual in onset.  Patient states the nature of difficulty is both attaining and maintaining erection. Libido is not affected. Risk factors for ED include diabetes mellitus and antihypertensive medications. He denies any nitrate use. He denies any previous treatment of ED.    Outpatient Medications Prior to Visit  Medication Sig Dispense Refill  . acetaminophen (TYLENOL) 500 MG tablet Take 2 tablets (1,000 mg  total) by mouth every 8 (eight) hours as needed for moderate pain. 30 tablet 1  . albuterol (PROVENTIL HFA;VENTOLIN HFA) 108 (90 Base) MCG/ACT inhaler Inhale 2 puffs into the lungs every 6 (six) hours as needed for wheezing or shortness of breath. 1 Inhaler 1  . Blood Glucose Monitoring Suppl (TRUE METRIX METER) w/Device KIT 1 each by Does not apply route daily. 1 kit 0  . glucose blood (TRUE METRIX BLOOD GLUCOSE TEST) test strip Use as instructed 100 each 12  . meloxicam (MOBIC) 7.5 MG tablet Take 1 tablet (7.5 mg total) by mouth daily. 30 tablet 1  . pravastatin (PRAVACHOL) 20 MG tablet Take 1 tablet (20 mg total) by mouth daily. 90 tablet 0  . traMADol (ULTRAM) 50 MG tablet TAKE ONE TABLET BY MOUTH EVERY 8 HOURS AS NEEDED FOR SEVERE PAIN 30 tablet 0  . TRUEPLUS LANCETS 28G MISC 1 each by Does not apply route daily. 100 each 11  . glipiZIDE (GLUCOTROL) 5 MG tablet Take half a tablet by mouth daily before breakfast. 30 tablet 2  . ibuprofen (ADVIL,MOTRIN) 800 MG tablet Take 1 tablet (800 mg total) by mouth 3 (three) times daily. (Patient taking differently: Take 800 mg by mouth every 6 (six) hours as needed. ) 12 tablet 0  . losartan (COZAAR) 25 MG tablet Take 1 tablet (25 mg total) by mouth daily. 30 tablet 2  . metFORMIN (GLUCOPHAGE XR) 500 MG 24 hr tablet Take 2 tablets (1,000 mg total) by mouth 2 (two) times daily with a meal. 120 tablet 3   Facility-Administered Medications Prior to Visit  Medication Dose Route Frequency Provider Last Rate Last Dose  .  ipratropium-albuterol (DUONEB) 0.5-2.5 (3) MG/3ML nebulizer solution 3 mL  3 mL Nebulization Q20 Min PRN Hairston, Mandesia R, FNP   3 mL at 05/16/17 1500    ROS Review of Systems  Constitutional: Negative.   Eyes: Negative.   Respiratory: Negative.   Cardiovascular: Negative.   Endocrine: Negative.   Genitourinary:       Erectile dysfunction  Musculoskeletal: Positive for arthralgias.  Skin: Negative.   Neurological: Negative.     Psychiatric/Behavioral: Negative.    Objective:  BP 138/86 (BP Location: Left Arm, Patient Position: Sitting, Cuff Size: Normal)   Pulse 98   Temp 98.2 F (36.8 C) (Oral)   Resp 18   Ht 5' 6"  (1.676 m)   Wt 187 lb 6.4 oz (85 kg)   SpO2 98%   BMI 30.25 kg/m   BP/Weight 07/10/2017 07/10/2017 07/19/2352  Systolic BP 614 431 -  Diastolic BP 72 74 -  Wt. (Lbs) 188 - 180  BMI 31.05 29.05 -     Physical Exam  Constitutional: He appears well-developed and well-nourished.  Eyes: Pupils are equal, round, and reactive to light. Conjunctivae are normal.  Cardiovascular: Normal rate, regular rhythm, normal heart sounds and intact distal pulses.   Pulmonary/Chest: Effort normal and breath sounds normal.  Abdominal: Soft. Bowel sounds are normal. There is no tenderness.  Musculoskeletal:       Right shoulder: He exhibits decreased range of motion and pain.       Right hand: Decreased strength (4/5 motor strength with grip) noted.  Skin: Skin is warm and dry.  Nursing note and vitals reviewed.   Diabetic Foot Exam - Simple   Simple Foot Form Diabetic Foot exam was performed with the following findings:  Yes 07/04/2017  9:18 AM  Visual Inspection No deformities, no ulcerations, no other skin breakdown bilaterally:  Yes Sensation Testing Intact to touch and monofilament testing bilaterally:  Yes Pulse Check Posterior Tibialis and Dorsalis pulse intact bilaterally:  Yes Comments      Assessment & Plan:   Problem List Items Addressed This Visit      Endocrine   Type 2 diabetes mellitus without complication, without long-term current use of insulin (HCC)   Relevant Medications   metFORMIN (GLUCOPHAGE XR) 500 MG 24 hr tablet   glipiZIDE (GLUCOTROL) 10 MG tablet   losartan (COZAAR) 25 MG tablet   Other Relevant Orders   Glucose (CBG) (Completed)   HgB A1c (Completed)     Other   Hypercholesteremia   Relevant Medications   losartan (COZAAR) 25 MG tablet   sildenafil (VIAGRA)  25 MG tablet   Other Relevant Orders   Lipid Panel (Completed)    Other Visit Diagnoses    Chronic right shoulder pain    -  Primary   Relevant Medications   ibuprofen (ADVIL,MOTRIN) 600 MG tablet   Other Relevant Orders   Ambulatory referral to Orthopedics   Ambulatory referral to Physical Therapy   Erectile dysfunction, unspecified erectile dysfunction type       Relevant Medications   sildenafil (VIAGRA) 25 MG tablet   Other Relevant Orders   Testosterone,Free and Total (Completed)   Essential hypertension       Relevant Medications   losartan (COZAAR) 25 MG tablet   sildenafil (VIAGRA) 25 MG tablet   Ready to quit smoking       Relevant Medications   nicotine polacrilex (NICORETTE) 4 MG gum      Meds ordered this encounter  Medications  . metFORMIN (  GLUCOPHAGE XR) 500 MG 24 hr tablet    Sig: Take 2 tablets (1,000 mg total) by mouth 2 (two) times daily with a meal.    Dispense:  120 tablet    Refill:  3    Order Specific Question:   Supervising Provider    Answer:   Tresa Garter [id number]  . glipiZIDE (GLUCOTROL) 10 MG tablet    Sig: Take half a tablet by mouth daily before breakfast.    Dispense:  90 tablet    Refill:  3    Must have office visit for refills.    Order Specific Question:   Supervising Provider    Answer:   Tresa Garter [id number]  . ibuprofen (ADVIL,MOTRIN) 600 MG tablet    Sig: Take 1 tablet (600 mg total) by mouth every 6 (six) hours as needed for moderate pain or cramping (Take with food.).    Dispense:  30 tablet    Refill:  1    Order Specific Question:   Supervising Provider    Answer:   Tresa Garter [id number]  . losartan (COZAAR) 25 MG tablet    Sig: Take 1 tablet (25 mg total) by mouth daily.    Dispense:  30 tablet    Refill:  2    Order Specific Question:   Supervising Provider    Answer:   Tresa Garter [id number]  . sildenafil (VIAGRA) 25 MG tablet    Sig: Take 1-2 tablets (25-50 mg total) by mouth  daily as needed for erectile dysfunction.    Dispense:  10 tablet    Refill:  11    Order Specific Question:   Supervising Provider    Answer:   Tresa Garter [id number]  . nicotine polacrilex (NICORETTE) 4 MG gum    Sig: Take 1 each (4 mg total) by mouth as needed for smoking cessation.    Dispense:  100 tablet    Refill:  1    Order Specific Question:   Supervising Provider    Answer:   Tresa Garter [id number]    Follow-up: Return in about 3 months (around 10/04/2017), or if symptoms worsen or fail to improve, for DM .   Alfonse Spruce FNP

## 2017-07-04 NOTE — Progress Notes (Signed)
Patient is here for right shoulder pain radiated to the arm

## 2017-07-05 MED FILL — !VIAGRA 25 MG TABLET: 25 | 30 days supply | Qty: 3 | Fill #0

## 2017-07-06 LAB — LIPID PANEL
CHOLESTEROL TOTAL: 137 mg/dL (ref 100–199)
Chol/HDL Ratio: 3.1 ratio (ref 0.0–5.0)
HDL: 44 mg/dL (ref >39)
LDL CALC: 72 mg/dL (ref 0–99)
Triglycerides: 104 mg/dL (ref 0–149)
VLDL CHOLESTEROL CAL: 21 mg/dL (ref 5–40)

## 2017-07-06 LAB — TESTOSTERONE,FREE AND TOTAL
TESTOSTERONE FREE: 6.7 pg/mL — AB (ref 6.8–21.5)
TESTOSTERONE: 281 ng/dL (ref 264–916)

## 2017-07-09 ENCOUNTER — Encounter (HOSPITAL_COMMUNITY): Payer: Self-pay | Admitting: Emergency Medicine

## 2017-07-09 DIAGNOSIS — R61 Generalized hyperhidrosis: Secondary | ICD-10-CM | POA: Insufficient documentation

## 2017-07-09 DIAGNOSIS — H539 Unspecified visual disturbance: Secondary | ICD-10-CM | POA: Insufficient documentation

## 2017-07-09 DIAGNOSIS — F17220 Nicotine dependence, chewing tobacco, uncomplicated: Secondary | ICD-10-CM | POA: Insufficient documentation

## 2017-07-09 DIAGNOSIS — R110 Nausea: Secondary | ICD-10-CM | POA: Insufficient documentation

## 2017-07-09 DIAGNOSIS — F419 Anxiety disorder, unspecified: Secondary | ICD-10-CM | POA: Insufficient documentation

## 2017-07-09 DIAGNOSIS — R0789 Other chest pain: Secondary | ICD-10-CM | POA: Insufficient documentation

## 2017-07-09 DIAGNOSIS — F410 Panic disorder [episodic paroxysmal anxiety] without agoraphobia: Secondary | ICD-10-CM | POA: Insufficient documentation

## 2017-07-09 NOTE — ED Triage Notes (Signed)
Pt to ED via [facility] from the shelter with c/o anxiety attack.  Pt st's he has had them before.  Pt /co \\[place]isea , lightheadedness and sweating

## 2017-07-10 ENCOUNTER — Emergency Department (HOSPITAL_COMMUNITY)
Admission: EM | Admit: 2017-07-10 | Discharge: 2017-07-10 | Disposition: A | Discharge status: Home / Self Care | Payer: No Typology Code available for payment source | Attending: Emergency Medicine | Admitting: Emergency Medicine

## 2017-07-10 ENCOUNTER — Encounter: Payer: No Typology Code available for payment source | Admitting: Acute Care

## 2017-07-10 ENCOUNTER — Encounter: Payer: Self-pay | Admitting: Acute Care

## 2017-07-10 ENCOUNTER — Ambulatory Visit (INDEPENDENT_AMBULATORY_CARE_PROVIDER_SITE_OTHER): Payer: No Typology Code available for payment source | Admitting: Pulmonary Disease

## 2017-07-10 DIAGNOSIS — R002 Palpitations: Secondary | ICD-10-CM

## 2017-07-10 DIAGNOSIS — R0609 Other forms of dyspnea: Secondary | ICD-10-CM

## 2017-07-10 DIAGNOSIS — F410 Panic disorder [episodic paroxysmal anxiety] without agoraphobia: Secondary | ICD-10-CM

## 2017-07-10 LAB — PULMONARY FUNCTION TEST
DL/VA % pred: 112 %
DL/VA: 4.85 ml/min/mmHg/L
DLCO UNC: 28.06 ml/min/mmHg
DLCO cor % pred: 107 %
DLCO cor: 27.83 ml/min/mmHg
DLCO unc % pred: 108 %
FEF 25-75 POST: 2.67 L/s
FEF 25-75 Pre: 1.82 L/sec
FEF2575-%Change-Post: 47 %
FEF2575-%PRED-POST: 85 %
FEF2575-%Pred-Pre: 58 %
FEV1-%CHANGE-POST: 13 %
FEV1-%Pred-Post: 88 %
FEV1-%Pred-Pre: 77 %
FEV1-POST: 3 L
FEV1-Pre: 2.64 L
FEV1FVC-%CHANGE-POST: 5 %
FEV1FVC-%PRED-PRE: 90 %
FEV6-%Change-Post: 7 %
FEV6-%PRED-POST: 95 %
FEV6-%Pred-Pre: 89 %
FEV6-Post: 4 L
FEV6-Pre: 3.72 L
FEV6FVC-%CHANGE-POST: 0 %
FEV6FVC-%Pred-Post: 102 %
FEV6FVC-%Pred-Pre: 102 %
FVC-%Change-Post: 7 %
FVC-%PRED-POST: 93 %
FVC-%PRED-PRE: 86 %
FVC-PRE: 3.75 L
FVC-Post: 4.04 L
POST FEV1/FVC RATIO: 74 %
PRE FEV6/FVC RATIO: 99 %
Post FEV6/FVC ratio: 99 %
Pre FEV1/FVC ratio: 70 %

## 2017-07-10 MED ORDER — LORAZEPAM 1 MG PO TABS
1.0000 mg | ORAL_TABLET | Freq: Once | ORAL | Status: AC
  Administered 2017-07-10: 1 mg via ORAL

## 2017-07-10 MED ORDER — ASPIRIN 81 MG PO CHEW
CHEWABLE_TABLET | ORAL | Status: AC
  Filled 2017-07-10: qty 4

## 2017-07-10 MED ORDER — LORAZEPAM 1 MG PO TABS: 1.0000 mg | ORAL_TABLET | Freq: Three times a day (TID) | ORAL | 0 refills | Status: DC | PRN

## 2017-07-10 MED ORDER — LORAZEPAM 1 MG PO TABS
ORAL_TABLET | ORAL | Status: AC
  Filled 2017-07-10: qty 1

## 2017-07-10 MED ORDER — ASPIRIN 81 MG PO CHEW
324.0000 mg | CHEWABLE_TABLET | Freq: Once | ORAL | Status: AC
  Administered 2017-07-10: 324 mg via ORAL

## 2017-07-10 NOTE — Progress Notes (Signed)
PFT done today. 

## 2017-07-10 NOTE — ED Notes (Signed)
Patient states he does not want blood drawn. He endorses hx anxiety - this episode starting at 9:30pm, pt reports he has stressors in his life, but the anxiety came out of nowhere today.

## 2017-07-10 NOTE — Discharge Instructions (Signed)
You would not allow blood to be drawn. Without that, I cannot be certain that there was not some heart damage that occurred when your heart was racing. If it happens again, return to the emergency department. Also, consider discussing with your primary care provider whether you should have an outpatient heart monitor to see if you are having any abnormalities in your heart rhythm.

## 2017-07-10 NOTE — ED Provider Notes (Signed)
[facility] DEPT Provider Note   CSN: [id number] Arrival date & time: 07/09/17  2251  By signing my name below, I, Adrian Schultz, attest that this documentation has been prepared under the direction and in the presence of Delora Fuel, MD. Electronically Signed: Theresia Schultz, ED Scribe. 07/10/17. 1:35 AM.  History   Chief Complaint Chief Complaint  Patient presents with  . Anxiety   The history is provided by the patient. No language interpreter was used.   HPI Comments: Adrian Schultz is a 51 y.o. male with a PMHx, DM, HTN and HLD, who presents to the Emergency Department to be evaluated for an episode of anxiety onset 3 hours ago. Pt has a hx of panic/anxiety attacks previously but not to this extent. No known cause or trigger for this episode. Pt reports associated increased palpitations, nausea, blurred vision, diaphoresis and chest pressure. Pt notes that he feels almost normal now, there is some lingering tenseness in his neck and fatigue. Pt endorses alcohol use but not in the past 2 days. No tobacco use. Pt has a FHx of CAD. No other complaints at this time.  Past Medical History:  Diagnosis Date  . Diabetes mellitus without complication ([place])   . ETOH abuse   . Hypercholesteremia   . Hypertension   . Suicidal ideation     Patient Active Problem List   Diagnosis Date Noted  . Hypercholesteremia 07/04/2017  . Bronchiectasis ([place]) 06/17/2017  . Type 2 diabetes mellitus without complication, without long-term current use of insulin ([place]) 04/05/2017  . Intoxication 07/09/2013    Past Surgical History:  Procedure Laterality Date  . FOOT SURGERY    . KNEE SURGERY         Home Medications    Prior to Admission medications   Medication Sig Start Date End Date Taking? Authorizing Provider  acetaminophen (TYLENOL) 500 MG tablet Take 2 tablets (1,000 mg total) by mouth every 8 (eight) hours as needed for moderate pain. 06/14/17   Alfonse Spruce, FNP    albuterol (PROVENTIL HFA;VENTOLIN HFA) 108 (90 Base) MCG/ACT inhaler Inhale 2 puffs into the lungs every 6 (six) hours as needed for wheezing or shortness of breath. 05/16/17   Alfonse Spruce, FNP  Blood Glucose Monitoring Suppl (TRUE METRIX METER) w/Device KIT 1 each by Does not apply route daily. 04/26/16   Maren Reamer, MD  glipiZIDE (GLUCOTROL) 10 MG tablet Take half a tablet by mouth daily before breakfast. 07/04/17   Alfonse Spruce, FNP  glucose blood (TRUE METRIX BLOOD GLUCOSE TEST) test strip Use as instructed 05/10/17   Alfonse Spruce, FNP  ibuprofen (ADVIL,MOTRIN) 600 MG tablet Take 1 tablet (600 mg total) by mouth every 6 (six) hours as needed for moderate pain or cramping (Take with food.). 07/04/17   Alfonse Spruce, FNP  losartan (COZAAR) 25 MG tablet Take 1 tablet (25 mg total) by mouth daily. 07/04/17   Alfonse Spruce, FNP  meloxicam (MOBIC) 7.5 MG tablet Take 1 tablet (7.5 mg total) by mouth daily. 06/14/17   Alfonse Spruce, FNP  metFORMIN (GLUCOPHAGE XR) 500 MG 24 hr tablet Take 2 tablets (1,000 mg total) by mouth 2 (two) times daily with a meal. 07/04/17   Hairston, Toy Baker R, FNP  nicotine polacrilex (NICORETTE) 4 MG gum Take 1 each (4 mg total) by mouth as needed for smoking cessation. 07/04/17   Alfonse Spruce, FNP  pravastatin (PRAVACHOL) 20 MG tablet Take 1 tablet (20 mg total) by  mouth daily. 04/15/17   Alfonse Spruce, FNP  sildenafil (VIAGRA) 25 MG tablet Take 1-2 tablets (25-50 mg total) by mouth daily as needed for erectile dysfunction. 07/04/17   Alfonse Spruce, FNP  traMADol (ULTRAM) 50 MG tablet TAKE ONE TABLET BY MOUTH EVERY 8 HOURS AS NEEDED FOR SEVERE PAIN 06/17/17   Alfonse Spruce, FNP  TRUEPLUS LANCETS 28G MISC 1 each by Does not apply route daily. 05/10/17   Alfonse Spruce, FNP    Family History No family history on file.  Social History Social History  Substance Use Topics  . Smoking status: Never Smoker   . Smokeless tobacco: Current User    Types: Snuff     Comment: daily since 51 years old  . Alcohol use 0.0 oz/week     Comment: beer - daily 1/2 a case      Allergies   Lisinopril   Review of Systems Review of Systems  Constitutional: Positive for diaphoresis.  Eyes: Positive for visual disturbance.  Respiratory: Positive for chest tightness.   Cardiovascular: Positive for palpitations.  Gastrointestinal: Positive for nausea.  Psychiatric/Behavioral: The patient is nervous/anxious.   All other systems reviewed and are negative.    Physical Exam Updated Vital Signs BP (!) 156/88   Pulse (!) 103   Temp 98.8 F (37.1 C) (Oral)   Resp 18   Ht _0  (1.676 m)   Wt 180 lb (81.6 kg)   SpO2 98%   BMI 29.05 kg/m   Physical Exam  Constitutional: He is oriented to person, place, and time. He appears well-developed and well-nourished.  HENT:  Head: Normocephalic and atraumatic.  Eyes: Pupils are equal, round, and reactive to light. EOM are normal.  Neck: Normal range of motion. Neck supple. No JVD present.  Cardiovascular: Normal rate, regular rhythm and normal heart sounds.   No murmur heard. Pulmonary/Chest: Effort normal and breath sounds normal. He has no wheezes. He has no rales. He exhibits no tenderness.  Abdominal: Soft. Bowel sounds are normal. He exhibits no distension and no mass. There is no tenderness.  Musculoskeletal: Normal range of motion. He exhibits no edema.  Lymphadenopathy:    He has no cervical adenopathy.  Neurological: He is alert and oriented to person, place, and time. No cranial nerve deficit. He exhibits normal muscle tone. Coordination normal.  Skin: Skin is warm and dry. No rash noted.  Psychiatric: He has a normal mood and affect. His behavior is normal. Judgment and thought content normal.  Nursing note and vitals reviewed.    ED Treatments / Results  DIAGNOSTIC STUDIES: Oxygen Saturation is 98% on RA, normal by my interpretation.    COORDINATION OF CARE: 1:32 AM-Discussed next steps with pt including an EKG and checking heart enzymes. Pt verbalized understanding and is agreeable with the plan.   Labs (all labs ordered are listed, but only abnormal results are displayed) Labs Reviewed  BASIC METABOLIC PANEL  CBC WITH DIFFERENTIAL/PLATELET  I-STAT TROPONIN, ED    EKG  EKG Interpretation None      Procedures Procedures (including critical care time)  Medications Ordered in ED Medications  LORazepam (ATIVAN) tablet 1 mg (not administered)  aspirin chewable tablet 324 mg (not administered)     Initial Impression / Assessment and Plan / ED Course  I have reviewed the triage vital signs and the nursing notes.  Pertinent lab esults that were available during my care of the patient were reviewed by me and considered in my medical  decision making (see chart for details).  Episode of anxiety with associated palpitations. Some chest discomfort occurred during episode of anxiety of uncertain significance. ECG shows no acute changes. Screening labs including troponin were ordered. Old records are reviewed, and he has no relevant past visits. Unfortunately, patient is refusing to have labs drawn pretty was given a dose of lorazepam, and is resting comfortably. He remains symptom free. Because of inability to check labs, he is discharged and advised to return should symptoms worsen. May need to consider outpatient Holter monitor or event monitor. He is also given a prescription for a small number of lorazepam tablets.  Final Clinical Impressions(s) / ED Diagnoses   Final diagnoses:  Panic attack  Palpitations    New Prescriptions New Prescriptions   LORAZEPAM (ATIVAN) 1 MG TABLET    Take 1 tablet (1 mg total) by mouth 3 (three) times daily as needed for anxiety.   I personally performed the services described in this documentation, which was scribed in my presence. The recorded information has been reviewed and  is accurate.       Delora Fuel, MD 69/43/70 [phone]

## 2017-07-15 ENCOUNTER — Ambulatory Visit: Payer: Self-pay | Attending: Family Medicine | Admitting: Family Medicine

## 2017-07-15 ENCOUNTER — Other Ambulatory Visit: Payer: Self-pay | Admitting: Internal Medicine

## 2017-07-15 ENCOUNTER — Encounter: Payer: Self-pay | Admitting: Family Medicine

## 2017-07-15 ENCOUNTER — Ambulatory Visit: Payer: Self-pay | Admitting: Licensed Clinical Social Worker

## 2017-07-15 VITALS — BP 113/68 | HR 91 | Temp 98.1°F | Resp 18 | Ht 68.0 in | Wt 190.2 lb

## 2017-07-15 DIAGNOSIS — E119 Type 2 diabetes mellitus without complications: Secondary | ICD-10-CM | POA: Insufficient documentation

## 2017-07-15 DIAGNOSIS — M25511 Pain in right shoulder: Secondary | ICD-10-CM | POA: Insufficient documentation

## 2017-07-15 DIAGNOSIS — F419 Anxiety disorder, unspecified: Secondary | ICD-10-CM

## 2017-07-15 DIAGNOSIS — M75101 Unspecified rotator cuff tear or rupture of right shoulder, not specified as traumatic: Secondary | ICD-10-CM

## 2017-07-15 DIAGNOSIS — G8929 Other chronic pain: Secondary | ICD-10-CM | POA: Insufficient documentation

## 2017-07-15 DIAGNOSIS — F1010 Alcohol abuse, uncomplicated: Secondary | ICD-10-CM

## 2017-07-15 DIAGNOSIS — F418 Other specified anxiety disorders: Secondary | ICD-10-CM

## 2017-07-15 DIAGNOSIS — Z79899 Other long term (current) drug therapy: Secondary | ICD-10-CM | POA: Insufficient documentation

## 2017-07-15 DIAGNOSIS — Z7984 Long term (current) use of oral hypoglycemic drugs: Secondary | ICD-10-CM | POA: Insufficient documentation

## 2017-07-15 DIAGNOSIS — Z8659 Personal history of other mental and behavioral disorders: Secondary | ICD-10-CM

## 2017-07-15 DIAGNOSIS — F410 Panic disorder [episodic paroxysmal anxiety] without agoraphobia: Secondary | ICD-10-CM | POA: Insufficient documentation

## 2017-07-15 LAB — GLUCOSE, POCT (MANUAL RESULT ENTRY): POC GLUCOSE: 145 mg/dL — AB (ref 70–99)

## 2017-07-15 MED ORDER — ESCITALOPRAM OXALATE 10 MG PO TABS: 10.0000 mg | ORAL_TABLET | Freq: Every day | ORAL | 2 refills | Status: DC

## 2017-07-15 MED ORDER — TRAMADOL HCL 50 MG PO TABS: ORAL_TABLET | ORAL | 0 refills | Status: DC

## 2017-07-15 MED FILL — ESCITALOPRAM 10 MG TABLET: 10 | 30 days supply | Qty: 30 | Fill #0

## 2017-07-15 MED FILL — TRUE METRIX TEST STRIP: 30 days supply | Qty: 100 | Fill #3

## 2017-07-15 MED FILL — MELOXICAM 7.5 MG TABLET: 7.5 | 30 days supply | Qty: 30 | Fill #1

## 2017-07-15 NOTE — Progress Notes (Signed)
Patient is here for hospital f/up for anxiety attacks

## 2017-07-15 NOTE — BH Specialist Note (Signed)
Integrated Behavioral Health Initial Visit  MRN: [id number] Name: Adrian Schultz   Session Start time: 4:30 PM Session End time: 5:00 PM Total time: 30 minutes  Type of Service: Integrated Behavioral Health- Individual/Family Interpretor:No. Interpretor Name and Language: N/A   Warm Hand Off Completed.       SUBJECTIVE: Adrian Schultz is a 51 y.o. male accompanied by patient. Patient was referred by FNP Hairston for anxiety and depression. Patient reports the following symptoms/concerns: feelings of sadness and worry, difficulty sleeping, low energy, difficulty concentrating, racing thoughts, panic attacks, and irritability Duration of problem: Ongoing; Severity of problem: moderate  OBJECTIVE: Mood: Anxious and Affect: Tearful Risk of harm to self or others: No plan to harm self or others   LIFE CONTEXT: Family and Social: Pt resides in a shelter through [employer]. He has close relationships with community agency staff and volunteers with [employer] School/Work: Pt has applied for disability Self-Care: Pt reports substance use (alcohol) and tobacco (dip) He is open to participating in substance use treatment Life Changes: Pt is experiencing increased panic attacks. He is using substances and is ready to decrease usage.  GOALS ADDRESSED: Patient will reduce symptoms of: anxiety and depression and increase knowledge and/or ability of: coping skills and also: Increase adequate support systems for patient/family and Decrease self-medicating behaviors   INTERVENTIONS: Solution-Focused Strategies, Supportive Counseling, Psychoeducation and/or Health Education and Link to [facility]  Standardized Assessments completed: GAD-7 and PHQ 2&9  ASSESSMENT: Patient currently experiencing depression and anxiety triggered by ongoing substance use. He reports feelings of sadness and worry, difficulty sleeping, low energy, difficulty concentrating, racing thoughts, panic  attacks, and irritability. Patient receives strong support and receives psychotherapy with Annia Friendly at [facility]. Patient may benefit from psychoeducation, psychotherapy, and medication management. LCSWA educated pt on how substance use can negatively impact one's physical and mental health. LCSWA discussed benefits of applying healthy coping skills to decrease symptoms. Pt is open to participating in AA to address alcohol use. He is not open to medication management. LCSWA provided pt with community resources for crisis intervention, substance use treatment, food insecurity, and veteran services through the [facility].  PLAN: 1. Follow up with behavioral health clinician on : Pt was encouraged to contact LCSWA if symptoms worsen or fail to improve to schedule behavioral appointments at [facility]. 2. Behavioral recommendations: LCSWA recommends that pt apply healthy coping skills discussed, re-initiate psychotherapy through [facility], and utilize provided resources. Pt is encouraged to schedule follow up appointment with LCSWA 3. Referral(s): Paramedic (LME/Outside Clinic), Substance Abuse Program and Community Resources:  Food 4. "From scale of 1-10, how likely are you to follow plan?": 7/10  Bridgett Larsson, LCSW 07/18/17 10:45 AM

## 2017-07-15 NOTE — Progress Notes (Signed)
Subjective:  Patient ID: Adrian Schultz, male    DOB: [birth year]  Age: 51 y.o. MRN: [id number]  CC: Follow-up  HPI [facility] presents for chronic right shoulder pain which has been present for 2 months. Pain is located in the right shoulder(s), is described as aching, and is moderate. Associated symptoms include: decreased ROM, burning pain, and right hand strength. Reports taking ibuprofen and tramadol for relief of symptoms. Previous work up included: xray. Related to injury: no. He is requesting refill of Tramadol. Recent history of  anxiety disorder with ED visit for panic attack.  He has the following symptoms: difficulty concentrating, feelings of losing control, palpitations, racing thoughts, vomiting. Onset of symptoms was approximately patient reports since his mid 94's He denies current suicidal and homicidal ideation. Possible organic causes contributing are: history of ETOC abuse. Risk factors: previous episode of depression and homelesssness Previous treatment includes Ativan and none.  He complains of the following side effects from the treatment: none.   Outpatient Medications Prior to Visit  Medication Sig Dispense Refill  . acetaminophen (TYLENOL) 500 MG tablet Take 2 tablets (1,000 mg total) by mouth every 8 (eight) hours as needed for moderate pain. 30 tablet 1  . albuterol (PROVENTIL HFA;VENTOLIN HFA) 108 (90 Base) MCG/ACT inhaler Inhale 2 puffs into the lungs every 6 (six) hours as needed for wheezing or shortness of breath. 1 Inhaler 1  . Blood Glucose Monitoring Suppl (TRUE METRIX METER) w/Device KIT 1 each by Does not apply route daily. 1 kit 0  . glipiZIDE (GLUCOTROL) 10 MG tablet Take half a tablet by mouth daily before breakfast. 90 tablet 3  . glucose blood (TRUE METRIX BLOOD GLUCOSE TEST) test strip Use as instructed 100 each 12  . ibuprofen (ADVIL,MOTRIN) 600 MG tablet Take 1 tablet (600 mg total) by mouth every 6 (six) hours as needed for moderate pain or cramping  (Take with food.). 30 tablet 1  . LORazepam (ATIVAN) 1 MG tablet Take 1 tablet (1 mg total) by mouth 3 (three) times daily as needed for anxiety. 6 tablet 0  . losartan (COZAAR) 25 MG tablet Take 1 tablet (25 mg total) by mouth daily. 30 tablet 2  . meloxicam (MOBIC) 7.5 MG tablet Take 1 tablet (7.5 mg total) by mouth daily. 30 tablet 1  . metFORMIN (GLUCOPHAGE XR) 500 MG 24 hr tablet Take 2 tablets (1,000 mg total) by mouth 2 (two) times daily with a meal. 120 tablet 3  . nicotine polacrilex (NICORETTE) 4 MG gum Take 1 each (4 mg total) by mouth as needed for smoking cessation. 100 tablet 1  . pravastatin (PRAVACHOL) 20 MG tablet Take 1 tablet (20 mg total) by mouth daily. 90 tablet 0  . sildenafil (VIAGRA) 25 MG tablet Take 1-2 tablets (25-50 mg total) by mouth daily as needed for erectile dysfunction. 10 tablet 11  . TRUEPLUS LANCETS 28G MISC 1 each by Does not apply route daily. 100 each 11  . traMADol (ULTRAM) 50 MG tablet TAKE ONE TABLET BY MOUTH EVERY 8 HOURS AS NEEDED FOR SEVERE PAIN 30 tablet 0   Facility-Administered Medications Prior to Visit  Medication Dose Route Frequency Provider Last Rate Last Dose  . ipratropium-albuterol (DUONEB) 0.5-2.5 (3) MG/3ML nebulizer solution 3 mL  3 mL Nebulization Q20 Min PRN Matix Henshaw R, FNP   3 mL at 05/16/17 1500    ROS Review of Systems  Constitutional: Negative.   Eyes: Negative.   Respiratory: Negative.   Cardiovascular: Negative.  Gastrointestinal: Negative.   Musculoskeletal: Positive for arthralgias.  Skin: Negative.   Neurological: Negative.   Psychiatric/Behavioral: The patient is nervous/anxious.    Objective:  BP 113/68 (BP Location: Left Arm, Patient Position: Sitting, Cuff Size: Normal)   Pulse 91   Temp 98.1 F (36.7 C) (Oral)   Resp 18   Ht _0  (1.727 m)   Wt 190 lb 3.2 oz (86.3 kg)   SpO2 97%   BMI 28.92 kg/m   BP/Weight 07/15/2017 07/10/2017 7/71/1657  Systolic BP 903 833 383  Diastolic BP 68 72 74    Wt. (Lbs) 190.2 188 -  BMI 28.92 31.05 29.05     Physical Exam  Constitutional: He appears well-developed and well-nourished.  Cardiovascular: Normal rate, regular rhythm, normal heart sounds and intact distal pulses.   Pulmonary/Chest: Effort normal and breath sounds normal.  Abdominal: Soft. Bowel sounds are normal.  Musculoskeletal:       Right shoulder: He exhibits pain.  Skin: Skin is warm and dry.  Psychiatric: His mood appears anxious. He expresses no homicidal and no suicidal ideation. He expresses no suicidal plans and no homicidal plans.  Nursing note and vitals reviewed.   Assessment & Plan:   Problem List Items Addressed This Visit      Endocrine   Type 2 diabetes mellitus without complication, without long-term current use of insulin ([place])   Relevant Orders   Glucose (CBG) (Completed)    Other Visit Diagnoses    Chronic right shoulder pain    -  Primary   Relevant Medications   escitalopram (LEXAPRO) 10 MG tablet   traMADol (ULTRAM) 50 MG tablet   Other Relevant Orders   Drug Screen, Urine   Anxiety       LCSW spoke with patient and provided resources.   Relevant Medications   escitalopram (LEXAPRO) 10 MG tablet   Other Relevant Orders   Ambulatory referral to Psychiatry   History of panic attacks       Relevant Medications   escitalopram (LEXAPRO) 10 MG tablet   Other Relevant Orders   Ambulatory referral to Psychiatry      Meds ordered this encounter  Medications  . escitalopram (LEXAPRO) 10 MG tablet    Sig: Take 1 tablet (10 mg total) by mouth daily.    Dispense:  30 tablet    Refill:  2    Order Specific Question:   Supervising Provider    Answer:   Tresa Garter [id number]  . traMADol (ULTRAM) 50 MG tablet    Sig: TAKE ONE TABLET BY MOUTH EVERY 8 HOURS AS NEEDED FOR SEVERE PAIN. USE SPARINGLY.    Dispense:  30 tablet    Refill:  0    Order Specific Question:   Supervising Provider    Answer:   Tresa Garter [id number]     Follow-up: Return in about 8 weeks (around 09/09/2017), or if symptoms worsen or fail to improve, for Anxiety.   Alfonse Spruce FNP

## 2017-07-15 NOTE — Patient Instructions (Addendum)
Follow up with previous orthopedic and physical therapy referrals.  Apply for orange card if you have not already done so.    Panic Attacks Panic attacks are sudden, short feelings of great fear or discomfort. You may have them for no reason when you are relaxed, when you are uneasy (anxious), or when you are sleeping. Follow these instructions at home:  Take all your medicines as told.  Check with your doctor before starting new medicines.  Keep all doctor visits. Contact a doctor if:  You are not able to take your medicines as told.  Your symptoms do not get better.  Your symptoms get worse. Get help right away if:  Your attacks seem different than your normal attacks.  You have thoughts about hurting yourself or others.  You take panic attack medicine and you have a side effect. This information is not intended to replace advice given to you by your health care provider. Make sure you discuss any questions you have with your health care provider. Document Released: 12/15/2010 Document Revised: 04/19/2016 Document Reviewed: 06/26/2013 Elsevier Interactive Patient Education  2017 Elsevier Inc[employer]   Joint Pain Joint pain can be caused by many things. The joint can be bruised, infected, weak from aging, or sore from exercise. The pain will probably go away if you follow your doctor's instructions for home care. If your joint pain continues, more tests may be needed to help find the cause of your condition. Follow these instructions at home: Watch your condition for any changes. Follow these instructions as told to lessen the pain that you are feeling:  Take medicines only as told by your doctor.  Rest the sore joint for as long as told by your doctor. If your doctor tells you to, raise (elevate) the painful joint above the level of your heart while you are sitting or lying down.  Do not do things that cause pain or make the pain worse.  If told, put ice on the painful  area: ? Put ice in a plastic bag. ? Place a towel between your skin and the bag. ? Leave the ice on for 20 minutes, 2-3 times per day.  Wear an elastic bandage, splint, or sling as told by your doctor. Loosen the bandage or splint if your fingers or toes lose feeling (become numb) and tingle, or if they turn cold and blue.  Begin exercising or stretching the joint as told by your doctor. Ask your doctor what types of exercise are safe for you.  Keep all follow-up visits as told by your doctor. This is important.  Contact a doctor if:  Your pain gets worse and medicine does not help it.  Your joint pain does not get better in 3 days.  You have more bruising or swelling.  You have a fever.  You lose 10 pounds (4.5 kg) or more without trying. Get help right away if:  You are not able to move the joint.  Your fingers or toes become numb or they turn cold and blue. This information is not intended to replace advice given to you by your health care provider. Make sure you discuss any questions you have with your health care provider. Document Released: 10/31/2009 Document Revised: 04/19/2016 Document Reviewed: 08/24/2014 Elsevier Interactive Patient Education  [employer].

## 2017-07-16 MED FILL — traMADol HCL 50 MG TABS: 50 | 10 days supply | Qty: 30 | Fill #0

## 2017-07-17 ENCOUNTER — Other Ambulatory Visit: Payer: Self-pay | Admitting: Family Medicine

## 2017-07-17 ENCOUNTER — Telehealth: Payer: Self-pay

## 2017-07-17 DIAGNOSIS — E785 Hyperlipidemia, unspecified: Principal | ICD-10-CM

## 2017-07-17 DIAGNOSIS — E1169 Type 2 diabetes mellitus with other specified complication: Secondary | ICD-10-CM

## 2017-07-17 MED ORDER — PRAVASTATIN SODIUM 20 MG PO TABS: 20.0000 mg | ORAL_TABLET | Freq: Every day | ORAL | 3 refills | Status: DC

## 2017-07-17 NOTE — Telephone Encounter (Signed)
-----   Message from Lizbeth Bark, FNP sent at 07/17/2017  1:30 PM EDT ----- Cholesterol levels normal on current dosage of pravastatin. Testosterone levels are only slightly decreased. Recommend continuing Viagra for now, follow up if symptoms fail to improve.

## 2017-07-17 NOTE — Telephone Encounter (Signed)
CMA call regarding lab results   Patient verify DOB  Patient was aware and understood  

## 2017-07-18 ENCOUNTER — Inpatient Hospital Stay: Payer: No Typology Code available for payment source | Admitting: Family Medicine

## 2017-08-02 ENCOUNTER — Ambulatory Visit: Payer: No Typology Code available for payment source | Admitting: Adult Health

## 2017-08-07 ENCOUNTER — Other Ambulatory Visit: Payer: Self-pay | Admitting: Family Medicine

## 2017-08-07 ENCOUNTER — Telehealth: Payer: Self-pay | Admitting: Family Medicine

## 2017-08-07 DIAGNOSIS — E119 Type 2 diabetes mellitus without complications: Secondary | ICD-10-CM

## 2017-08-07 DIAGNOSIS — R739 Hyperglycemia, unspecified: Secondary | ICD-10-CM

## 2017-08-07 NOTE — Telephone Encounter (Signed)
Pt called requesting medication refill on LORazepam (ATIVAN) 1 MG tablet ,meloxicam (MOBIC) 7.5 MG tablet. Please f/up

## 2017-08-07 NOTE — Telephone Encounter (Signed)
Pt called requesting medication refill on traMADol (ULTRAM) 50 MG tablet °

## 2017-08-08 ENCOUNTER — Other Ambulatory Visit: Payer: Self-pay | Admitting: Family Medicine

## 2017-08-08 DIAGNOSIS — G8929 Other chronic pain: Secondary | ICD-10-CM

## 2017-08-08 DIAGNOSIS — M25511 Pain in right shoulder: Principal | ICD-10-CM

## 2017-08-08 MED ORDER — MELOXICAM 7.5 MG PO TABS: 7.5000 mg | ORAL_TABLET | Freq: Every day | ORAL | 1 refills | Status: DC

## 2017-08-08 MED FILL — MELOXICAM 7.5 MG TABLET: 7.5 | 30 days supply | Qty: 30 | Fill #0

## 2017-08-08 NOTE — Telephone Encounter (Signed)
Meloxicam refill will be sent. Lorazepam was not prescribed by provider in office and therefore will not be refilled, I do not prescribe benzodiazepines. If he is still experiencing anxiety symptoms can increase Celexa dosage and/or refer to psychiatry,  or he can schedule follow up appointment to address. No further refills for tramadol without urine screen. This seem to more of a chronic pain issue , I will refer to pain management and PT for further follow up.

## 2017-08-08 NOTE — Telephone Encounter (Signed)
CMA call regarding his medication   Patient did not answer but left a message with his emergency contact info

## 2017-08-12 NOTE — Telephone Encounter (Signed)
No additional refills without UDS

## 2017-08-14 ENCOUNTER — Other Ambulatory Visit: Payer: Self-pay | Admitting: Family Medicine

## 2017-08-14 DIAGNOSIS — G8929 Other chronic pain: Secondary | ICD-10-CM

## 2017-08-14 DIAGNOSIS — M25511 Pain in right shoulder: Principal | ICD-10-CM

## 2017-08-16 NOTE — Telephone Encounter (Signed)
CMA call regarding medication issue   Patient did not answer but left a VM stating the reason of the call & to call me back

## 2017-08-20 NOTE — Telephone Encounter (Signed)
CMA call regarding medication issues   Patient did not answer but left a VM stating the reason of the call & to call back

## 2017-08-21 ENCOUNTER — Other Ambulatory Visit: Payer: Self-pay | Admitting: Family Medicine

## 2017-08-21 DIAGNOSIS — G8929 Other chronic pain: Secondary | ICD-10-CM

## 2017-08-21 DIAGNOSIS — M25511 Pain in right shoulder: Principal | ICD-10-CM

## 2017-08-29 MED ORDER — GLUCOSE BLOOD VI STRP: ORAL_STRIP | 12 refills | Status: DC

## 2017-08-29 NOTE — Telephone Encounter (Signed)
Patient called requesting a medication refill on strips for meter, Ativan, and Tramadol. Patient has an appointment on Nov 9th please fu

## 2017-08-29 NOTE — Telephone Encounter (Signed)
Test strips refilled. Per Mandesia's last note, she will not prescribe Ativan and patient needs to come in for urine test before tramadol will be refilled. There is an active order for that. Our office has tried to contact him multiple times concerning this.

## 2017-08-30 NOTE — Telephone Encounter (Signed)
CMA call regarding medication refill is been sent to pharmacy  CMA spoke with emergency contact  She was aware and understood  CMA transfer to scheduler so she can make a f/up appt

## 2017-09-05 ENCOUNTER — Encounter (HOSPITAL_COMMUNITY): Payer: Self-pay | Admitting: Emergency Medicine

## 2017-09-05 ENCOUNTER — Emergency Department (HOSPITAL_COMMUNITY)
Admission: EM | Admit: 2017-09-05 | Discharge: 2017-09-06 | Disposition: A | Discharge status: Home / Self Care | Payer: No Typology Code available for payment source | Attending: Emergency Medicine | Admitting: Emergency Medicine

## 2017-09-05 DIAGNOSIS — F10220 Alcohol dependence with intoxication, uncomplicated: Secondary | ICD-10-CM | POA: Insufficient documentation

## 2017-09-05 DIAGNOSIS — F1014 Alcohol abuse with alcohol-induced mood disorder: Secondary | ICD-10-CM | POA: Diagnosis present

## 2017-09-05 DIAGNOSIS — Z7984 Long term (current) use of oral hypoglycemic drugs: Secondary | ICD-10-CM | POA: Insufficient documentation

## 2017-09-05 DIAGNOSIS — I10 Essential (primary) hypertension: Secondary | ICD-10-CM | POA: Insufficient documentation

## 2017-09-05 DIAGNOSIS — F10920 Alcohol use, unspecified with intoxication, uncomplicated: Secondary | ICD-10-CM

## 2017-09-05 DIAGNOSIS — Z046 Encounter for general psychiatric examination, requested by authority: Secondary | ICD-10-CM

## 2017-09-05 DIAGNOSIS — Z8659 Personal history of other mental and behavioral disorders: Secondary | ICD-10-CM

## 2017-09-05 DIAGNOSIS — R45851 Suicidal ideations: Secondary | ICD-10-CM | POA: Insufficient documentation

## 2017-09-05 DIAGNOSIS — Z79899 Other long term (current) drug therapy: Secondary | ICD-10-CM | POA: Insufficient documentation

## 2017-09-05 DIAGNOSIS — Y908 Blood alcohol level of 240 mg/100 ml or more: Secondary | ICD-10-CM | POA: Insufficient documentation

## 2017-09-05 DIAGNOSIS — E119 Type 2 diabetes mellitus without complications: Secondary | ICD-10-CM | POA: Insufficient documentation

## 2017-09-05 DIAGNOSIS — Z008 Encounter for other general examination: Secondary | ICD-10-CM

## 2017-09-05 DIAGNOSIS — M75101 Unspecified rotator cuff tear or rupture of right shoulder, not specified as traumatic: Secondary | ICD-10-CM

## 2017-09-05 LAB — CBC WITH DIFFERENTIAL/PLATELET
BASOS PCT: 0 %
Basophils Absolute: 0 10*3/uL (ref 0.0–0.1)
EOS ABS: 0.4 10*3/uL (ref 0.0–0.7)
EOS PCT: 4 %
HCT: 43.7 % (ref 39.0–52.0)
HEMOGLOBIN: 15.2 g/dL (ref 13.0–17.0)
Lymphocytes Relative: 38 %
Lymphs Abs: 3.8 10*3/uL (ref 0.7–4.0)
MCH: 31.9 pg (ref 26.0–34.0)
MCHC: 34.8 g/dL (ref 30.0–36.0)
MCV: 91.6 fL (ref 78.0–100.0)
MONOS PCT: 4 %
Monocytes Absolute: 0.4 10*3/uL (ref 0.1–1.0)
NEUTROS PCT: 54 %
Neutro Abs: 5.4 10*3/uL (ref 1.7–7.7)
PLATELETS: 194 10*3/uL (ref 150–400)
RBC: 4.77 MIL/uL (ref 4.22–5.81)
RDW: 13.8 % (ref 11.5–15.5)
WBC: 10 10*3/uL (ref 4.0–10.5)

## 2017-09-05 LAB — ETHANOL: Alcohol, Ethyl (B): 303 mg/dL (ref <10)

## 2017-09-05 LAB — COMPREHENSIVE METABOLIC PANEL
ALBUMIN: 4 g/dL (ref 3.5–5.0)
ALT: 25 U/L (ref 17–63)
AST: 23 U/L (ref 15–41)
Alkaline Phosphatase: 104 U/L (ref 38–126)
Anion gap: 11 (ref 5–15)
BUN: 9 mg/dL (ref 6–20)
CHLORIDE: 99 mmol/L — AB (ref 101–111)
CO2: 26 mmol/L (ref 22–32)
CREATININE: 0.69 mg/dL (ref 0.61–1.24)
Calcium: 8.8 mg/dL — ABNORMAL LOW (ref 8.9–10.3)
GFR calc Af Amer: >60 mL/min (ref >60)
GFR calc non Af Amer: >60 mL/min (ref >60)
Glucose, Bld: 199 mg/dL — ABNORMAL HIGH (ref 65–99)
Potassium: 3.6 mmol/L (ref 3.5–5.1)
SODIUM: 136 mmol/L (ref 135–145)
Total Bilirubin: 1.1 mg/dL (ref 0.3–1.2)
Total Protein: 8.6 g/dL — ABNORMAL HIGH (ref 6.5–8.1)

## 2017-09-05 LAB — RAPID URINE DRUG SCREEN, HOSP PERFORMED
AMPHETAMINES: NOT DETECTED
Barbiturates: NOT DETECTED
Benzodiazepines: NOT DETECTED
Cocaine: NOT DETECTED
Opiates: NOT DETECTED
TETRAHYDROCANNABINOL: NOT DETECTED

## 2017-09-05 LAB — ACETAMINOPHEN LEVEL

## 2017-09-05 LAB — SALICYLATE LEVEL

## 2017-09-05 MED ORDER — METFORMIN HCL ER 500 MG PO TB24
1000.0000 mg | ORAL_TABLET | Freq: Two times a day (BID) | ORAL | Status: DC
  Administered 2017-09-06: 1000 mg via ORAL
  Filled 2017-09-05 (×2): qty 2

## 2017-09-05 MED ORDER — GLIPIZIDE 5 MG PO TABS
5.0000 mg | ORAL_TABLET | Freq: Every day | ORAL | Status: DC
  Filled 2017-09-05: qty 1

## 2017-09-05 MED ORDER — ESCITALOPRAM OXALATE 10 MG PO TABS: 10.0000 mg | ORAL_TABLET | Freq: Every day | ORAL | Status: DC

## 2017-09-05 MED ORDER — MELOXICAM 7.5 MG PO TABS
7.5000 mg | ORAL_TABLET | Freq: Every day | ORAL | Status: DC
  Administered 2017-09-05 – 2017-09-06 (×2): 7.5 mg via ORAL
  Filled 2017-09-05 (×2): qty 1

## 2017-09-05 MED ORDER — IPRATROPIUM-ALBUTEROL 0.5-2.5 (3) MG/3ML IN SOLN: 3.0000 mL | RESPIRATORY_TRACT | Status: DC | PRN

## 2017-09-05 MED ORDER — ACETAMINOPHEN 500 MG PO TABS: 1000.0000 mg | ORAL_TABLET | Freq: Three times a day (TID) | ORAL | Status: DC | PRN

## 2017-09-05 MED ORDER — LOSARTAN POTASSIUM 25 MG PO TABS
25.0000 mg | ORAL_TABLET | Freq: Every day | ORAL | Status: DC
  Administered 2017-09-05: 25 mg via ORAL
  Filled 2017-09-05 (×2): qty 1

## 2017-09-05 MED ORDER — PRAVASTATIN SODIUM 20 MG PO TABS
20.0000 mg | ORAL_TABLET | Freq: Every day | ORAL | Status: DC
  Administered 2017-09-05: 20 mg via ORAL
  Filled 2017-09-05 (×2): qty 1

## 2017-09-05 MED ORDER — ALBUTEROL SULFATE HFA 108 (90 BASE) MCG/ACT IN AERS: 2.0000 | INHALATION_SPRAY | Freq: Four times a day (QID) | RESPIRATORY_TRACT | Status: DC | PRN

## 2017-09-05 NOTE — ED Notes (Signed)
Pt stated "I've been seeing the [place].  They've picked me up on the bus.  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  My mother & father bought the place.  We moved from [place].  My father was a piece of s---.  He took his disability check and got a paycheck.  My mother worked her butt off for that place.  I have a son and 2 daughter's."

## 2017-09-05 NOTE — ED Provider Notes (Signed)
[facility] DEPT Provider Note   CSN: [id number] Arrival date & time: 09/05/17  1025     History   Chief Complaint Chief Complaint  Patient presents with  . Alcohol Intoxication  . Post-Traumatic Stress Disorder    HPI Adrian Schultz is a 51 y.o. male with a history of diabetes, alcohol abuse, hypertension, hyperlipidemia and PTSD who presents to the ED via police for alcohol intoxication and PTSD. Police reports that the patient lives at [employer] and was found by security with knife in hand walking around "acting strange". The patient was found by police on the floor, curled up, crying and holding the knife. The patient reports that he had a "PTSD flare" but says "I am to embarrassed to say what happened, why it happened or what I felt". He repeatedly says "please lord forgive me". He is noticeably intoxicated. States he drinks 12 beers a day and today has only drank 2 beers. He appears anxious and tearful. The patient reports he was unsure why he was walking around with a knife. He denies HI but states he is afraid he will hurt someone. When asked about SI he says that he has had SI since 1992. He says he has a plan but does not specify what saying "don't look at me, I am to embarrassed". He says he has tried to harm himself in the past but will not specify how. The patient will not report any physical complaints.   HPI  Past Medical History:  Diagnosis Date  . Diabetes mellitus without complication ([place])   . ETOH abuse   . Hypercholesteremia   . Hypertension   . Suicidal ideation     Patient Active Problem List   Diagnosis Date Noted  . Hypercholesteremia 07/04/2017  . Bronchiectasis ([place]) 06/17/2017  . Type 2 diabetes mellitus without complication, without long-term current use of insulin ([place]) 04/05/2017  . Intoxication 07/09/2013    Past Surgical History:  Procedure Laterality Date  . FOOT SURGERY    . KNEE SURGERY         Home Medications    Prior to  Admission medications   Medication Sig Start Date End Date Taking? Authorizing Provider  acetaminophen (TYLENOL) 500 MG tablet Take 2 tablets (1,000 mg total) by mouth every 8 (eight) hours as needed for moderate pain. 06/14/17   Alfonse Spruce, FNP  albuterol (PROVENTIL HFA;VENTOLIN HFA) 108 (90 Base) MCG/ACT inhaler Inhale 2 puffs into the lungs every 6 (six) hours as needed for wheezing or shortness of breath. 05/16/17   Alfonse Spruce, FNP  Blood Glucose Monitoring Suppl (TRUE METRIX METER) w/Device KIT 1 each by Does not apply route daily. 04/26/16   Langeland, Dawn T, MD  escitalopram (LEXAPRO) 10 MG tablet Take 1 tablet (10 mg total) by mouth daily. 07/15/17   Alfonse Spruce, FNP  glipiZIDE (GLUCOTROL) 10 MG tablet Take half a tablet by mouth daily before breakfast. 07/04/17   Alfonse Spruce, FNP  glucose blood (TRUE METRIX BLOOD GLUCOSE TEST) test strip Use as instructed 08/29/17   Alfonse Spruce, FNP  ibuprofen (ADVIL,MOTRIN) 600 MG tablet Take 1 tablet (600 mg total) by mouth every 6 (six) hours as needed for moderate pain or cramping (Take with food.). 07/04/17   Alfonse Spruce, FNP  LORazepam (ATIVAN) 1 MG tablet Take 1 tablet (1 mg total) by mouth 3 (three) times daily as needed for anxiety. 8/52/77   Delora Fuel, MD  losartan (COZAAR) 25 MG tablet  Take 1 tablet (25 mg total) by mouth daily. 07/04/17   Alfonse Spruce, FNP  meloxicam (MOBIC) 7.5 MG tablet Take 1 tablet (7.5 mg total) by mouth daily. 08/08/17   Alfonse Spruce, FNP  metFORMIN (GLUCOPHAGE XR) 500 MG 24 hr tablet Take 2 tablets (1,000 mg total) by mouth 2 (two) times daily with a meal. 07/04/17   Hairston, Toy Baker R, FNP  nicotine polacrilex (NICORETTE) 4 MG gum Take 1 each (4 mg total) by mouth as needed for smoking cessation. 07/04/17   Alfonse Spruce, FNP  pravastatin (PRAVACHOL) 20 MG tablet Take 1 tablet (20 mg total) by mouth daily. 07/17/17   Alfonse Spruce, FNP  sildenafil  (VIAGRA) 25 MG tablet Take 1-2 tablets (25-50 mg total) by mouth daily as needed for erectile dysfunction. 07/04/17   Alfonse Spruce, FNP  traMADol (ULTRAM) 50 MG tablet TAKE ONE TABLET BY MOUTH EVERY 8 HOURS AS NEEDED FOR SEVERE PAIN. USE SPARINGLY. 07/15/17   Alfonse Spruce, FNP  TRUEPLUS LANCETS 28G MISC 1 each by Does not apply route daily. 05/10/17   Alfonse Spruce, FNP    Family History No family history on file.  Social History Social History  Substance Use Topics  . Smoking status: Never Smoker  . Smokeless tobacco: Current User    Types: Snuff     Comment: daily since 51 years old  . Alcohol use 0.0 oz/week     Comment: beer - daily 1/2 a case      Allergies   Lisinopril   Review of Systems Review of Systems Level V caveat due to intoxication  Physical Exam Updated Vital Signs BP 121/78 (BP Location: Left Arm)   Pulse 88   Temp 97.7 F (36.5 C) (Oral)   Resp 16   SpO2 94%   Physical Exam  Constitutional: He appears well-developed and well-nourished.  HENT:  Head: Normocephalic and atraumatic.  Right Ear: External ear normal.  Left Ear: External ear normal.  Nose: Nose normal.  Mouth/Throat: Uvula is midline, oropharynx is clear and moist and mucous membranes are normal. No tonsillar exudate.  Eyes: Pupils are equal, round, and reactive to light. Right eye exhibits no discharge. Left eye exhibits no discharge. No scleral icterus.  Neck: Trachea normal. Neck supple. No spinous process tenderness present. No neck rigidity. Normal range of motion present.  Cardiovascular: Regular rhythm and intact distal pulses.  Tachycardia present.   No murmur heard. Pulses:      Radial pulses are 2+ on the right side, and 2+ on the left side.       Dorsalis pedis pulses are 2+ on the right side, and 2+ on the left side.       Posterior tibial pulses are 2+ on the right side, and 2+ on the left side.  No lower extremity swelling or edema. Calves symmetric in  size bilaterally.  Pulmonary/Chest: Effort normal and breath sounds normal. He exhibits no tenderness.  Abdominal: Soft. Bowel sounds are normal. There is no tenderness. There is no rebound and no guarding.  Musculoskeletal: He exhibits no edema.  Lymphadenopathy:    He has no cervical adenopathy.  Neurological: He is alert.  Skin: Skin is warm and dry. No rash noted. He is not diaphoretic.  Psychiatric: His mood appears anxious. He is agitated.  Nursing note and vitals reviewed.    ED Treatments / Results  Labs (all labs ordered are listed, but only abnormal results are displayed) Labs Reviewed  COMPREHENSIVE METABOLIC PANEL - Abnormal; Notable for the following:       Result Value   Chloride 99 (*)    Glucose, Bld 199 (*)    Calcium 8.8 (*)    Total Protein 8.6 (*)    All other components within normal limits  ETHANOL - Abnormal; Notable for the following:    Alcohol, Ethyl (B) 303 (*)    All other components within normal limits  ACETAMINOPHEN LEVEL - Abnormal; Notable for the following:    Acetaminophen (Tylenol), Serum <10 (*)    All other components within normal limits  RAPID URINE DRUG SCREEN, HOSP PERFORMED  CBC WITH DIFFERENTIAL/PLATELET  SALICYLATE LEVEL    EKG  EKG Interpretation  Date/Time:  Thursday September 05 2017 21:45:01 EDT Ventricular Rate:  99 PR Interval:    QRS Duration: 87 QT Interval:  329 QTC Calculation: 425 R Axis:   125 Text Interpretation:  Right and left arm electrode reversal, Sinus or ectopic atrial tachycardia Low voltage, extremity and precordial leads Confirmed by Dorie Rank [phone]) on 09/05/2017 10:12:00 PM       Radiology No results found.  Procedures Procedures (including critical care time)  Medications Ordered in ED Medications  acetaminophen (TYLENOL) tablet 1,000 mg (not administered)  albuterol (PROVENTIL HFA;VENTOLIN HFA) 108 (90 Base) MCG/ACT inhaler 2 puff (not administered)  escitalopram (LEXAPRO) tablet 10 mg  (not administered)  glipiZIDE (GLUCOTROL) tablet 5 mg (not administered)  ipratropium-albuterol (DUONEB) 0.5-2.5 (3) MG/3ML nebulizer solution 3 mL (not administered)  meloxicam (MOBIC) tablet 7.5 mg (not administered)  losartan (COZAAR) tablet 25 mg (not administered)  metFORMIN (GLUCOPHAGE-XR) 24 hr tablet 1,000 mg (not administered)  pravastatin (PRAVACHOL) tablet 20 mg (not administered)     Initial Impression / Assessment and Plan / ED Course  I have reviewed the triage vital signs and the nursing notes.  Pertinent labs & imaging results that were available during my care of the patient were reviewed by me and considered in my medical decision making (see chart for details).     Pt brought here by police for PTSD episode while at [employer]. Patient was walking with a knife. He admits to [facility] and fear of HI. Patient has weapons at home he states. He is noticeably intoxicated. His demeanor is dysphoric. He is aggressive, anxious and verbally fighting with nursing staff. Patient becomes tearful very easily. Patient tried leaving and I placed IVC under Dr. Tomi Bamberger due to fear of patient harming himself or others. Rapid urine drug screen without any drugs detected. Etoh noted to be 303. Patient slightly tachycardic. Reassuring ECG. Likely due to intoxication. CMP without electrolyte abnormalities. CBC reassuring. Acetaminophen and salicylate levels appropriate. Psych hold orders placed and home meds re-ordered. TTS recommends overnight observation with re-eval in morning.    Final Clinical Impressions(s) / ED Diagnoses   Final diagnoses:  Acute alcoholic intoxication without complication ([place])  Involuntary commitment  Encounter for medical clearance for patient hold  Suicidal ideation  History of posttraumatic stress disorder (PTSD)    New Prescriptions New Prescriptions   No medications on file     Lorelle Gibbs 09/05/17 2228    Dorie Rank, MD 09/06/17 1245

## 2017-09-05 NOTE — BH Assessment (Signed)
Nira Conn, NP recommends observation overnight. Patient is intoxicated and currently being observed. Provider will re-evaluated in the morning.

## 2017-09-05 NOTE — BH Assessment (Signed)
Tele Assessment Note   Patient Name: Adrian Schultz MRN: [id number] Referring Physician: Jacinto Halim, PA-C Location of Patient: [place] Location of Provider: Behavioral Health TTS Department  Adrian Schultz is an 51 y.o. male presenting to the ED under IVC. The paperwork reports the patient was found with a knife at the home where he was living, risk of danger to self and other. Patient's lab work indicates an alcohol level of 303 at 1955. The patient minimized his drinking. Reports he relapsed one month ago. Denies daily drinking. Reports having 2.5 - 12 oz beers today. The patient denied SI or HI. Admits to seeing shadows or burst of light on occasion. Had reports of specific incidences of paranoia in the distant past, none currently. The patient is divorced and has been homeless on and off for five years. The patient lives at the [employer].   The patient states his arm hurt earlier and he thought he was having a heart attack earlier today. Confirms he talk to [employer] earlier today but thought he spoke to them at the hospital.  States he doesn't remember what they talking about. Denies threatening himself or anyone else. Admits to occasional blackouts but denies this is from alcohol abuse. Had one incident of SI with plan over three yrs ago. States his ex-wife was doing drugs and sleeping around. He drank liquor and then lie on railroad tracks. Passed out and woke up to the sound of the train. Reports he has three children and three grandchildren and could not do it. "I never had those thoughts again."  When asking the patient if he had a knife earlier today he reports, "yes, and someone better give it back. Its been in my family a long time, a [place]."   Nira Conn, NP recommends observation overnight. Patient is intoxicated and currently being observed. Provider will re-evaluated in the morning.   Diagnosis: Substance induced mood disorder  Past Medical History:  Past Medical History:   Diagnosis Date  . Diabetes mellitus without complication (HCC)   . ETOH abuse   . Hypercholesteremia   . Hypertension   . Suicidal ideation     Past Surgical History:  Procedure Laterality Date  . FOOT SURGERY    . KNEE SURGERY      Family History: No family history on file.  Social History:  reports that he has never smoked. His smokeless tobacco use includes Snuff. He reports that he drinks alcohol. He reports that he does not use drugs.  Additional Social History:     CIWA: CIWA-Ar BP: 121/78 Pulse Rate: 88 COWS:    PATIENT STRENGTHS: (choose at least two) Average or above average intelligence General fund of knowledge  Allergies:  Allergies  Allergen Reactions  . Lisinopril Cough    Home Medications:  (Not in a hospital admission)  OB/GYN Status:  No LMP for male patient.  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,homeless 5 yrs) Can pt return to current living arrangement?: Yes Admission Status: Involuntary Is patient capable of signing voluntary admission?: No Referral Source: Self/Family/Friend Insurance type: self pay  Medical Screening Exam [facility] Walk-in ONLY) Medical Exam completed: Yes  Crisis Care Plan Living Arrangements: Other (Comment) Artist -  2.5 months,homeless 5 yrs) Name of Psychiatrist: n/a Name of Therapist: n/a  Education Status Is patient currently in school?: No  Risk to self with the past 6 months Suicidal Ideation: No Has patient been a risk to self within the past 6 months prior to admission? : No Suicidal Intent: No Has patient had any suicidal intent within the past 6 months prior to  admission? : No Is patient at risk for suicide?: No Suicidal Plan?: No Has patient had any suicidal plan within the past 6 months prior to admission? : No Access to Means: No Previous Attempts/Gestures: Yes How many times?: 1 Other Self Harm Risks: 3 yr ago had SI thoughts Triggers for Past Attempts: None known Intentional Self Injurious Behavior: None Family Suicide History: No Recent stressful life event(s): Other (Comment) (homelessness) Persecutory voices/beliefs?: No Depression: No Substance abuse history and/or treatment for substance abuse?: Yes Suicide prevention information given to non-admitted patients: Not applicable  Risk to Others within the past 6 months Homicidal Ideation: No Does patient have any lifetime risk of violence toward others beyond the six months prior to admission? : No Thoughts of Harm to Others: No Current Homicidal Intent: No Current Homicidal Plan: No Access to Homicidal Means: No Identified Victim:  (n/a) History of harm to others?: No Assessment of Violence: None Noted Violent Behavior Description: n/a Does patient have access to weapons?: Yes (Comment) (knife) Criminal Charges Pending?: No Does patient have a court date: No Is patient on probation?: No  Psychosis Hallucinations: Visual (yesterday- sees shadows, burst of light) Delusions: None noted (has in past)  Mental Status Report Appearance/Hygiene: Unable to Assess (electricity was off) Eye Contact: Unable to Assess (electricity was off) Motor Activity: Freedom of movement Speech: Logical/coherent Level of Consciousness: Alert Mood: Euthymic Affect: Unable to Assess (unable to see pt due to power outage) Anxiety Level: Minimal Thought Processes: Coherent, Relevant Judgement: Partial Orientation: Person, Place, Time, Situation Obsessive Compulsive Thoughts/Behaviors: None  Cognitive Functioning Concentration: Normal Memory: Recent Intact, Remote Intact IQ: Average Insight:  Fair Impulse Control: Fair Appetite: Fair Weight Loss: 0 Weight Gain: 0 Sleep: No Change Vegetative Symptoms: None  ADLScreening [facility] Assessment Services) Patient's cognitive ability adequate to safely complete daily activities?: Yes Patient able to express need for assistance with ADLs?: Yes Independently performs ADLs?: Yes (appropriate for developmental age)  Prior Inpatient Therapy Prior Inpatient Therapy: No  Prior Outpatient Therapy Prior Outpatient Therapy: Yes Prior Therapy Dates: went 2to 5 months Prior Therapy Facilty/Provider(s): [facility] Does patient have an ACCT team?: No Does patient have Intensive In-House Services?  : No Does patient have [facility] services? : Yes Does patient have P4CC services?: No  ADL Screening (condition at time of admission) Patient's cognitive ability adequate to safely complete daily activities?: Yes Patient able to express need for assistance with ADLs?: Yes Independently performs ADLs?: Yes (appropriate for developmental age)                  Additional Information 1:1 In Past 12 Months?: No CIRT Risk: No Elopement Risk: No Does patient have medical clearance?: Yes     Disposition:  Disposition Initial Assessment Completed for this Encounter: Yes Disposition of Patient: Other dispositions Other disposition(s): Other (Comment)  This service was provided via telemedicine using a 2-way, interactive audio and video technology.    Vonzell Schlatter Brystal Kildow 09/05/2017 9:01 PM

## 2017-09-05 NOTE — ED Notes (Signed)
TTS assessment in progress via tele psych. 

## 2017-09-05 NOTE — ED Notes (Signed)
Dr. Roselyn Bering informed of pt's BAL of 303.

## 2017-09-05 NOTE — ED Triage Notes (Signed)
Per [employer]-states he had a knife at the [employer] he was'nt threatening anyone-states he wants help for his PTSD-states he has been drinking

## 2017-09-05 NOTE — ED Notes (Signed)
Bed: ZO10 Expected date:  Expected time:  Means of arrival:  Comments: [employer]-ETOH/depressed

## 2017-09-06 DIAGNOSIS — Y908 Blood alcohol level of 240 mg/100 ml or more: Secondary | ICD-10-CM

## 2017-09-06 DIAGNOSIS — F1014 Alcohol abuse with alcohol-induced mood disorder: Secondary | ICD-10-CM

## 2017-09-06 LAB — CBG MONITORING, ED: Glucose-Capillary: 197 mg/dL — ABNORMAL HIGH (ref 65–99)

## 2017-09-06 MED ORDER — LORAZEPAM 1 MG PO TABS
0.0000 mg | ORAL_TABLET | Freq: Four times a day (QID) | ORAL | Status: DC
  Administered 2017-09-06: 1 mg via ORAL
  Filled 2017-09-06: qty 1

## 2017-09-06 MED ORDER — LORAZEPAM 2 MG/ML IJ SOLN: 0.0000 mg | Freq: Two times a day (BID) | INTRAMUSCULAR | Status: DC

## 2017-09-06 MED ORDER — LORAZEPAM 2 MG/ML IJ SOLN: 0.0000 mg | Freq: Four times a day (QID) | INTRAMUSCULAR | Status: DC

## 2017-09-06 MED ORDER — VITAMIN B-1 100 MG PO TABS: 100.0000 mg | ORAL_TABLET | Freq: Every day | ORAL | Status: DC

## 2017-09-06 MED ORDER — LORAZEPAM 1 MG PO TABS: 0.0000 mg | ORAL_TABLET | Freq: Two times a day (BID) | ORAL | Status: DC

## 2017-09-06 MED ORDER — THIAMINE HCL 100 MG/ML IJ SOLN: 100.0000 mg | Freq: Every day | INTRAMUSCULAR | Status: DC

## 2017-09-06 NOTE — Consult Note (Signed)
[place] Psychiatry Consult   Reason for Consult:  Alcohol abuse  Referring Physician:  EPD  Patient Identification: Adrian Schultz MRN:  [id number] Principal Diagnosis: Alcohol abuse with alcohol-induced mood disorder [facility]) Diagnosis:   Patient Active Problem List   Diagnosis Date Noted  . Alcohol abuse with alcohol-induced mood disorder ([place]) [F10.14] 09/06/2017  . Hypercholesteremia [E78.00] 07/04/2017  . Bronchiectasis ([place]) [J47.9] 06/17/2017  . Type 2 diabetes mellitus without complication, without long-term current use of insulin ([place]) [E11.9] 04/05/2017  . Intoxication [IMO0002] 07/09/2013    Total Time spent with patient: 30 minutes  Subjective:   Adrian Schultz is a 51 y.o. male patient admitted with Etoh abuse. Patient seen resting in bed. Adrian Schultz is awake, alert and oriented X4.  Denies suicidal or homicidal ideation. Denies auditory or visual hallucination and does not appear to be responding to internal stimuli. Patient has questing regarding ADS follow-up. Support, encouragement and reassurance was provided.   HPI:  Per TTS assessment note- Adrian Schultz is an 51 y.o. male presenting to the ED under IVC. The paperwork reports the patient was found with a knife at the home where he was living, risk of danger to self and other. Patient's lab work indicates an alcohol level of 303 at [place]. The patient minimized his drinking. Reports he relapsed one month ago. Denies daily drinking. Reports having 2.5 - 12 oz beers today. The patient denied SI or HI. Admits to seeing shadows or burst of light on occasion. Had reports of specific incidences of paranoia in the distant past, none currently. The patient is divorced and has been homeless on and off for five years. The patient lives at the [employer].   The patient states his arm hurt earlier and he thought he was having a heart attack earlier today. Confirms he talk to [employer] earlier today but thought he spoke to them at the  hospital.  States he doesn't remember what they talking about. Denies threatening himself or anyone else. Admits to occasional blackouts but denies this is from alcohol abuse. Had one incident of SI with plan over three yrs ago. States his ex-wife was doing drugs and sleeping around. He drank liquor and then lie on railroad tracks. Passed out and woke up to the sound of the train. Reports he has three children and three grandchildren and could not do it. "I never had those thoughts again."  When asking the patient if he had a knife earlier today he reports, "yes, and someone better give it back. Its been in my family a long time, a [place]."   Past Psychiatric History:   Risk to Self: Suicidal Ideation: No Suicidal Intent: No Is patient at risk for suicide?: No Suicidal Plan?: No Access to Means: No What has been your use of drugs/alcohol within the last 12 months?: alcohol How many times?: 1 Other Self Harm Risks: 3 yr ago had SI thoughts Triggers for Past Attempts: None known Intentional Self Injurious Behavior: None Risk to Others: Homicidal Ideation: No Thoughts of Harm to Others: No Current Homicidal Intent: No Current Homicidal Plan: No Access to Homicidal Means: No Identified Victim:  (n/a) History of harm to others?: No Assessment of Violence: None Noted Violent Behavior Description: n/a Does patient have access to weapons?: Yes (Comment) (knife) Criminal Charges Pending?: No Does patient have a court date: No Prior Inpatient Therapy: Prior Inpatient Therapy: No Prior Outpatient Therapy: Prior Outpatient Therapy: Yes Prior Therapy Dates: went 2to 5 months  Prior Therapy Facilty/Provider(s): [facility] Does patient have an ACCT team?: No Does patient have Intensive In-House Services?  : No Does patient have [facility] services? : Yes Does patient have P4CC services?: No  Past Medical History:  Past Medical History:  Diagnosis Date  . Diabetes mellitus without  complication ([place])   . ETOH abuse   . Hypercholesteremia   . Hypertension   . Suicidal ideation     Past Surgical History:  Procedure Laterality Date  . FOOT SURGERY    . KNEE SURGERY     Family History: No family history on file. Family Psychiatric  History:  Social History:  History  Alcohol Use  . 0.0 oz/week    Comment: beer - daily 1/2 a case      History  Drug Use No    Social History   Social History  . Marital status: Divorced    Spouse name: N/A  . Number of children: N/A  . Years of education: N/A   Occupational History  . Unemployeed    Social History Main Topics  . Smoking status: Never Smoker  . Smokeless tobacco: Current User    Types: Snuff     Comment: daily since 51 years old  . Alcohol use 0.0 oz/week     Comment: beer - daily 1/2 a case   . Drug use: No  . Sexual activity: Not Currently   Other Topics Concern  . None   Social History Narrative   Live in shelter - [employer]       Additional Social History:    Allergies:   Allergies  Allergen Reactions  . Lisinopril Cough    Labs:  Results for orders placed or performed during the hospital encounter of 09/05/17 (from the past 48 hour(s))  Comprehensive metabolic panel     Status: Abnormal   Collection Time: 09/05/17  7:55 PM  Result Value Ref Range   Sodium 136 135 - 145 mmol/L   Potassium 3.6 3.5 - 5.1 mmol/L   Chloride 99 (L) 101 - 111 mmol/L   CO2 26 22 - 32 mmol/L   Glucose, Bld 199 (H) 65 - 99 mg/dL   BUN 9 6 - 20 mg/dL   Creatinine, Ser 0.69 0.61 - 1.24 mg/dL   Calcium 8.8 (L) 8.9 - 10.3 mg/dL   Total Protein 8.6 (H) 6.5 - 8.1 g/dL   Albumin 4.0 3.5 - 5.0 g/dL   AST 23 15 - 41 U/L   ALT 25 17 - 63 U/L   Alkaline Phosphatase 104 38 - 126 U/L   Total Bilirubin 1.1 0.3 - 1.2 mg/dL   GFR calc non Af Amer >60 >60 mL/min   GFR calc Af Amer >60 >60 mL/min    Comment: (NOTE) The eGFR has been calculated using the CKD EPI equation. This calculation has not been  validated in all clinical situations. eGFR's persistently <60 mL/min signify possible Chronic Kidney Disease.    Anion gap 11 5 - 15  Ethanol     Status: Abnormal   Collection Time: 09/05/17  7:55 PM  Result Value Ref Range   Alcohol, Ethyl (B) 303 (HH) <10 mg/dL    Comment:        LOWEST DETECTABLE LIMIT FOR SERUM ALCOHOL IS 10 mg/dL FOR MEDICAL PURPOSES ONLY CRITICAL RESULT CALLED TO, READ BACK BY AND VERIFIED WITH: REAGAN,E AT 2043 ON 09/05/2017 BY MOSLEY,J   CBC with Diff     Status: None   Collection Time:  09/05/17  7:55 PM  Result Value Ref Range   WBC 10.0 4.0 - 10.5 K/uL   RBC 4.77 4.22 - 5.81 MIL/uL   Hemoglobin 15.2 13.0 - 17.0 g/dL   HCT 43.7 39.0 - 52.0 %   MCV 91.6 78.0 - 100.0 fL   MCH 31.9 26.0 - 34.0 pg   MCHC 34.8 30.0 - 36.0 g/dL   RDW 13.8 11.5 - 15.5 %   Platelets 194 150 - 400 K/uL   Neutrophils Relative % 54 %   Neutro Abs 5.4 1.7 - 7.7 K/uL   Lymphocytes Relative 38 %   Lymphs Abs 3.8 0.7 - 4.0 K/uL   Monocytes Relative 4 %   Monocytes Absolute 0.4 0.1 - 1.0 K/uL   Eosinophils Relative 4 %   Eosinophils Absolute 0.4 0.0 - 0.7 K/uL   Basophils Relative 0 %   Basophils Absolute 0.0 0.0 - 0.1 K/uL  Acetaminophen level     Status: Abnormal   Collection Time: 09/05/17  7:55 PM  Result Value Ref Range   Acetaminophen (Tylenol), Serum <10 (L) 10 - 30 ug/mL    Comment:        THERAPEUTIC CONCENTRATIONS VARY SIGNIFICANTLY. A RANGE OF 10-30 ug/mL MAY BE AN EFFECTIVE CONCENTRATION FOR MANY PATIENTS. HOWEVER, SOME ARE BEST TREATED AT CONCENTRATIONS OUTSIDE THIS RANGE. ACETAMINOPHEN CONCENTRATIONS >150 ug/mL AT 4 HOURS AFTER INGESTION AND >50 ug/mL AT 12 HOURS AFTER INGESTION ARE OFTEN ASSOCIATED WITH TOXIC REACTIONS.   Salicylate level     Status: None   Collection Time: 09/05/17  7:55 PM  Result Value Ref Range   Salicylate Lvl <5.6 2.8 - 30.0 mg/dL  Urine rapid drug screen (hosp performed)     Status: None   Collection Time: 09/05/17  9:08 PM   Result Value Ref Range   Opiates NONE DETECTED NONE DETECTED   Cocaine NONE DETECTED NONE DETECTED   Benzodiazepines NONE DETECTED NONE DETECTED   Amphetamines NONE DETECTED NONE DETECTED   Tetrahydrocannabinol NONE DETECTED NONE DETECTED   Barbiturates NONE DETECTED NONE DETECTED    Comment:        DRUG SCREEN FOR MEDICAL PURPOSES ONLY.  IF CONFIRMATION IS NEEDED FOR ANY PURPOSE, NOTIFY LAB WITHIN 5 DAYS.        LOWEST DETECTABLE LIMITS FOR URINE DRUG SCREEN Drug Class       Cutoff (ng/mL) Amphetamine      1000 Barbiturate      200 Benzodiazepine   433 Tricyclics       295 Opiates          300 Cocaine          300 THC              50   CBG monitoring, ED     Status: Abnormal   Collection Time: 09/06/17  7:45 AM  Result Value Ref Range   Glucose-Capillary 197 (H) 65 - 99 mg/dL    Current Facility-Administered Medications  Medication Dose Route Frequency Provider Last Rate Last Dose  . acetaminophen (TYLENOL) tablet 1,000 mg  1,000 mg Oral Q8H PRN Maczis, Barth Kirks, PA-C      . albuterol (PROVENTIL HFA;VENTOLIN HFA) 108 (90 Base) MCG/ACT inhaler 2 puff  2 puff Inhalation Q6H PRN Maczis, Barth Kirks, PA-C      . escitalopram (LEXAPRO) tablet 10 mg  10 mg Oral Daily Maczis, Barth Kirks, PA-C      . glipiZIDE (GLUCOTROL) tablet 5 mg  5 mg Oral QAC breakfast Maczis,  Barth Kirks, PA-C      . ipratropium-albuterol (DUONEB) 0.5-2.5 (3) MG/3ML nebulizer solution 3 mL  3 mL Nebulization Q20 Min PRN Fredia Beets R, FNP   3 mL at 05/16/17 1500  . ipratropium-albuterol (DUONEB) 0.5-2.5 (3) MG/3ML nebulizer solution 3 mL  3 mL Nebulization Q20 Min PRN Maczis, Barth Kirks, PA-C      . LORazepam (ATIVAN) injection 0-4 mg  0-4 mg Intravenous Q6H Quintella Reichert, MD       Or  . LORazepam (ATIVAN) tablet 0-4 mg  0-4 mg Oral Q6H Quintella Reichert, MD   1 mg at 09/06/17 0347  . [START ON 09/08/2017] LORazepam (ATIVAN) injection 0-4 mg  0-4 mg Intravenous Q12H Quintella Reichert, MD       Or  .  Derrill Memo ON 09/08/2017] LORazepam (ATIVAN) tablet 0-4 mg  0-4 mg Oral Q12H Quintella Reichert, MD      . losartan (COZAAR) tablet 25 mg  25 mg Oral Daily Jillyn Ledger, PA-C   25 mg at 09/05/17 2224  . meloxicam (MOBIC) tablet 7.5 mg  7.5 mg Oral Daily Jillyn Ledger, PA-C   7.5 mg at 09/06/17 0847  . metFORMIN (GLUCOPHAGE-XR) 24 hr tablet 1,000 mg  1,000 mg Oral BID WC Jillyn Ledger, PA-C   1,000 mg at 09/06/17 0233  . pravastatin (PRAVACHOL) tablet 20 mg  20 mg Oral Daily Jillyn Ledger, PA-C   20 mg at 09/05/17 2224  . thiamine (VITAMIN B-1) tablet 100 mg  100 mg Oral Daily Quintella Reichert, MD       Or  . thiamine (B-1) injection 100 mg  100 mg Intravenous Daily Quintella Reichert, MD       Current Outpatient Prescriptions  Medication Sig Dispense Refill  . acetaminophen (TYLENOL) 500 MG tablet Take 2 tablets (1,000 mg total) by mouth every 8 (eight) hours as needed for moderate pain. 30 tablet 1  . albuterol (PROVENTIL HFA;VENTOLIN HFA) 108 (90 Base) MCG/ACT inhaler Inhale 2 puffs into the lungs every 6 (six) hours as needed for wheezing or shortness of breath. 1 Inhaler 1  . Blood Glucose Monitoring Suppl (TRUE METRIX METER) w/Device KIT 1 each by Does not apply route daily. 1 kit 0  . escitalopram (LEXAPRO) 10 MG tablet Take 1 tablet (10 mg total) by mouth daily. 30 tablet 2  . glipiZIDE (GLUCOTROL) 10 MG tablet Take half a tablet by mouth daily before breakfast. 90 tablet 3  . glucose blood (TRUE METRIX BLOOD GLUCOSE TEST) test strip Use as instructed 100 each 12  . LORazepam (ATIVAN) 1 MG tablet Take 1 tablet (1 mg total) by mouth 3 (three) times daily as needed for anxiety. 6 tablet 0  . metFORMIN (GLUCOPHAGE XR) 500 MG 24 hr tablet Take 2 tablets (1,000 mg total) by mouth 2 (two) times daily with a meal. 120 tablet 3  . sildenafil (VIAGRA) 25 MG tablet Take 1-2 tablets (25-50 mg total) by mouth daily as needed for erectile dysfunction. 10 tablet 11  . TRUEPLUS LANCETS 28G MISC 1  each by Does not apply route daily. 100 each 11  . ibuprofen (ADVIL,MOTRIN) 600 MG tablet Take 1 tablet (600 mg total) by mouth every 6 (six) hours as needed for moderate pain or cramping (Take with food.). (Patient not taking: Reported on 09/06/2017) 30 tablet 1  . losartan (COZAAR) 25 MG tablet Take 1 tablet (25 mg total) by mouth daily. (Patient not taking: Reported on 09/06/2017) 30 tablet 2  . meloxicam (MOBIC)  7.5 MG tablet Take 1 tablet (7.5 mg total) by mouth daily. (Patient not taking: Reported on 09/06/2017) 30 tablet 1  . nicotine polacrilex (NICORETTE) 4 MG gum Take 1 each (4 mg total) by mouth as needed for smoking cessation. (Patient not taking: Reported on 09/06/2017) 100 tablet 1  . pravastatin (PRAVACHOL) 20 MG tablet Take 1 tablet (20 mg total) by mouth daily. (Patient not taking: Reported on 09/06/2017) 90 tablet 3  . traMADol (ULTRAM) 50 MG tablet TAKE ONE TABLET BY MOUTH EVERY 8 HOURS AS NEEDED FOR SEVERE PAIN. USE SPARINGLY. (Patient not taking: Reported on 09/06/2017) 30 tablet 0    Musculoskeletal: Strength & Muscle Tone: within normal limits Gait & Station: normal Patient leans: N/A  Psychiatric Specialty Exam:  Physical Exam  Nursing note and vitals reviewed. Constitutional: He appears well-developed.  Psychiatric: He has a normal mood and affect. His behavior is normal.    Review of Systems  Psychiatric/Behavioral: Positive for substance abuse. Negative for depression (stable ) and suicidal ideas.    Blood pressure 140/84, pulse 80, temperature 98.5 F (36.9 C), temperature source Oral, resp. rate 16, SpO2 96 %.There is no height or weight on file to calculate BMI.   General Appearance: Casual  Eye Contact::  Fair  Speech:  Clear and Coherent409  Volume:  Normal  Mood:  Anxious  Affect:  Congruent  Thought Process:  Coherent  Orientation:  Full (Time, Place, and Person)  Thought Content:  Hallucinations: None  Suicidal Thoughts:  No  Homicidal Thoughts:   No  Memory:  Immediate;   Fair Recent;   Fair Remote;   Fair  Judgement:  Fair  Insight:  Fair  Psychomotor Activity:  Normal  Concentration:  Good  Recall:  [employer] of Knowledge:Fair  Language: Good  Akathisia:  No  Handed:  Right  AIMS (if indicated):     Assets:  Communication Skills Desire for Improvement Resilience Social Support  Sleep:     Cognition: WNL  ADL's:  Intact    noted per TTS-Per Corena Pilgrim, MD, this pt does not require psychiatric hospitalization at this time.  Pt presents under IVC initiated by EDP Dorie Rank, MD, which Dr Darleene Cleaver has rescinded.  Pt [facility] with referral information for [facility] and for [facility] and Drug Services.  This has been included in pt's discharge instructions.  Pt would also benefit from seeing Peer Support Specialists; they will be asked to speak to pt.   Treatment Plan Summary: Daily contact with patient to assess and evaluate symptoms and progress in treatment  Disposition: No evidence of imminent risk to self or others at present.   Patient does not meet criteria for psychiatric inpatient admission. Supportive therapy provided about ongoing stressors. Refer to IOP. Discussed crisis plan, support from social network, calling 911, coming to the Emergency Department, and calling Suicide Hotline.  Derrill Center, NP 09/06/2017 1:03 PM  Patient seen face-to-face for psychiatric evaluation, chart reviewed and case discussed with the physician extender and developed treatment plan. Reviewed the information documented and agree with the treatment plan. Corena Pilgrim, MD

## 2017-09-06 NOTE — BHH Suicide Risk Assessment (Cosign Needed)
Suicide Risk Assessment  Discharge Assessment   [facility] Discharge Suicide Risk Assessment   Principal Problem: Alcohol abuse with alcohol-induced mood disorder [facility]) Discharge Diagnoses:  Patient Active Problem List   Diagnosis Date Noted  . Alcohol abuse with alcohol-induced mood disorder (HCC) [F10.14] 09/06/2017  . Hypercholesteremia [E78.00] 07/04/2017  . Bronchiectasis (HCC) [J47.9] 06/17/2017  . Type 2 diabetes mellitus without complication, without long-term current use of insulin (HCC) [E11.9] 04/05/2017  . Intoxication [IMO0002] 07/09/2013    Total Time spent with patient: 30 minutes  Musculoskeletal: Strength & Muscle Tone: within normal limits Gait & Station: normal Patient leans: N/A  Psychiatric Specialty Exam:   Blood pressure 140/84, pulse 80, temperature 98.5 F (36.9 C), temperature source Oral, resp. rate 16, SpO2 96 %.There is no height or weight on file to calculate BMI.  General Appearance: Casual  Eye Contact::  Fair  Speech:  Clear and Coherent409  Volume:  Normal  Mood:  Anxious  Affect:  Congruent  Thought Process:  Coherent  Orientation:  Full (Time, Place, and Person)  Thought Content:  Hallucinations: None  Suicidal Thoughts:  No  Homicidal Thoughts:  No  Memory:  Immediate;   Fair Recent;   Fair Remote;   Fair  Judgement:  Fair  Insight:  Fair  Psychomotor Activity:  Normal  Concentration:  Good  Recall:  [employer] of Knowledge:Fair  Language: Good  Akathisia:  No  Handed:  Right  AIMS (if indicated):     Assets:  Communication Skills Desire for Improvement Resilience Social Support  Sleep:     Cognition: WNL  ADL's:  Intact   Mental Status Per Nursing Assessment::   On Admission:     Demographic Factors:  Male and Low socioeconomic status  Loss Factors: Financial problems/change in socioeconomic status  Historical Factors: Impulsivity  Risk Reduction Factors:   NA  Continued Clinical Symptoms:  Alcohol/Substance  Abuse/Dependencies  Cognitive Features That Contribute To Risk:  Closed-mindedness    Suicide Risk:  Minimal: No identifiable suicidal ideation.  Patients presenting with no risk factors but with morbid ruminations; may be classified as minimal risk based on the severity of the depressive symptoms    Plan Of Care/Follow-up recommendations:  Activity:  as tolerated Diet:  heart healthy  Oneta Rack, NP 09/06/2017, 12:59 PM

## 2017-09-06 NOTE — Patient Outreach (Signed)
ED Peer Support Specialist Patient Intake (Complete at intake & 30-60 Day Follow-up)  Name: Adrian Schultz  MRN: [id number]  Age: 51 y.o.   Date of Admission: 09/06/2017  Intake: Initial Comments:      Primary Reason Admitted: alcohol use, suicidal ideations  Lab values: Alcohol/ETOH: Positive Positive UDS? No Amphetamines: No Barbiturates: No Benzodiazepines: No Cocaine: No Opiates: No Cannabinoids: No  Demographic information: Gender: Male Ethnicity: White Marital Status: Divorced Insurance Status: Uninsured/Self-pay Control and instrumentation engineer (Work Engineer, agricultural, Sales executive, etc.: Yes (Food stamps) Lives with: Other (comment) Building services engineer of [place]) Living situation:  [facility] [place])  Reported Patient History: Patient reported health conditions: Depression, Anxiety disorders Patient aware of HIV and hepatitis status: Yes (comment) (Negative)  In past year, has patient visited ED for any reason? Yes  Number of ED visits: 1  Reason(s) for visit: Anxiety panic attack  In past year, has patient been hospitalized for any reason? No  Number of hospitalizations:    Reason(s) for hospitalization:    In past year, has patient been arrested? No  Number of arrests:    Reason(s) for arrest:    In past year, has patient been incarcerated? No  Number of incarcerations:    Reason(s) for incarceration:    In past year, has patient received medication-assisted treatment? No  In past year, patient received the following treatments: Individual therapy (Individual therapy at [facility] and at the [facility])  In past year, has patient received any harm reduction services? No  Did this include any of the following?    In past year, has patient received care from a mental health provider for diagnosis other than SUD? Yes  In past year, is this first time patient has overdosed? No  Number of past overdoses: 0  In past year, is  this first time patient has been hospitalized for an overdose? No  Number of hospitalizations for overdose(s): 0  Is patient currently receiving treatment for a mental health diagnosis? No  Patient reports experiencing difficulty participating in SUD treatment: No    Most important reason(s) for this difficulty?    Has patient received prior services for treatment? Yes (Substance use treatment in [place])  In past, patient has received services from following agencies:  [facility] and [facility] individual therapy in the past year)  Plan of Care:  Suggested follow up at these agencies/treatment centers:  [facility] for mental health treatment and [facility] for substance use outpatient treatment)  Other information:    Bartholomew Boards, CPSS  09/06/2017 1:14 PM

## 2017-09-06 NOTE — Discharge Instructions (Signed)
For your ongoing mental health needs, you are advised to follow up with [facility].  New and returning patients are seen at their walk-in clinic.  Walk-in hours are Monday - Friday from 8:00 am - 3:00 pm.  Walk-in patients are seen on a first come, first served basis.  Try to arrive as early as possible for he best chance of being seen the same day:       [facility]      [address]      [place], [place] [address]      [phone]  To help you maintain a sober lifestyle, a substance abuse treatment program may be beneficial to you.  Contact [facility] at your earliest opportunity to ask about enrolling in their program:       [facility]      [address], [place] [address]      [phone]      New patients are seen at the walk-in clinic every Tuesday from 9:00 am - 12:00 pm.

## 2017-09-06 NOTE — ED Notes (Addendum)
Pt questioning location of his wallet.  Pt informed it remains in the personal belonging bag & locked in the locker.  Pt verbalized understanding.

## 2017-09-06 NOTE — BH Assessment (Addendum)
BHH Assessment Progress Note  Per Thedore Mins, MD, this pt does not require psychiatric hospitalization at this time.  Pt presents under IVC initiated by EDP Linwood Dibbles, MD, which Dr Jannifer Franklin has rescinded.  Pt is to be discharged from [facility] with referral information for [facility] and for [facility].  This has been included in pt's discharge instructions.  Pt would also benefit from seeing Peer Support Specialists; they will be asked to speak to pt.  Pt's nurse has been notified.  Doylene Canning, MA Triage Specialist [phone]

## 2017-09-06 NOTE — ED Notes (Signed)
Questioned pt how frequently albuterol was used.  Pt stated "rarely".

## 2017-09-09 ENCOUNTER — Ambulatory Visit: Payer: No Typology Code available for payment source | Admitting: Family Medicine

## 2017-09-09 ENCOUNTER — Telehealth: Payer: Self-pay | Admitting: Family Medicine

## 2017-09-09 DIAGNOSIS — E119 Type 2 diabetes mellitus without complications: Secondary | ICD-10-CM

## 2017-09-09 DIAGNOSIS — R739 Hyperglycemia, unspecified: Secondary | ICD-10-CM

## 2017-09-09 NOTE — Telephone Encounter (Signed)
Pt called to request a refill for glucose blood (TRUE METRIX BLOOD GLUCOSE TEST) test strip  traMADol (ULTRAM) 50 MG tablet  LORazepam (ATIVAN) 1 MG tablet   Please sent it to the pharmacy on [address]

## 2017-09-10 MED ORDER — GLUCOSE BLOOD VI STRP: ORAL_STRIP | 12 refills | Status: DC

## 2017-09-10 NOTE — Telephone Encounter (Signed)
I have reviewed his most recent ED notes. Unfortunately I will be unable to prescribe controlled substances for him. Advised to take meloxicam which he already has for pain and keep his appointment with his PCP.

## 2017-09-10 NOTE — Telephone Encounter (Signed)
Test strips refilled. Patient is aware that PCP will not prescribe him lorazepam as he has been told multiple times. Patient got UDS at ED a few days ago. Will forward request for tramadol to Dr. Venetia Night but he may need to be seen by PCP prior to any refills.Marland Kitchen

## 2017-09-13 ENCOUNTER — Other Ambulatory Visit: Payer: Self-pay | Admitting: Acute Care

## 2017-09-13 DIAGNOSIS — R9389 Abnormal findings on diagnostic imaging of other specified body structures: Secondary | ICD-10-CM

## 2017-09-18 NOTE — Congregational Nurse Program (Signed)
Congregational Nurse Program Note  Date of Encounter: 09/18/2017  Past Medical History: Past Medical History:  Diagnosis Date  . Diabetes mellitus without complication (HCC)   . ETOH abuse   . Hypercholesteremia   . Hypertension   . Suicidal ideation     Encounter Details:     CNP Questionnaire - 09/18/17 1912      Patient Demographics   Is this a new or existing patient? New   Race Caucasian/White     Patient Assistance   Location of Patient Assistance Not Applicable   Patient's financial/insurance status Orange Research officer, trade unionCard/Care Connects   Uninsured Patient (Orange Card/Care Connects) Yes   Interventions Follow-up/Education/Support provided after completed appt.   Patient referred to apply for the following financial assistance [employer] Card/Care Connects Renewal   Food insecurities addressed Not Applicable   Transportation assistance Yes   Type of Assistance Bus Pass Given   Assistance securing medications No   Educational health offerings Navigating the healthcare system     Encounter Details   Primary purpose of visit Navigating the Healthcare System;Spiritual Care/Support Visit;Education/Health Concerns   Was an Emergency Department visit averted? Not Applicable   Does patient have a medical provider? Yes   Patient referred to Follow up with established PCP   Was a mental health screening completed? (GAINS tool) No   Does patient have dental issues? No   Does patient have vision issues? No   Does your patient have an abnormal blood pressure today? No   Since previous encounter, have you referred patient for abnormal blood pressure that resulted in a new diagnosis or medication change? No   Does your patient have an abnormal blood glucose today? No   Since previous encounter, have you referred patient for abnormal blood glucose that resulted in a new diagnosis or medication change? No   Was there a life-saving intervention made? No    Initial visit for this client ,in to  see nurse because he has an appointment tomorrow @[facility]  to get his eligibility for his orange card renewed and has no transportation. [facility] ,[facility] ,is a diabetic ,taked his metformin and in need of test strips ,had just had dinner and [facility] and get his A!-C done.,blood sugars per client are usually within the 100 mg range ,sometimes higher ,takes his medications he states ,will check with pharmacy tomorrow regarding his strips. Started  working 11/2 weeks ago 4 hours  A day for 12 weeks at [employer]  ,has 3 adult children, 2  daughters and 1 son . Wife and him are divorced. 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 Marland Kitchen. [facility] but has [facility] last several  appointments ,encouraged to re connect .  will return next week before [facility].will get baseline vitals

## 2017-09-19 ENCOUNTER — Ambulatory Visit: Payer: Self-pay

## 2017-09-24 DIAGNOSIS — E0800 Diabetes mellitus due to underlying condition with hyperosmolarity without nonketotic hyperglycemic-hyperosmolar coma (NKHHC): Secondary | ICD-10-CM

## 2017-09-24 LAB — GLUCOSE, POCT (MANUAL RESULT ENTRY): POC Glucose: 204 mg/dl (ref 70–99)

## 2017-09-24 NOTE — Congregational Nurse Program (Signed)
Congregational Nurse Program Note  Date of Encounter: 09/24/2017  Past Medical History: Past Medical History:  Diagnosis Date  . Diabetes mellitus without complication (HCC)   . ETOH abuse   . Hypercholesteremia   . Hypertension   . Suicidal ideation     Encounter Details:     CNP Questionnaire - 09/24/17 1954      Patient Demographics   Is this a new or existing patient? Existing   Patient is considered a/an Not Applicable   Race Caucasian/White     Patient Assistance   Location of Patient Assistance Not Applicable   Patient's financial/insurance status Orange Research officer, trade unionCard/Care Connects   Uninsured Patient (Orange Card/Care Connects) Yes   Interventions Follow-up/Education/Support provided after completed appt.;Appt. has been completed   Patient referred to apply for the following financial assistance [employer] Card/Care Connects   Food insecurities addressed Not Applicable   Transportation assistance Yes   Type of Assistance Altria GroupBus Pass Given   Assistance securing medications No   Educational health offerings Medications;Chronic disease;Diabetes;Hypertension     Encounter Details   Primary purpose of visit Education/Health Concerns;Spiritual Care/Support Visit;Navigating the Healthcare System   Was an Emergency Department visit averted? Not Applicable   Does patient have a medical provider? Yes   Patient referred to Clinic;Follow up with established PCP   Was a mental health screening completed? (GAINS tool) No   Does patient have dental issues? No   Does patient have vision issues? No   Does your patient have an abnormal blood pressure today? No   Since previous encounter, have you referred patient for abnormal blood pressure that resulted in a new diagnosis or medication change? No   Does your patient have an abnormal blood glucose today? Yes   Since previous encounter, have you referred patient for abnormal blood glucose that resulted in a new diagnosis or medication change? No    Was there a life-saving intervention made? No     Client secured his  Erskine EmeryOrange  Card  Last week and [facility] and was requesting bus ticket .   Bus tickets given and counseled regarding his blood sugar and his A1 -C . Nutrition counseling also given . Client continues to dip snuff and was counseled ,coming down with a cold, counseled regarding the need for a flu shot after he gets over UR concerns. Nurse counseled regarding transportation and case manager has completed paper work for [employer]. If that doesn't occur will try [employer] transportation  Expressed symptoms of body aches ,no appetite ,encouraged to drink fluids and gargle throat and eat vegetables .  Follow up after visit with PCP

## 2017-09-25 ENCOUNTER — Ambulatory Visit: Payer: Self-pay | Attending: Family Medicine | Admitting: Family Medicine

## 2017-09-25 ENCOUNTER — Encounter: Payer: Self-pay | Admitting: Family Medicine

## 2017-09-25 VITALS — BP 128/81 | HR 103 | Temp 99.2°F | Resp 18 | Ht 67.0 in | Wt 195.2 lb

## 2017-09-25 DIAGNOSIS — E119 Type 2 diabetes mellitus without complications: Secondary | ICD-10-CM | POA: Insufficient documentation

## 2017-09-25 DIAGNOSIS — Z79899 Other long term (current) drug therapy: Secondary | ICD-10-CM | POA: Insufficient documentation

## 2017-09-25 DIAGNOSIS — Z7984 Long term (current) use of oral hypoglycemic drugs: Secondary | ICD-10-CM | POA: Insufficient documentation

## 2017-09-25 DIAGNOSIS — F329 Major depressive disorder, single episode, unspecified: Secondary | ICD-10-CM | POA: Insufficient documentation

## 2017-09-25 DIAGNOSIS — G8929 Other chronic pain: Secondary | ICD-10-CM | POA: Insufficient documentation

## 2017-09-25 DIAGNOSIS — F419 Anxiety disorder, unspecified: Secondary | ICD-10-CM

## 2017-09-25 DIAGNOSIS — M25511 Pain in right shoulder: Secondary | ICD-10-CM | POA: Insufficient documentation

## 2017-09-25 DIAGNOSIS — F410 Panic disorder [episodic paroxysmal anxiety] without agoraphobia: Secondary | ICD-10-CM | POA: Insufficient documentation

## 2017-09-25 DIAGNOSIS — I10 Essential (primary) hypertension: Secondary | ICD-10-CM | POA: Insufficient documentation

## 2017-09-25 DIAGNOSIS — Z8659 Personal history of other mental and behavioral disorders: Secondary | ICD-10-CM

## 2017-09-25 LAB — POCT GLYCOSYLATED HEMOGLOBIN (HGB A1C): HEMOGLOBIN A1C: 7.5

## 2017-09-25 LAB — GLUCOSE, POCT (MANUAL RESULT ENTRY): POC Glucose: 230 mg/dl — AB (ref 70–99)

## 2017-09-25 MED ORDER — TRUE METRIX METER W/DEVICE KIT: 1.0000 | PACK | Freq: Every day | 0 refills | Status: DC

## 2017-09-25 MED ORDER — ESCITALOPRAM OXALATE 20 MG PO TABS: 20.0000 mg | ORAL_TABLET | Freq: Every day | ORAL | 3 refills | Status: DC

## 2017-09-25 MED ORDER — TRAMADOL HCL 50 MG PO TABS: ORAL_TABLET | ORAL | 0 refills | Status: DC

## 2017-09-25 MED ORDER — LOSARTAN POTASSIUM 25 MG PO TABS: 25.0000 mg | ORAL_TABLET | Freq: Every day | ORAL | 2 refills | Status: DC

## 2017-09-25 MED ORDER — MELOXICAM 7.5 MG PO TABS: 7.5000 mg | ORAL_TABLET | Freq: Every day | ORAL | 2 refills | Status: DC | PRN

## 2017-09-25 MED FILL — MELOXICAM 7.5 MG TABLET: 7.5 | 30 days supply | Qty: 30 | Fill #1

## 2017-09-25 MED FILL — !TRUE METRIX BLOOD GLUCOSE: 365 days supply | Qty: 1 | Fill #0

## 2017-09-25 MED FILL — LOSARTAN POTASSIUM 25 MG TA: 25 | 30 days supply | Qty: 30 | Fill #0

## 2017-09-25 MED FILL — TRUE METRIX GLUCOSE TEST ST: 30 days supply | Qty: 100 | Fill #0

## 2017-09-25 MED FILL — ESCITALOPRAM 20 MG TABLET: 20 | 30 days supply | Qty: 30 | Fill #0

## 2017-09-25 NOTE — Progress Notes (Signed)
Subjective:  Patient ID: Adrian Schultz, male    DOB: 1966/05/03  Age: 51 y.o. MRN: [id number]  CC: Diabetes  HPI Adrian Schultz presents for follow up. History of  chronic right shoulder pain which has been present for several months. Pain is located in the right shoulder(s), is described as aching, and is moderate. Associated symptoms include: decreased ROM, burning pain, and right hand strength. Reports taking ibuprofen and tramadol for relief of symptoms. Previous work up included: xray. Related to injury: no. History of   anxiety disorder with recent ED visit for panic attack.  He has the following symptoms: difficulty concentrating, feelings of losing control, palpitations, racing thoughts, vomiting. Onset of symptoms was approximately patient reports since his mid 68's He denies current suicidal and homicidal ideation. Possible organic causes contributing are: history of ETOC abuse. Risk factors: previous episode of depression and homelesssness Previous treatment includes Ativan .  He complains of the following side effects from the treatment: none.  He reports upcoming appointment with therapy at [facility].Diabetes Mellitus: Patient presents for follow up of diabetes. Symptoms: none.  Patient denies foot ulcerations, paresthesia of the feet, visual disturbances, vomitting and weight loss.  Evaluation to date has been included: fasting blood sugar, fasting lipid panel and hemoglobin A1C.  Home sugars: patient does not consistentl check CBG's.   Outpatient Medications Prior to Visit  Medication Sig Dispense Refill  . acetaminophen (TYLENOL) 500 MG tablet Take 2 tablets (1,000 mg total) by mouth every 8 (eight) hours as needed for moderate pain. 30 tablet 1  . albuterol (PROVENTIL HFA;VENTOLIN HFA) 108 (90 Base) MCG/ACT inhaler Inhale 2 puffs into the lungs every 6 (six) hours as needed for wheezing or shortness of breath. 1 Inhaler 1  . glipiZIDE (GLUCOTROL) 10 MG tablet Take half a tablet by mouth  daily before breakfast. 90 tablet 3  . glucose blood (TRUE METRIX BLOOD GLUCOSE TEST) test strip Use as instructed 100 each 12  . ibuprofen (ADVIL,MOTRIN) 600 MG tablet Take 1 tablet (600 mg total) by mouth every 6 (six) hours as needed for moderate pain or cramping (Take with food.). (Patient not taking: Reported on 09/06/2017) 30 tablet 1  . metFORMIN (GLUCOPHAGE XR) 500 MG 24 hr tablet Take 2 tablets (1,000 mg total) by mouth 2 (two) times daily with a meal. 120 tablet 3  . nicotine polacrilex (NICORETTE) 4 MG gum Take 1 each (4 mg total) by mouth as needed for smoking cessation. (Patient not taking: Reported on 09/06/2017) 100 tablet 1  . pravastatin (PRAVACHOL) 20 MG tablet Take 1 tablet (20 mg total) by mouth daily. (Patient not taking: Reported on 09/06/2017) 90 tablet 3  . sildenafil (VIAGRA) 25 MG tablet Take 1-2 tablets (25-50 mg total) by mouth daily as needed for erectile dysfunction. 10 tablet 11  . TRUEPLUS LANCETS 28G MISC 1 each by Does not apply route daily. 100 each 11  . Blood Glucose Monitoring Suppl (TRUE METRIX METER) w/Device KIT 1 each by Does not apply route daily. 1 kit 0  . escitalopram (LEXAPRO) 10 MG tablet Take 1 tablet (10 mg total) by mouth daily. 30 tablet 2  . LORazepam (ATIVAN) 1 MG tablet Take 1 tablet (1 mg total) by mouth 3 (three) times daily as needed for anxiety. 6 tablet 0  . losartan (COZAAR) 25 MG tablet Take 1 tablet (25 mg total) by mouth daily. (Patient not taking: Reported on 09/06/2017) 30 tablet 2  . meloxicam (MOBIC) 7.5 MG tablet Take 1  tablet (7.5 mg total) by mouth daily. (Patient not taking: Reported on 09/06/2017) 30 tablet 1  . traMADol (ULTRAM) 50 MG tablet TAKE ONE TABLET BY MOUTH EVERY 8 HOURS AS NEEDED FOR SEVERE PAIN. USE SPARINGLY. (Patient not taking: Reported on 09/06/2017) 30 tablet 0   Facility-Administered Medications Prior to Visit  Medication Dose Route Frequency Provider Last Rate Last Dose  . ipratropium-albuterol (DUONEB)  0.5-2.5 (3) MG/3ML nebulizer solution 3 mL  3 mL Nebulization Q20 Min PRN Balian Schaller R, FNP   3 mL at 05/16/17 1500    ROS Review of Systems  Constitutional: Negative.   Eyes: Negative.   Respiratory: Negative.   Cardiovascular: Negative.   Gastrointestinal: Negative.   Musculoskeletal: Positive for arthralgias.  Skin: Negative.   Neurological: Negative.   Psychiatric/Behavioral: The patient is nervous/anxious.    Objective:  BP 128/81 (BP Location: Left Arm, Patient Position: Sitting, Cuff Size: Normal)   Pulse (!) 103   Temp 99.2 F (37.3 C) (Oral)   Resp 18   Ht 5' 7"  (1.702 m)   Wt 195 lb 3.2 oz (88.5 kg)   SpO2 97%   BMI 30.57 kg/m   BP/Weight 09/25/2017 09/06/2017 02/08/1760  Systolic BP 607 371 062  Diastolic BP 81 80 68  Wt. (Lbs) 195.2 - 190.2  BMI 30.57 - 28.92     Physical Exam  Constitutional: He appears well-developed and well-nourished.  Cardiovascular: Normal rate, regular rhythm, normal heart sounds and intact distal pulses.   Pulmonary/Chest: Effort normal and breath sounds normal.  Abdominal: Soft. Bowel sounds are normal.  Musculoskeletal:       Right shoulder: He exhibits pain.  Skin: Skin is warm and dry.  Psychiatric: His mood appears anxious. He expresses no homicidal and no suicidal ideation. He expresses no suicidal plans and no homicidal plans.  Nursing note and vitals reviewed.   Assessment & Plan:   1. Type 2 diabetes mellitus without complication, without long-term current use of insulin ([place]) Encouraged more attention to diet. - Glucose (CBG) - HgB A1c - Blood Glucose Monitoring Suppl (TRUE METRIX METER) w/Device KIT; 1 each by Does not apply route daily.  Dispense: 1 kit; Refill: 0  2. Chronic right shoulder pain Consider referral to pain clinic. - Drug Screen, Urine - traMADol (ULTRAM) 50 MG tablet; TAKE ONE TABLET BY MOUTH EVERY 8 HOURS AS NEEDED FOR SEVERE PAIN. USE SPARINGLY.  Dispense: 30 tablet; Refill: 0 -  meloxicam (MOBIC) 7.5 MG tablet; Take 1 tablet (7.5 mg total) by mouth daily as needed for pain.  Dispense: 30 tablet; Refill: 2 - Drug Screen, Urine  3. Anxiety Encourage patient to continue follow-up with [facility]. - escitalopram (LEXAPRO) 20 MG tablet; Take 1 tablet (20 mg total) by mouth daily.  Dispense: 30 tablet; Refill: 3  4. History of panic attacks  - escitalopram (LEXAPRO) 20 MG tablet; Take 1 tablet (20 mg total) by mouth daily.  Dispense: 30 tablet; Refill: 3  5. Essential hypertension  - losartan (COZAAR) 25 MG tablet; Take 1 tablet (25 mg total) by mouth daily.  Dispense: 30 tablet; Refill: 2         Meds ordered this encounter  Medications  . traMADol (ULTRAM) 50 MG tablet    Sig: TAKE ONE TABLET BY MOUTH EVERY 8 HOURS AS NEEDED FOR SEVERE PAIN. USE SPARINGLY.    Dispense:  30 tablet    Refill:  0    Order Specific Question:   Supervising Provider    Answer:  JEGEDE, OLUGBEMIGA E [id number]  . meloxicam (MOBIC) 7.5 MG tablet    Sig: Take 1 tablet (7.5 mg total) by mouth daily as needed for pain.    Dispense:  30 tablet    Refill:  2    Order Specific Question:   Supervising Provider    Answer:   Tresa Garter [id number]  . escitalopram (LEXAPRO) 20 MG tablet    Sig: Take 1 tablet (20 mg total) by mouth daily.    Dispense:  30 tablet    Refill:  3    Order Specific Question:   Supervising Provider    Answer:   Tresa Garter [id number]  . losartan (COZAAR) 25 MG tablet    Sig: Take 1 tablet (25 mg total) by mouth daily.    Dispense:  30 tablet    Refill:  2    Order Specific Question:   Supervising Provider    Answer:   Tresa Garter [id number]  . Blood Glucose Monitoring Suppl (TRUE METRIX METER) w/Device KIT    Sig: 1 each by Does not apply route daily.    Dispense:  1 kit    Refill:  0    Order Specific Question:   Supervising Provider    Answer:   Tresa Garter [id number]    Follow-up: Return in about 3 months (around  12/26/2017) for DM/HTN/Anxiety.   Alfonse Spruce FNP

## 2017-09-25 NOTE — Progress Notes (Signed)
cbg  

## 2017-09-25 NOTE — Patient Instructions (Signed)
Type 2 Diabetes Mellitus, Self Care, Adult When you have type 2 diabetes (type 2 diabetes mellitus), you must keep your blood sugar (glucose) under control. You can do this with:  Nutrition.  Exercise.  Lifestyle changes.  Medicines or insulin, if needed.  Support from your doctors and others.  How do I manage my blood sugar?  Check your blood sugar level every day, as often as told.  Call your doctor if your blood sugar is above your goal numbers for 2 tests in a row.  Have your A1c (hemoglobin A1c) level checked at least two times a year. Have it checked more often if your doctor tells you to. Your doctor will set treatment goals for you. Generally, you should have these blood sugar levels:  Before meals (preprandial): 80-130 mg/dL (4.4-7.2 mmol/L).  After meals (postprandial): lower than 180 mg/dL (10 mmol/L).  A1c level: less than 7%.  What do I need to know about high blood sugar? High blood sugar is called hyperglycemia. Know the signs of high blood sugar. Signs may include:  Feeling: ? Thirsty. ? Hungry. ? Very tired.  Needing to pee (urinate) more than usual.  Blurry vision.  What do I need to know about low blood sugar? Low blood sugar is called hypoglycemia. This is when blood sugar is at or below 70 mg/dL (3.9 mmol/L). Symptoms may include:  Feeling: ? Hungry. ? Worried or nervous (anxious). ? Sweaty and clammy. ? Confused. ? Dizzy. ? Sleepy. ? Sick to your stomach (nauseous).  Having: ? A fast heartbeat (palpitations). ? A headache. ? A change in your vision. ? Jerky movements that you cannot control (seizure). ? Nightmares. ? Tingling or no feeling (numbness) around the mouth, lips, or tongue.  Having trouble with: ? Talking. ? Paying attention (concentrating). ? Moving (coordination). ? Sleeping.  Shaking.  Passing out (fainting).  Getting upset easily (irritability).  Treating low blood sugar  To treat low blood sugar, eat or  drink something sugary right away. If you can think clearly and swallow safely, follow the 15:15 rule:  Take 15 grams of a fast-acting carb (carbohydrate). Some fast-acting carbs are: ? 1 tube of glucose gel. ? 3 sugar tablets (glucose pills). ? 6-8 pieces of hard candy. ? 4 oz (120 mL) of fruit juice. ? 4 oz (120 mL) regular (not diet) soda.  Check your blood sugar 15 minutes after you take the carb.  If your blood sugar is still at or below 70 mg/dL (3.9 mmol/L), take 15 grams of a carb again.  If your blood sugar does not go above 70 mg/dL (3.9 mmol/L) after 3 tries, get help right away.  After your blood sugar goes back to normal, eat a meal or a snack within 1 hour.  Treating very low blood sugar If your blood sugar is at or below 54 mg/dL (3 mmol/L), you have very low blood sugar (severe hypoglycemia). This is an emergency. Do not wait to see if the symptoms will go away. Get medical help right away. Call your local emergency services (911 in the [place] Do not drive yourself to the hospital. If you have very low blood sugar and you cannot eat or drink, you may need a glucagon shot (injection). A family member or friend should learn how to check your blood sugar and how to give you a glucagon shot. Ask your doctor if you need to have a glucagon shot kit at home. What else is important to manage my diabetes? Medicine  Follow these instructions about insulin and diabetes medicines:  Take them as told by your doctor.  Adjust them as told by your doctor.  Do not run out of them.  Having diabetes can raise your risk for other long-term conditions. These include heart or kidney disease. Your doctor may prescribe medicines to help prevent problems from diabetes. Food   Make healthy food choices. These include: ? Chicken, fish, egg whites, and beans. ? Oats, whole wheat, bulgur, brown rice, quinoa, and millet. ? Fresh fruits and vegetables. ? Low-fat dairy products. ? Nuts,  avocado, olive oil, and canola oil.  Make a food plan with a specialist (dietitian).  Follow instructions from your doctor about what you cannot eat or drink.  Drink enough fluid to keep your pee (urine) clear or pale yellow.  Eat healthy snacks between healthy meals.  Keep track of carbs that you eat. Read food labels. Learn food serving sizes.  Follow your sick day plan when you cannot eat or drink normally. Make this plan with your doctor so it is ready to use. Activity  Exercise at least 3 times a week.  Do not go more than 2 days without exercising.  Talk with your doctor before you start a new exercise. Your doctor may need to adjust your insulin, medicines, or food. Lifestyle   Do not use any tobacco products. These include cigarettes, chewing tobacco, and e-cigarettes.If you need help quitting, ask your doctor.  Ask your doctor how much alcohol is safe for you.  Learn to deal with stress. If you need help with this, ask your doctor. Body care  Stay up to date with your shots (immunizations).  Have your eyes and feet checked by a doctor as often as told.  Check your skin and feet every day. Check for cuts, bruises, redness, blisters, or sores.  Brush your teeth and gums two times a day.  Floss at least one time a day.  Go to the dentist least one time every 6 months.  Stay at a healthy weight. General instructions   Take over-the-counter and prescription medicines only as told by your doctor.  Share your diabetes care plan with: ? Your work or school. ? People you live with.  Check your pee (urine) for ketones: ? When you are sick. ? As told by your doctor.  Carry a card or wear jewelry that says that you have diabetes.  Ask your doctor: ? Do I need to meet with a diabetes educator? ? Where can I find a support group for people with diabetes?  Keep all follow-up visits as told by your doctor. This is important. Where to find more information: To  learn more about diabetes, visit:  American Diabetes Association: www.diabetes.org  American Association of Diabetes Educators: www.diabeteseducator.org/patient-resources  This information is not intended to replace advice given to you by your health care provider. Make sure you discuss any questions you have with your health care provider. Document Released: 03/05/2016 Document Revised: 04/19/2016 Document Reviewed: 12/16/2015 Elsevier Interactive Patient Education  [employer].

## 2017-09-26 LAB — DRUG SCREEN, URINE
Amphetamines, Urine: NEGATIVE ng/mL
BARBITURATE SCREEN URINE: NEGATIVE ng/mL
Benzodiazepine Quant, Ur: NEGATIVE ng/mL
CANNABINOID QUANT UR: NEGATIVE ng/mL
COCAINE (METAB.): NEGATIVE ng/mL
Opiate Quant, Ur: NEGATIVE ng/mL
PCP QUANT UR: NEGATIVE ng/mL

## 2017-09-29 ENCOUNTER — Other Ambulatory Visit: Payer: Self-pay | Admitting: Family Medicine

## 2017-09-29 DIAGNOSIS — M25511 Pain in right shoulder: Principal | ICD-10-CM

## 2017-09-29 DIAGNOSIS — G8929 Other chronic pain: Secondary | ICD-10-CM

## 2017-10-04 ENCOUNTER — Encounter: Payer: Self-pay | Admitting: Family Medicine

## 2017-10-04 ENCOUNTER — Ambulatory Visit: Payer: Self-pay | Attending: Family Medicine | Admitting: Family Medicine

## 2017-10-04 VITALS — BP 149/83 | HR 73 | Temp 98.8°F | Resp 18 | Ht 67.0 in | Wt 204.4 lb

## 2017-10-04 DIAGNOSIS — G8929 Other chronic pain: Secondary | ICD-10-CM | POA: Insufficient documentation

## 2017-10-04 DIAGNOSIS — M542 Cervicalgia: Secondary | ICD-10-CM | POA: Insufficient documentation

## 2017-10-04 DIAGNOSIS — E119 Type 2 diabetes mellitus without complications: Secondary | ICD-10-CM | POA: Insufficient documentation

## 2017-10-04 DIAGNOSIS — M25511 Pain in right shoulder: Secondary | ICD-10-CM | POA: Insufficient documentation

## 2017-10-04 DIAGNOSIS — I10 Essential (primary) hypertension: Secondary | ICD-10-CM | POA: Insufficient documentation

## 2017-10-04 LAB — GLUCOSE, POCT (MANUAL RESULT ENTRY): POC Glucose: 157 mg/dL — AB (ref 70–99)

## 2017-10-04 MED ORDER — LOSARTAN POTASSIUM 50 MG PO TABS: 50.0000 mg | ORAL_TABLET | Freq: Every day | ORAL | 5 refills | Status: DC

## 2017-10-04 NOTE — Patient Instructions (Signed)
Diabetes Mellitus and Food It is important for you to manage your blood sugar (glucose) level. Your blood glucose level can be greatly affected by what you eat. Eating healthier foods in the appropriate amounts throughout the day at about the same time each day will help you control your blood glucose level. It can also help slow or prevent worsening of your diabetes mellitus. Healthy eating may even help you improve the level of your blood pressure and reach or maintain a healthy weight. General recommendations for healthful eating and cooking habits include:  Eating meals and snacks regularly. Avoid going long periods of time without eating to lose weight.  Eating a diet that consists mainly of plant-based foods, such as fruits, vegetables, nuts, legumes, and whole grains.  Using low-heat cooking methods, such as baking, instead of high-heat cooking methods, such as deep frying.  Work with your dietitian to make sure you understand how to use the Nutrition Facts information on food labels. How can food affect me? Carbohydrates Carbohydrates affect your blood glucose level more than any other type of food. Your dietitian will help you determine how many carbohydrates to eat at each meal and teach you how to count carbohydrates. Counting carbohydrates is important to keep your blood glucose at a healthy level, especially if you are using insulin or taking certain medicines for diabetes mellitus. Alcohol Alcohol can cause sudden decreases in blood glucose (hypoglycemia), especially if you use insulin or take certain medicines for diabetes mellitus. Hypoglycemia can be a life-threatening condition. Symptoms of hypoglycemia (sleepiness, dizziness, and disorientation) are similar to symptoms of having too much alcohol. If your health care provider has given you approval to drink alcohol, do so in moderation and use the following guidelines:  Women should not have more than one drink per day, and men  should not have more than two drinks per day. One drink is equal to: ? 12 oz of beer. ? 5 oz of wine. ? 1 oz of hard liquor.  Do not drink on an empty stomach.  Keep yourself hydrated. Have water, diet soda, or unsweetened iced tea.  Regular soda, juice, and other mixers might contain a lot of carbohydrates and should be counted.  What foods are not recommended? As you make food choices, it is important to remember that all foods are not the same. Some foods have fewer nutrients per serving than other foods, even though they might have the same number of calories or carbohydrates. It is difficult to get your body what it needs when you eat foods with fewer nutrients. Examples of foods that you should avoid that are high in calories and carbohydrates but low in nutrients include:  Trans fats (most processed foods list trans fats on the Nutrition Facts label).  Regular soda.  Juice.  Candy.  Sweets, such as cake, pie, doughnuts, and cookies.  Fried foods.  What foods can I eat? Eat nutrient-[place] foods, which will nourish your body and keep you healthy. The food you should eat also will depend on several factors, including:  The calories you need.  The medicines you take.  Your weight.  Your blood glucose level.  Your blood pressure level.  Your cholesterol level.  You should eat a variety of foods, including:  Protein. ? Lean cuts of meat. ? Proteins low in saturated fats, such as fish, egg whites, and beans. Avoid processed meats.  Fruits and vegetables. ? Fruits and vegetables that may help control blood glucose levels, such as apples,   mangoes, and yams.  Dairy products. ? Choose fat-free or low-fat dairy products, such as milk, yogurt, and cheese.  Grains, bread, pasta, and rice. ? Choose whole grain products, such as multigrain bread, whole oats, and brown rice. These foods may help control blood pressure.  Fats. ? Foods containing healthful fats, such as  nuts, avocado, olive oil, canola oil, and fish.  Does everyone with diabetes mellitus have the same meal plan? Because every person with diabetes mellitus is different, there is not one meal plan that works for everyone. It is very important that you meet with a dietitian who will help you create a meal plan that is just right for you. This information is not intended to replace advice given to you by your health care provider. Make sure you discuss any questions you have with your health care provider. Document Released: 08/09/2005 Document Revised: 04/19/2016 Document Reviewed: 10/09/2013 Elsevier Interactive Patient Education  2017 Elsevier Inc.  

## 2017-10-04 NOTE — Progress Notes (Deleted)
Subjective:  Patient ID: Adrian Schultz, male    DOB: 06/04/1966  Age: 51 y.o. MRN: [id number]  CC: No chief complaint on file.   HPI KHALIQ TURAY presents for ***      Outpatient Medications Prior to Visit  Medication Sig Dispense Refill  . acetaminophen (TYLENOL) 500 MG tablet Take 2 tablets (1,000 mg total) by mouth every 8 (eight) hours as needed for moderate pain. 30 tablet 1  . albuterol (PROVENTIL HFA;VENTOLIN HFA) 108 (90 Base) MCG/ACT inhaler Inhale 2 puffs into the lungs every 6 (six) hours as needed for wheezing or shortness of breath. 1 Inhaler 1  . Blood Glucose Monitoring Suppl (TRUE METRIX METER) w/Device KIT 1 each by Does not apply route daily. 1 kit 0  . escitalopram (LEXAPRO) 20 MG tablet Take 1 tablet (20 mg total) by mouth daily. 30 tablet 3  . glipiZIDE (GLUCOTROL) 10 MG tablet Take half a tablet by mouth daily before breakfast. 90 tablet 3  . glucose blood (TRUE METRIX BLOOD GLUCOSE TEST) test strip Use as instructed 100 each 12  . ibuprofen (ADVIL,MOTRIN) 600 MG tablet Take 1 tablet (600 mg total) by mouth every 6 (six) hours as needed for moderate pain or cramping (Take with food.). (Patient not taking: Reported on 09/06/2017) 30 tablet 1  . losartan (COZAAR) 25 MG tablet Take 1 tablet (25 mg total) by mouth daily. 30 tablet 2  . meloxicam (MOBIC) 7.5 MG tablet Take 1 tablet (7.5 mg total) by mouth daily as needed for pain. 30 tablet 2  . metFORMIN (GLUCOPHAGE XR) 500 MG 24 hr tablet Take 2 tablets (1,000 mg total) by mouth 2 (two) times daily with a meal. 120 tablet 3  . nicotine polacrilex (NICORETTE) 4 MG gum Take 1 each (4 mg total) by mouth as needed for smoking cessation. (Patient not taking: Reported on 09/06/2017) 100 tablet 1  . pravastatin (PRAVACHOL) 20 MG tablet Take 1 tablet (20 mg total) by mouth daily. (Patient not taking: Reported on 09/06/2017) 90 tablet 3  . sildenafil (VIAGRA) 25 MG tablet Take 1-2 tablets (25-50 mg total) by mouth daily as  needed for erectile dysfunction. 10 tablet 11  . traMADol (ULTRAM) 50 MG tablet TAKE ONE TABLET BY MOUTH EVERY 8 HOURS AS NEEDED FOR SEVERE PAIN. USE SPARINGLY. 30 tablet 0  . TRUEPLUS LANCETS 28G MISC 1 each by Does not apply route daily. 100 each 11   Facility-Administered Medications Prior to Visit  Medication Dose Route Frequency Provider Last Rate Last Dose  . ipratropium-albuterol (DUONEB) 0.5-2.5 (3) MG/3ML nebulizer solution 3 mL  3 mL Nebulization Q20 Min PRN Fredia Beets R, FNP   3 mL at 05/16/17 1500    ROS Review of Systems  Review of Systems - {ros [id number]}    Objective:  BP (!) 145/87 (BP Location: Left Arm, Patient Position: Sitting, Cuff Size: Normal)   Pulse 73   Temp 98.8 F (37.1 C) (Oral)   Resp 18   Ht 5' 7" (1.702 m)   Wt 204 lb 6.4 oz (92.7 kg)   BMI 32.01 kg/m   BP/Weight 10/04/2017 09/25/2017 67/20/9470  Systolic BP 962 836 629  Diastolic BP 87 81 80  Wt. (Lbs) 204.4 195.2 -  BMI 32.01 30.57 -     Physical Exam   Assessment & Plan:   Problem List Items Addressed This Visit      Endocrine   Type 2 diabetes mellitus without complication, without long-term current use of insulin (  [place]) - Primary   Relevant Orders   Glucose (CBG) (Completed)      No orders of the defined types were placed in this encounter.   Follow-up: No Follow-up on file.   Alfonse Spruce FNP

## 2017-10-09 ENCOUNTER — Telehealth (HOSPITAL_COMMUNITY): Payer: Self-pay

## 2017-10-09 ENCOUNTER — Ambulatory Visit (HOSPITAL_COMMUNITY): Payer: Self-pay

## 2017-10-13 NOTE — Progress Notes (Signed)
Subjective:  Patient ID: Adrian Schultz, male    DOB: 09/24/1966  Age: 51 y.o. MRN: [id number]  CC: Follow-up and Diabetes  HPI Adrian Schultz presents for follow up. History of  chronic right shoulder pain which has been present for several months. Pain is located in the right shoulder(s), is described as aching, and is moderate. Associated symptoms include: decreased ROM, burning pain, and right hand strength. Reports taking ibuprofen for symptoms.  He reports not filling his prescription for tramadol yet.  Previous work up included: xray. Related to injury: no.  Posterior neck pain : Onset 3 weeks ago.  Associated symptoms-crepitus (grinding, popping).  He does report recent history of heavy lifting.  He denies any paresthesias.  Diabetes Mellitus: Patient presents for follow up of diabetes. Symptoms: none.  Patient denies foot ulcerations, paresthesia of the feet, visual disturbances, vomitting and weight loss.  Evaluation to date has been included: fasting blood sugar, fasting lipid panel and hemoglobin A1C.  Home sugars: patient does not consistentl check CBG's.    Outpatient Medications Prior to Visit  Medication Sig Dispense Refill  . acetaminophen (TYLENOL) 500 MG tablet Take 2 tablets (1,000 mg total) by mouth every 8 (eight) hours as needed for moderate pain. 30 tablet 1  . albuterol (PROVENTIL HFA;VENTOLIN HFA) 108 (90 Base) MCG/ACT inhaler Inhale 2 puffs into the lungs every 6 (six) hours as needed for wheezing or shortness of breath. 1 Inhaler 1  . Blood Glucose Monitoring Suppl (TRUE METRIX METER) w/Device KIT 1 each by Does not apply route daily. 1 kit 0  . escitalopram (LEXAPRO) 20 MG tablet Take 1 tablet (20 mg total) by mouth daily. 30 tablet 3  . glipiZIDE (GLUCOTROL) 10 MG tablet Take half a tablet by mouth daily before breakfast. 90 tablet 3  . glucose blood (TRUE METRIX BLOOD GLUCOSE TEST) test strip Use as instructed 100 each 12  . ibuprofen (ADVIL,MOTRIN) 600 MG tablet Take 1  tablet (600 mg total) by mouth every 6 (six) hours as needed for moderate pain or cramping (Take with food.). (Patient not taking: Reported on 09/06/2017) 30 tablet 1  . meloxicam (MOBIC) 7.5 MG tablet Take 1 tablet (7.5 mg total) by mouth daily as needed for pain. 30 tablet 2  . metFORMIN (GLUCOPHAGE XR) 500 MG 24 hr tablet Take 2 tablets (1,000 mg total) by mouth 2 (two) times daily with a meal. 120 tablet 3  . nicotine polacrilex (NICORETTE) 4 MG gum Take 1 each (4 mg total) by mouth as needed for smoking cessation. (Patient not taking: Reported on 09/06/2017) 100 tablet 1  . pravastatin (PRAVACHOL) 20 MG tablet Take 1 tablet (20 mg total) by mouth daily. (Patient not taking: Reported on 09/06/2017) 90 tablet 3  . sildenafil (VIAGRA) 25 MG tablet Take 1-2 tablets (25-50 mg total) by mouth daily as needed for erectile dysfunction. 10 tablet 11  . traMADol (ULTRAM) 50 MG tablet TAKE ONE TABLET BY MOUTH EVERY 8 HOURS AS NEEDED FOR SEVERE PAIN. USE SPARINGLY. 30 tablet 0  . TRUEPLUS LANCETS 28G MISC 1 each by Does not apply route daily. 100 each 11  . losartan (COZAAR) 25 MG tablet Take 1 tablet (25 mg total) by mouth daily. 30 tablet 2   Facility-Administered Medications Prior to Visit  Medication Dose Route Frequency Provider Last Rate Last Dose  . ipratropium-albuterol (DUONEB) 0.5-2.5 (3) MG/3ML nebulizer solution 3 mL  3 mL Nebulization Q20 Min PRN Jvon Meroney R, FNP   3 mL  at 05/16/17 1500    ROS Review of Systems  Constitutional: Negative.   Eyes: Negative.   Respiratory: Negative.   Cardiovascular: Negative.   Gastrointestinal: Negative.   Musculoskeletal: Positive for arthralgias.  Skin: Negative.   Neurological: Negative.   Psychiatric/Behavioral: The patient is nervous/anxious.    Objective:  BP (!) 149/83   Pulse 73   Temp 98.8 F (37.1 C) (Oral)   Resp 18   Ht _0  (1.702 m)   Wt 204 lb 6.4 oz (92.7 kg)   BMI 32.01 kg/m   BP/Weight 10/04/2017 09/25/2017  57/32/2025  Systolic BP 427 062 376  Diastolic BP 83 81 80  Wt. (Lbs) 204.4 195.2 -  BMI 32.01 30.57 -     Physical Exam  Constitutional: He appears well-developed and well-nourished.  Cardiovascular: Normal rate, regular rhythm, normal heart sounds and intact distal pulses.   Pulmonary/Chest: Effort normal and breath sounds normal.  Abdominal: Soft. Bowel sounds are normal.  Musculoskeletal:       Right shoulder: He exhibits pain.  Skin: Skin is warm and dry.  Psychiatric: His mood appears anxious. He expresses no homicidal and no suicidal ideation. He expresses no suicidal plans and no homicidal plans.  Nursing note and vitals reviewed.   Assessment & Plan:   1. Chronic neck pain  - DG Cervical Spine Complete; Future  2. Type 2 diabetes mellitus without complication, without long-term current use of insulin ([facility])  - Glucose (CBG)  3. Essential hypertension Dose of losartan increased. Schedule BP recheck in 1 week with nurse. Follow-up with PCP in 3 months. - losartan (COZAAR) 50 MG tablet; Take 1 tablet (50 mg total) daily by mouth.  Dispense: 30 tablet; Refill: 5        Follow-up: Return in about 1 week (around 10/11/2017) for BP check with [place].   Alfonse Spruce FNP

## 2017-10-16 ENCOUNTER — Ambulatory Visit (HOSPITAL_COMMUNITY)
Admission: RE | Admit: 2017-10-16 | Discharge: 2017-10-16 | Disposition: A | Discharge status: Home / Self Care | Payer: Self-pay | Source: Ambulatory Visit | Attending: Family Medicine | Admitting: Family Medicine

## 2017-10-16 DIAGNOSIS — G8929 Other chronic pain: Secondary | ICD-10-CM | POA: Insufficient documentation

## 2017-10-16 DIAGNOSIS — M47812 Spondylosis without myelopathy or radiculopathy, cervical region: Secondary | ICD-10-CM | POA: Insufficient documentation

## 2017-10-16 DIAGNOSIS — M542 Cervicalgia: Secondary | ICD-10-CM | POA: Insufficient documentation

## 2017-10-16 DIAGNOSIS — M4184 Other forms of scoliosis, thoracic region: Secondary | ICD-10-CM | POA: Insufficient documentation

## 2017-10-22 ENCOUNTER — Telehealth: Payer: Self-pay

## 2017-10-22 NOTE — Telephone Encounter (Signed)
-----   Message from Lizbeth BarkMandesia R Hairston, FNP sent at 10/22/2017  8:57 AM EST ----- UDS (urine drug screen) negative. HgbA1c is  7.5. . HgbA1c gives an average of your blood sugar levels over the last 3 months. Goal is 7 or less. Continue to take your diabetic medications. Avoid eating large amounts of starches, white bread, rice, and sugar. -Keep a log of your blood sugar levels to bring to your next office visit.

## 2017-10-22 NOTE — Telephone Encounter (Signed)
CMA call regarding lab results   Patient Verify DOB   Patient was aware and understood  

## 2017-10-24 ENCOUNTER — Ambulatory Visit: Payer: Self-pay

## 2017-10-25 ENCOUNTER — Other Ambulatory Visit: Payer: Self-pay | Admitting: *Deleted

## 2017-10-25 ENCOUNTER — Ambulatory Visit: Payer: Self-pay | Attending: Family Medicine | Admitting: *Deleted

## 2017-10-25 ENCOUNTER — Other Ambulatory Visit: Payer: Self-pay | Admitting: Family Medicine

## 2017-10-25 VITALS — BP 120/70 | HR 75 | Resp 16

## 2017-10-25 DIAGNOSIS — I10 Essential (primary) hypertension: Secondary | ICD-10-CM

## 2017-10-25 DIAGNOSIS — Z0130 Encounter for examination of blood pressure without abnormal findings: Secondary | ICD-10-CM | POA: Insufficient documentation

## 2017-10-25 NOTE — Telephone Encounter (Signed)
Pt came in for nurse visit: BP check.  He request refill on Tramadol.

## 2017-10-25 NOTE — Progress Notes (Signed)
[facility] and oriented and arrives in good spirits. Pt denies chest pain, SOB, HA, dizziness, or blurred vision.  Verified medication. Pt states medication was taken this morning.  Manual blood pressure reading: 120/70

## 2017-10-28 ENCOUNTER — Other Ambulatory Visit: Payer: Self-pay | Admitting: Family Medicine

## 2017-10-28 DIAGNOSIS — G8929 Other chronic pain: Secondary | ICD-10-CM

## 2017-10-28 DIAGNOSIS — M542 Cervicalgia: Secondary | ICD-10-CM

## 2017-10-28 DIAGNOSIS — M25511 Pain in right shoulder: Principal | ICD-10-CM

## 2017-10-28 MED ORDER — TRAMADOL HCL 50 MG PO TABS: ORAL_TABLET | ORAL | 0 refills | Status: DC

## 2017-10-28 NOTE — Telephone Encounter (Signed)
Script available for pick up.  

## 2017-10-28 NOTE — Telephone Encounter (Signed)
  Notes recorded by Lizbeth BarkHairston, Mandesia R, FNP on 10/25/2017 at 1:32 PM EST Slight osteoarthritic changes and mild s-curvature of the spinal column . No fracture, or disc compression.   Pt aware of message of XR, he requested refill on medication: Tramadol.

## 2017-10-29 MED FILL — MELOXICAM 7.5 MG TABLET: 7.5 | 30 days supply | Qty: 30 | Fill #0

## 2017-10-29 MED FILL — TRUE METRIX GLUCOSE TEST ST: 30 days supply | Qty: 100 | Fill #1

## 2017-10-29 MED FILL — LOSARTAN POTASSIUM 25 MG TA: 25 | 30 days supply | Qty: 30 | Fill #1

## 2017-10-29 MED FILL — TRUEplus LANCETS 28G MISC: 30 days supply | Qty: 100 | Fill #1

## 2017-10-29 MED FILL — ESCITALOPRAM 20 MG TABLET: 20 | 30 days supply | Qty: 30 | Fill #1

## 2017-10-29 NOTE — Telephone Encounter (Signed)
CMA call regarding x ray results & letting him know that his RX is ready for pick up at front desk   Patient Verify DOB   Patient was aware and understood

## 2017-10-29 NOTE — Telephone Encounter (Signed)
-----   Message from Lizbeth BarkMandesia R Hairston, FNP sent at 10/25/2017  1:32 PM EST ----- Slight osteoarthritic changes and mild s-curvature of the spinal column . No fracture, or disc compression.

## 2017-11-06 ENCOUNTER — Ambulatory Visit: Payer: Self-pay | Admitting: Family Medicine

## 2017-12-04 ENCOUNTER — Other Ambulatory Visit: Payer: Self-pay | Admitting: Family Medicine

## 2017-12-04 DIAGNOSIS — I10 Essential (primary) hypertension: Secondary | ICD-10-CM

## 2017-12-04 DIAGNOSIS — M542 Cervicalgia: Secondary | ICD-10-CM

## 2017-12-04 DIAGNOSIS — M25511 Pain in right shoulder: Secondary | ICD-10-CM

## 2017-12-04 DIAGNOSIS — G8929 Other chronic pain: Secondary | ICD-10-CM

## 2017-12-04 MED FILL — LOSARTAN POTASSIUM 25 MG TA: 25 | 30 days supply | Qty: 30 | Fill #2

## 2017-12-04 MED FILL — MELOXICAM 7.5 MG TABLET: 7.5 | 30 days supply | Qty: 30 | Fill #1

## 2017-12-04 MED FILL — TRUE METRIX TEST STRIP: 30 days supply | Qty: 100 | Fill #4

## 2017-12-04 MED FILL — ESCITALOPRAM 20 MG TABLET: 20 | 30 days supply | Qty: 30 | Fill #2

## 2017-12-04 MED FILL — METFORMIN HCL ER 500 MG TAB: 500 | 30 days supply | Qty: 120 | Fill #1

## 2017-12-18 ENCOUNTER — Ambulatory Visit: Payer: Self-pay | Admitting: Family Medicine

## 2017-12-19 ENCOUNTER — Ambulatory Visit: Payer: Self-pay | Admitting: Family Medicine

## 2017-12-24 MED FILL — TRUEplus LANCETS 28G MISC: 30 days supply | Qty: 100 | Fill #2

## 2017-12-27 ENCOUNTER — Encounter (INDEPENDENT_AMBULATORY_CARE_PROVIDER_SITE_OTHER): Payer: Self-pay | Admitting: Physician Assistant

## 2017-12-27 ENCOUNTER — Encounter: Payer: Self-pay | Admitting: Family Medicine

## 2017-12-27 ENCOUNTER — Ambulatory Visit (INDEPENDENT_AMBULATORY_CARE_PROVIDER_SITE_OTHER): Payer: PRIVATE HEALTH INSURANCE

## 2017-12-27 ENCOUNTER — Ambulatory Visit: Payer: Self-pay | Attending: Family Medicine | Admitting: Family Medicine

## 2017-12-27 ENCOUNTER — Ambulatory Visit (INDEPENDENT_AMBULATORY_CARE_PROVIDER_SITE_OTHER): Payer: PRIVATE HEALTH INSURANCE | Admitting: Physician Assistant

## 2017-12-27 VITALS — BP 145/92 | HR 75 | Temp 98.1°F | Resp 16 | Wt 208.2 lb

## 2017-12-27 DIAGNOSIS — M25511 Pain in right shoulder: Secondary | ICD-10-CM

## 2017-12-27 DIAGNOSIS — E119 Type 2 diabetes mellitus without complications: Secondary | ICD-10-CM

## 2017-12-27 DIAGNOSIS — F419 Anxiety disorder, unspecified: Secondary | ICD-10-CM

## 2017-12-27 DIAGNOSIS — M542 Cervicalgia: Secondary | ICD-10-CM

## 2017-12-27 DIAGNOSIS — M1712 Unilateral primary osteoarthritis, left knee: Secondary | ICD-10-CM

## 2017-12-27 DIAGNOSIS — M7581 Other shoulder lesions, right shoulder: Secondary | ICD-10-CM

## 2017-12-27 DIAGNOSIS — Z7984 Long term (current) use of oral hypoglycemic drugs: Secondary | ICD-10-CM | POA: Insufficient documentation

## 2017-12-27 DIAGNOSIS — Z8659 Personal history of other mental and behavioral disorders: Secondary | ICD-10-CM | POA: Insufficient documentation

## 2017-12-27 DIAGNOSIS — Z79899 Other long term (current) drug therapy: Secondary | ICD-10-CM | POA: Insufficient documentation

## 2017-12-27 DIAGNOSIS — Z79891 Long term (current) use of opiate analgesic: Secondary | ICD-10-CM | POA: Insufficient documentation

## 2017-12-27 DIAGNOSIS — F1021 Alcohol dependence, in remission: Secondary | ICD-10-CM | POA: Insufficient documentation

## 2017-12-27 DIAGNOSIS — G8929 Other chronic pain: Secondary | ICD-10-CM

## 2017-12-27 DIAGNOSIS — I10 Essential (primary) hypertension: Secondary | ICD-10-CM

## 2017-12-27 DIAGNOSIS — E1165 Type 2 diabetes mellitus with hyperglycemia: Secondary | ICD-10-CM

## 2017-12-27 DIAGNOSIS — Z791 Long term (current) use of non-steroidal anti-inflammatories (NSAID): Secondary | ICD-10-CM | POA: Insufficient documentation

## 2017-12-27 LAB — POCT GLYCOSYLATED HEMOGLOBIN (HGB A1C): Hemoglobin A1C: 9.9

## 2017-12-27 LAB — GLUCOSE, POCT (MANUAL RESULT ENTRY): POC Glucose: 407 mg/dl — AB (ref 70–99)

## 2017-12-27 MED ORDER — GABAPENTIN 300 MG PO CAPS: 300.0000 mg | ORAL_CAPSULE | Freq: Every day | ORAL | 2 refills | Status: DC

## 2017-12-27 MED ORDER — LIDOCAINE HCL 1 % IJ SOLN
2.0000 mL | INTRAMUSCULAR | Status: AC | PRN
  Administered 2017-12-27: 2 mL

## 2017-12-27 MED ORDER — BUPIVACAINE HCL 0.25 % IJ SOLN
2.0000 mL | INTRAMUSCULAR | Status: AC | PRN
  Administered 2017-12-27: 2 mL via INTRA_ARTICULAR

## 2017-12-27 MED ORDER — METFORMIN HCL 1000 MG PO TABS
1000.0000 mg | ORAL_TABLET | Freq: Two times a day (BID) | ORAL | 2 refills | Status: DC
Start: 2017-12-27 — End: 2018-12-05

## 2017-12-27 MED ORDER — METHYLPREDNISOLONE ACETATE 40 MG/ML IJ SUSP
40.0000 mg | INTRAMUSCULAR | Status: AC | PRN
  Administered 2017-12-27: 40 mg via INTRA_ARTICULAR

## 2017-12-27 MED ORDER — ESCITALOPRAM OXALATE 20 MG PO TABS: 20.0000 mg | ORAL_TABLET | Freq: Every day | ORAL | 3 refills | Status: DC

## 2017-12-27 MED ORDER — MELOXICAM 7.5 MG PO TABS: 7.5000 mg | ORAL_TABLET | Freq: Every day | ORAL | 2 refills | Status: DC | PRN

## 2017-12-27 MED ORDER — GLUCOSE BLOOD VI STRP: ORAL_STRIP | 12 refills | Status: DC

## 2017-12-27 MED FILL — GABAPENTIN 300 MG CAPSULE: 300 | 30 days supply | Qty: 30 | Fill #0

## 2017-12-27 NOTE — Progress Notes (Signed)
Pt is requesting a refill on test strips and lancets

## 2017-12-27 NOTE — Progress Notes (Signed)
Office Visit Note   Patient: Adrian Schultz           Date of Birth: 1966/08/21           MRN: [id number] Visit Date: 12/27/2017              Requested by: Adrian Schultz, Adrian R, Schultz [address] [place], [place] [id number] PCP: Adrian Schultz, Adrian R, Schultz   Assessment & Plan: Visit Diagnoses:  1. Primary localized osteoarthritis of left knee   2. Neck pain   3. Rotator cuff tendinitis, right     Plan: Adrian SorrowJerry has a multitude of issues going on here.  The most pressing of these today is his left knee.  We will proceed with a left knee cortisone injection today.  He will follow-up with us next Friday for a right shoulder cortisone injection.  I have told him we could not do both today as he is diabetic.  He will paced special attention to his CBGs over the next few days.  Follow-Up Instructions: Return in about 7 days (around 01/03/2018) for right shoulder injection.   Orders:  Orders Placed This Encounter  Procedures  . Large Joint Inj: L knee  . XR Cervical Spine 2 or 3 views  . XR Knee Complete 4 Views Left  . XR Shoulder Right   No orders of the defined types were placed in this encounter.     Procedures: Large Joint Inj: L knee on 12/27/2017 2:03 PM Indications: pain Details: 22 G needle, anterolateral approach Medications: 2 mL lidocaine 1 %; 2 mL bupivacaine 0.25 %; 40 mg methylPREDNISolone acetate 40 MG/ML      Clinical Data: No additional findings.   Subjective: Chief Complaint  Patient presents with  . Right Shoulder - Pain  . Lower Back - Pain  . Left Knee - Pain  . Neck - Pain    HPI Adrian SorrowJerry is a pleasant 52 year old new patient.  He presents our clinic today with right shoulder pain neck pain and left knee pain.  The neck pain is over the spine and paraspinous areas radiating down the lateral aspect of the neck into the top of the shoulder.  He does occasionally get some parascapular trigger point.  No known injury or change in activity.  He has tried  tramadol and Mobic with minimal relief of symptoms.  Pain is worse with any motion of the neck as well as with forward flexion and internal rotation of the right shoulder.  No radicular symptoms noted.  In regards to the right knee this is been bothering him since 1980s when when his calf was taken off with Tiller.  He also states that he believes he broke his kneecap which was further operated on by Dr. Fannie Schultz.  He has had pain since.  All of his pain is to the anterolateral aspect.  He describes this is a constant toothache.  No mechanical symptoms or instability.  He does not marked pain going down stairs and hills.  He has not had a cortisone injection here.  He does mention that he has had foot drop on the left since the injury with patellar 1987.  Review of Systems as detailed in HPI.  All others reviewed and are negative.   Objective: Vital Signs: There were no vitals taken for this visit.  Physical Exam well-developed well-nourished gentleman in no acute distress.  Alert and oriented x3.  Ortho Exam examination of his right shoulder reveals 75% active range of  motion.  Markedly positive empty can and cross body.  Limited range of motion with the cervical spine secondary to pain.  Left knee has no effusion.  Range of motion 0-130 degrees.  Moderate patellofemoral crepitus.  Marked tenderness over the patella tendon as well as the lateral joint line.  Ligaments are stable.  EHL FHL not intact.   Specialty Comments:  No specialty comments available.  Imaging: Xr Knee Complete 4 Views Left  Result Date: 12/27/2017 X-rays of the knee reveal moderate medial compartment osteoarthritis.  Xr Cervical Spine 2 Or 3 Views  Result Date: 12/27/2017 X-rays of the cervical spine reveal abnormal straightening.  Otherwise no acute findings.  Xr Shoulder Right  Result Date: 12/27/2017 X-rays of the shoulder reveal a type II acromion.  Moderate AC degenerative changes.  Occasional humeral space.  No  superior migration of the humeral head.    PMFS History: Patient Active Problem List   Diagnosis Date Noted  . Rotator cuff tendinitis, right 12/27/2017  . Primary localized osteoarthritis of left knee 12/27/2017  . Neck pain 12/27/2017  . Essential hypertension 10/04/2017  . Alcohol abuse with alcohol-induced mood disorder (HCC) 09/06/2017  . Hypercholesteremia 07/04/2017  . Bronchiectasis (HCC) 06/17/2017  . Type 2 diabetes mellitus without complication, without long-term current use of insulin (HCC) 04/05/2017  . Intoxication 07/09/2013   Past Medical History:  Diagnosis Date  . Diabetes mellitus without complication (HCC)   . ETOH abuse   . Hypercholesteremia   . Hypertension   . Suicidal ideation     History reviewed. No pertinent family history.  Past Surgical History:  Procedure Laterality Date  . FOOT SURGERY    . KNEE SURGERY     Social History   Occupational History  . Occupation: Unemployeed  Tobacco Use  . Smoking status: Never Smoker  . Smokeless tobacco: Current User    Types: Snuff  . Tobacco comment: daily since 52 years old  Substance and Sexual Activity  . Alcohol use: Yes    Alcohol/week: 0.0 oz    Comment: beer - daily 1/2 a case   . Drug use: No  . Sexual activity: Not Currently

## 2017-12-27 NOTE — Patient Instructions (Signed)
Diabetes Mellitus and Nutrition When you have diabetes (diabetes mellitus), it is very important to have healthy eating habits because your blood sugar (glucose) levels are greatly affected by what you eat and drink. Eating healthy foods in the appropriate amounts, at about the same times every day, can help you:  Control your blood glucose.  Lower your risk of heart disease.  Improve your blood pressure.  Reach or maintain a healthy weight.  Every person with diabetes is different, and each person has different needs for a meal plan. Your health care provider may recommend that you work with a diet and nutrition specialist (dietitian) to make a meal plan that is best for you. Your meal plan may vary depending on factors such as:  The calories you need.  The medicines you take.  Your weight.  Your blood glucose, blood pressure, and cholesterol levels.  Your activity level.  Other health conditions you have, such as heart or kidney disease.  How do carbohydrates affect me? Carbohydrates affect your blood glucose level more than any other type of food. Eating carbohydrates naturally increases the amount of glucose in your blood. Carbohydrate counting is a method for keeping track of how many carbohydrates you eat. Counting carbohydrates is important to keep your blood glucose at a healthy level, especially if you use insulin or take certain oral diabetes medicines. It is important to know how many carbohydrates you can safely have in each meal. This is different for every person. Your dietitian can help you calculate how many carbohydrates you should have at each meal and for snack. Foods that contain carbohydrates include:  Bread, cereal, rice, pasta, and crackers.  Potatoes and corn.  Peas, beans, and lentils.  Milk and yogurt.  Fruit and juice.  Desserts, such as cakes, cookies, ice cream, and candy.  How does alcohol affect me? Alcohol can cause a sudden decrease in blood  glucose (hypoglycemia), especially if you use insulin or take certain oral diabetes medicines. Hypoglycemia can be a life-threatening condition. Symptoms of hypoglycemia (sleepiness, dizziness, and confusion) are similar to symptoms of having too much alcohol. If your health care provider says that alcohol is safe for you, follow these guidelines:  Limit alcohol intake to no more than 1 drink per day for nonpregnant women and 2 drinks per day for men. One drink equals 12 oz of beer, 5 oz of wine, or 1 oz of hard liquor.  Do not drink on an empty stomach.  Keep yourself hydrated with water, diet soda, or unsweetened iced tea.  Keep in mind that regular soda, juice, and other mixers may contain a lot of sugar and must be counted as carbohydrates.  What are tips for following this plan? Reading food labels  Start by checking the serving size on the label. The amount of calories, carbohydrates, fats, and other nutrients listed on the label are based on one serving of the food. Many foods contain more than one serving per package.  Check the total grams (g) of carbohydrates in one serving. You can calculate the number of servings of carbohydrates in one serving by dividing the total carbohydrates by 15. For example, if a food has 30 g of total carbohydrates, it would be equal to 2 servings of carbohydrates.  Check the number of grams (g) of saturated and trans fats in one serving. Choose foods that have low or no amount of these fats.  Check the number of milligrams (mg) of sodium in one serving. Most people   should limit total sodium intake to less than 2,300 mg per day.  Always check the nutrition information of foods labeled as "low-fat" or "nonfat". These foods may be higher in added sugar or refined carbohydrates and should be avoided.  Talk to your dietitian to identify your daily goals for nutrients listed on the label. Shopping  Avoid buying canned, premade, or processed foods. These  foods tend to be high in fat, sodium, and added sugar.  Shop around the outside edge of the grocery store. This includes fresh fruits and vegetables, bulk grains, fresh meats, and fresh dairy. Cooking  Use low-heat cooking methods, such as baking, instead of high-heat cooking methods like deep frying.  Cook using healthy oils, such as olive, canola, or sunflower oil.  Avoid cooking with butter, cream, or high-fat meats. Meal planning  Eat meals and snacks regularly, preferably at the same times every day. Avoid going long periods of time without eating.  Eat foods high in fiber, such as fresh fruits, vegetables, beans, and whole grains. Talk to your dietitian about how many servings of carbohydrates you can eat at each meal.  Eat 4-6 ounces of lean protein each day, such as lean meat, chicken, fish, eggs, or tofu. 1 ounce is equal to 1 ounce of meat, chicken, or fish, 1 egg, or 1/4 cup of tofu.  Eat some foods each day that contain healthy fats, such as avocado, nuts, seeds, and fish. Lifestyle   Check your blood glucose regularly.  Exercise at least 30 minutes 5 or more days each week, or as told by your health care provider.  Take medicines as told by your health care provider.  Do not use any products that contain nicotine or tobacco, such as cigarettes and e-cigarettes. If you need help quitting, ask your health care provider.  Work with a counselor or diabetes educator to identify strategies to manage stress and any emotional and social challenges. What are some questions to ask my health care provider?  Do I need to meet with a diabetes educator?  Do I need to meet with a dietitian?  What number can I call if I have questions?  When are the best times to check my blood glucose? Where to find more information:  American Diabetes Association: diabetes.org/food-and-fitness/food  Academy of Nutrition and Dietetics:  www.eatright.org/resources/health/diseases-and-conditions/diabetes  National Institute of Diabetes and Digestive and Kidney Diseases (NIH): www.niddk.nih.gov/health-information/diabetes/overview/diet-eating-physical-activity Summary  A healthy meal plan will help you control your blood glucose and maintain a healthy lifestyle.  Working with a diet and nutrition specialist (dietitian) can help you make a meal plan that is best for you.  Keep in mind that carbohydrates and alcohol have immediate effects on your blood glucose levels. It is important to count carbohydrates and to use alcohol carefully. This information is not intended to replace advice given to you by your health care provider. Make sure you discuss any questions you have with your health care provider. Document Released: 08/09/2005 Document Revised: 12/17/2016 Document Reviewed: 12/17/2016 Elsevier Interactive Patient Education  2018 Elsevier Inc.  

## 2017-12-27 NOTE — Progress Notes (Signed)
Subjective:  Patient ID: Adrian Schultz, male    DOB: [birth year]  Age: 52 y.o. MRN: [id number]  CC: Follow-up and Referral (pain management)   HPI Adrian Schultz presents for presents for follow up. History of  chronic right shoulder pain which has been present for over several months. Pain is located in the right shoulder(s), is described as aching, and is moderate. Associated symptoms include: decreased ROM, burning pain, and right hand strength. Reports taking ibuprofen for symptoms.  He reports not filling his prescription for tramadol yet and bring script with him to office visit.  Previous work up included: xray and referrals. Related to injury: no.  Posterior neck pain : Onset 3 weeks ago.  Associated symptoms-crepitus (grinding, popping).  He does report recent history of heavy lifting.  He denies any paresthesias. He reports upcoming appointment with orthopedics next week. History of anxiety disorder. Onset of symptoms was approximately patient reports since his mid 4's He denies current suicidal and homicidal ideation. Possible organic causes contributing are: history of ETOH abuse. Risk factors: previous episode of depression and homelesssness Previous treatment includes: Lexapro. He complains of the following side effects from the treatment: none. Diabetes Mellitus: Patient presents for follow up of diabetes. Symptoms:paresthesia of the feet, intermittment. Onset 5 to 6 year ago. Patient denies foot ulcerations, visual disturbances, vomiting and weight loss. Evaluation to date has been included: fasting blood sugar, fasting lipid panel and hemoglobin A1C.  Home sugars: patient does not consistently check CBG's. History of hypertension. He did not take BP medication prior to office visit.   He is not exercising and is not adherent to low salt diet. He does not check BP at home. Cardiac symptoms none. Patient denies chest pain, chest pressure/discomfort, claudication and dyspnea.  Cardiovascular  risk factors: diabetes mellitus, hypertension, male gender, sedentary lifestyle and smoking/ tobacco exposure. Use of agents associated with hypertension: none. History of target organ damage: none.    Outpatient Medications Prior to Visit  Medication Sig Dispense Refill  . Blood Glucose Monitoring Suppl (TRUE METRIX METER) w/Device KIT 1 each by Does not apply route daily. 1 kit 0  . losartan (COZAAR) 50 MG tablet Take 1 tablet (50 mg total) daily by mouth. 30 tablet 5  . traMADol (ULTRAM) 50 MG tablet TAKE 1 TABLET BY MOUTH EVERY 8 HOURS AS NEEDED FOR SEVERE PAIN. USE SPARINGLY. 30 tablet 0  . escitalopram (LEXAPRO) 20 MG tablet Take 1 tablet (20 mg total) by mouth daily. 30 tablet 3  . glucose blood (TRUE METRIX BLOOD GLUCOSE TEST) test strip Use as instructed 100 each 12  . meloxicam (MOBIC) 7.5 MG tablet Take 1 tablet (7.5 mg total) by mouth daily as needed for pain. 30 tablet 2  . metFORMIN (GLUCOPHAGE) 500 MG tablet Take by mouth 2 (two) times daily with a meal.     No facility-administered medications prior to visit.     ROS Review of Systems  Constitutional: Negative.   Respiratory: Negative.   Cardiovascular: Negative.   Musculoskeletal: Positive for arthralgias.  Neurological:       Feet paresthesias   Psychiatric/Behavioral: The patient is nervous/anxious.    Objective:  BP (!) 145/92 (BP Location: Left Arm, Cuff Size: Small)   Pulse 75   Temp 98.1 F (36.7 C) (Oral)   Resp 16   Wt 208 lb 3.2 oz (94.4 kg)   SpO2 95%   BMI 32.61 kg/m   BP/Weight 12/27/2017 10/25/2017 32/04/7123  Systolic BP 580 998  065  Diastolic BP 92 70 83  Wt. (Lbs) 208.2 - 204.4  BMI 32.61 - 32.01     Physical Exam  Constitutional: He appears well-developed and well-nourished.  Eyes: Conjunctivae are normal. Pupils are equal, round, and reactive to light.  Cardiovascular: Normal rate, regular rhythm, normal heart sounds and intact distal pulses.  Pulmonary/Chest: Effort normal and breath  sounds normal.  Musculoskeletal:       Right shoulder: He exhibits pain.  Neurological: No sensory deficit.  Skin: Skin is warm and dry.  Psychiatric: He has a normal mood and affect. He expresses no homicidal and no suicidal ideation. He expresses no suicidal plans and no homicidal plans.  Nursing note and vitals reviewed.   Assessment & Plan:   1. Type 2 diabetes mellitus with hyperglycemia, without long-term current use of insulin (HCC)  - Glucose (CBG) - HgB A1c - metFORMIN (GLUCOPHAGE) 1000 MG tablet; Take 1 tablet (1,000 mg total) by mouth 2 (two) times daily with a meal.  Dispense: 60 tablet; Refill: 2 - glucose blood (TRUE METRIX BLOOD GLUCOSE TEST) test strip; Use as instructed  Dispense: 100 each; Refill: 12 - gabapentin (NEURONTIN) 300 MG capsule; Take 1 capsule (300 mg total) by mouth at bedtime.  Dispense: 30 capsule; Refill: 2  2. Anxiety  - escitalopram (LEXAPRO) 20 MG tablet; Take 1 tablet (20 mg total) by mouth daily.  Dispense: 30 tablet; Refill: 3  3. Chronic right shoulder pain  - Ambulatory referral to Pain Clinic - meloxicam (MOBIC) 7.5 MG tablet; Take 1 tablet (7.5 mg total) by mouth daily as needed for pain. Take with food.  Dispense: 30 tablet; Refill: 2 - Ambulatory referral to Physical Therapy  4. Essential hypertension Continue current medications. Discussed dietary and exercise interventions.      Follow-up: Return in about 3 months (around 03/26/2018) for DM.   Alfonse Spruce FNP

## 2017-12-31 ENCOUNTER — Ambulatory Visit (INDEPENDENT_AMBULATORY_CARE_PROVIDER_SITE_OTHER): Payer: PRIVATE HEALTH INSURANCE | Admitting: Orthopaedic Surgery

## 2018-01-03 ENCOUNTER — Telehealth (INDEPENDENT_AMBULATORY_CARE_PROVIDER_SITE_OTHER): Payer: Self-pay

## 2018-01-03 ENCOUNTER — Encounter (INDEPENDENT_AMBULATORY_CARE_PROVIDER_SITE_OTHER): Payer: Self-pay | Admitting: Orthopaedic Surgery

## 2018-01-03 ENCOUNTER — Ambulatory Visit (INDEPENDENT_AMBULATORY_CARE_PROVIDER_SITE_OTHER): Payer: PRIVATE HEALTH INSURANCE | Admitting: Orthopaedic Surgery

## 2018-01-03 DIAGNOSIS — M7581 Other shoulder lesions, right shoulder: Secondary | ICD-10-CM

## 2018-01-03 MED ORDER — METHYLPREDNISOLONE ACETATE 40 MG/ML IJ SUSP
40.0000 mg | INTRAMUSCULAR | Status: AC | PRN
  Administered 2018-01-03: 40 mg via INTRA_ARTICULAR

## 2018-01-03 MED ORDER — LIDOCAINE HCL 2 % IJ SOLN
2.0000 mL | INTRAMUSCULAR | Status: AC | PRN
  Administered 2018-01-03: 2 mL

## 2018-01-03 MED ORDER — BUPIVACAINE HCL 0.25 % IJ SOLN
2.0000 mL | INTRAMUSCULAR | Status: AC | PRN
  Administered 2018-01-03: 2 mL via INTRA_ARTICULAR

## 2018-01-03 NOTE — Telephone Encounter (Signed)
We need to submit application for Monovisc injection.  MONOVISC INJECTION LEFT KNEE DR. Roda ShuttersXU

## 2018-01-03 NOTE — Progress Notes (Signed)
    Patient: Adrian Schultz           Date of Birth: May 05, 1966           MRN: [id number] Visit Date: 01/03/2018 PCP: Lizbeth BarkHairston, Mandesia R, FNP   Assessment & Plan:  Chief Complaint:  Chief Complaint  Patient presents with  . Right Shoulder - Pain, Follow-up   Visit Diagnoses:  1. Rotator cuff tendinitis, right     Plan: Dorene SorrowJerry comes in for follow-up.  We saw him last week for left knee pain and right shoulder rotator cuff tendinitis.  We injected his left knee which significantly helped for the first few days.  The pain is returned.  We are unable to inject the shoulder at that point as he is diabetic and we did not want injected with 2 cortisone shots.  He comes in today for his right shoulder injection.  Today we injected his right shoulder subacromial joint with cortisone.  He is encouraged to rest ice and elevate as much as possible today and tomorrow.  He will call us next week and let us know how his left knee is.  If he continues to have pain we may have him come back for a Monovisc injection.  We will wait to hear from him.  Follow-Up Instructions: Return if symptoms worsen or fail to improve.   Orders:  No orders of the defined types were placed in this encounter.  No orders of the defined types were placed in this encounter.   Imaging: None today  PMFS History: Patient Active Problem List   Diagnosis Date Noted  . Rotator cuff tendinitis, right 12/27/2017  . Primary localized osteoarthritis of left knee 12/27/2017  . Neck pain 12/27/2017  . Anxiety 12/27/2017  . History of panic attacks 12/27/2017  . Chronic right shoulder pain 12/27/2017  . Essential hypertension 10/04/2017  . Alcohol abuse with alcohol-induced mood disorder (HCC) 09/06/2017  . Hypercholesteremia 07/04/2017  . Bronchiectasis (HCC) 06/17/2017  . Type 2 diabetes mellitus without complication, without long-term current use of insulin (HCC) 04/05/2017  . Intoxication 07/09/2013   Past Medical  History:  Diagnosis Date  . Diabetes mellitus without complication (HCC)   . ETOH abuse   . Hypercholesteremia   . Hypertension   . Suicidal ideation     History reviewed. No pertinent family history.  Past Surgical History:  Procedure Laterality Date  . FOOT SURGERY    . KNEE SURGERY     Social History   Occupational History  . Occupation: Unemployeed  Tobacco Use  . Smoking status: Never Smoker  . Smokeless tobacco: Current User    Types: Snuff  . Tobacco comment: daily since 52 years old  Substance and Sexual Activity  . Alcohol use: Yes    Alcohol/week: 0.0 oz    Comment: beer - daily 1/2 a case   . Drug use: No  . Sexual activity: Not Currently

## 2018-01-03 NOTE — Telephone Encounter (Signed)
Called and left a VM with patient's counselor at [facility]to return my call concerning approval to file for Monovisc Injection.

## 2018-01-03 NOTE — Telephone Encounter (Signed)
Talked with patient and advised him that [facility]does not cover for Monovisc injection and that he could apply for [employer] [facility].  Stated that he would come by the office today to pick up the forms that need to be completed.

## 2018-01-03 NOTE — Telephone Encounter (Signed)
Talked with Case worker tech Aram BeechamCynthia with [facility]and she stated that they do not cover for Monovisc injection.

## 2018-01-03 NOTE — Progress Notes (Signed)
   Procedure Note  Patient: Adrian Schultz             Date of Birth: 1966-07-07           MRN: [id number]             Visit Date: 01/03/2018  Procedures: Visit Diagnoses: Rotator cuff tendinitis, right  Large Joint Inj: R subacromial bursa on 01/03/2018 10:21 AM Indications: pain Details: 22 G needle Medications: 2 mL lidocaine 2 %; 2 mL bupivacaine 0.25 %; 40 mg methylPREDNISolone acetate 40 MG/ML Outcome: tolerated well, no immediate complications Patient was prepped and draped in the usual sterile fashion.

## 2018-01-07 DIAGNOSIS — E0800 Diabetes mellitus due to underlying condition with hyperosmolarity without nonketotic hyperglycemic-hyperosmolar coma (NKHHC): Secondary | ICD-10-CM

## 2018-01-07 LAB — GLUCOSE, POCT (MANUAL RESULT ENTRY): POC Glucose: 260 mg/dl (ref 70–99)

## 2018-01-07 NOTE — Congregational Nurse Program (Signed)
Congregational Nurse Program Note  Date of Encounter: 01/07/2018  Past Medical History: Past Medical History:  Diagnosis Date  . Diabetes mellitus without complication (HCC)   . ETOH abuse   . Hypercholesteremia   . Hypertension   . Suicidal ideation     Encounter Details: CNP Questionnaire - 01/07/18 2331      Questionnaire   Patient Status  Not Applicable    Race  White or Caucasian    Location Patient Served At  Not Applicable    Insurance  Not Applicable    Uninsured  Uninsured (NEW 1x/quarter)    Food  No food insecurities    Housing/Utilities  No permanent housing    Transportation  No transportation needs    Interpersonal Safety  Yes, feel physically and emotionally safe where you currently live    Medication  No medication insecurities    Medical Provider  Yes    Referrals  Primary Care Provider/Clinic;Behavioral/Mental Health Provider    ED Visit Averted  Not Applicable    Life-Saving Intervention Made  Not Applicable      Clinical Intake - 12/27/17 1432      Pain   Pain   0-10    Pain Score  6     Pain Type  Chronic pain    Pain Location  -- neck and shoulder   neck and shoulder     Nutrition Screen   Diabetes  Yes    CBG done?  Yes    CBG resulted in Enter/ Edit results?  Yes      Functional Status   Activities of Daily Living  Independent    Ambulation  Independent    Medication Administration  Independent    Home Management  Independent      Risk/Barriers   Barriers to Care Management & Learning  None    Psychosocial Barriers  Financial need      Abuse/Neglect   Do you feel unsafe in your current relationship?  No    Do you feel physically threatened by others?  No    Anyone hurting you at home, work, or school?  No    Unable to ask?  No     Client in today after nurse spoke with client in the hallway. He states he feels down today and became emotional ,not working at  [employer] any longer ,states I feel bad because I cant do what I did 20  years ago. Nurse was supportive ,allowed to talk and counseled client on activity level. States he hasn't been keeping his appointments because of transportation,[facility] . [facility] ,nurse will give bus passes to case manager for that appointment next week. As client talked and became emotional he stated let me be clear I don't feel like hurting myself or anyone else ,he was just sad . Blood sugar was elevated  260 mg @ 5:07 pm states that is better good ,nurse counseled client regarding his diabetes and the need to take his medications . He was to take his dose upon exit.  Was seen at  [facility] and had injections in his knees and shoulder stated that may contribute to a rise in blood sugars.  Nurse will monitor weekly when client is in but client is usually out during regular hours.

## 2018-01-08 MED FILL — ?METFORMIN HCL 1,000 MG TAB: 1000 | 30 days supply | Qty: 60 | Fill #0

## 2018-01-08 MED FILL — MELOXICAM 7.5 MG TABLET: 7.5 | 30 days supply | Qty: 30 | Fill #0

## 2018-01-08 MED FILL — ESCITALOPRAM 20 MG TABLET: 20 | 30 days supply | Qty: 30 | Fill #0

## 2018-01-08 MED FILL — TRUE METRIX TEST STRIP: 30 days supply | Qty: 100 | Fill #0

## 2018-01-15 NOTE — Congregational Nurse Program (Signed)
Congregational Nurse Program Note  Date of Encounter: 01/15/2018  Past Medical History: Past Medical History:  Diagnosis Date  . Diabetes mellitus without complication (HCC)   . ETOH abuse   . Hypercholesteremia   . Hypertension   . Suicidal ideation     Encounter Details: CNP Questionnaire - 01/15/18 1736      Questionnaire   Patient Status  Not Applicable    Race  White or Caucasian    Location Patient Served At  Not Applicable    Insurance  Not Applicable    Uninsured  Uninsured (Subsequent visits/quarter)    Food  No food insecurities    Housing/Utilities  No permanent housing    Transportation  Yes, need transportation assistance;Provided transportation assistance (bus pass, taxi voucher, etc.)    Interpersonal Safety  Yes, feel physically and emotionally safe where you currently live    Medication  Yes, have medication insecurities    Medical Provider  Yes    Referrals  Primary Care Provider/Clinic;Area Agency;Behavioral/Mental Health Provider    ED Visit Averted  Not Applicable    Life-Saving Intervention Made  Not Applicable      Clinical Intake - 12/27/17 1432      Pain   Pain   0-10    Pain Score  6     Pain Type  Chronic pain    Pain Location  -- neck and shoulder   neck and shoulder     Nutrition Screen   Diabetes  Yes    CBG done?  Yes    CBG resulted in Enter/ Edit results?  Yes      Functional Status   Activities of Daily Living  Independent    Ambulation  Independent    Medication Administration  Independent    Home Management  Independent      Risk/Barriers   Barriers to Care Management & Learning  None    Psychosocial Barriers  Financial need      Abuse/Neglect   Do you feel unsafe in your current relationship?  No    Do you feel physically threatened by others?  No    Anyone hurting you at home, work, or school?  No    Unable to ask?  No     Client states [facility] but [facility] because he feels nthat is better for him Client has problems remembering and then starts to cry. Nurse was supportive  And tried to get him to focus on positives , he cries very easily and states he hears voices but keeps it inside ,suffers from PTSD was firm that  He has no intention on hurting himself or others ,he would protect someone if they were in danger. To talk with someone about disability next week from the [facility]. Has two upcoming appointments next week [facility] on Tuesday and  [facility] ,bus tickets given for both appointments. . States he takes his medications ,never any illegal drugs and if he reads about the drugs he wont take them . Marland Kitchen.   Client is depressed and stated I don't want no more drugs . One needs to be patient with him . Has a cold today taking Oc med's Will see client weekly /supportand monitor diabetes/medications. .Marland Kitchen

## 2018-01-20 ENCOUNTER — Other Ambulatory Visit: Payer: Self-pay | Admitting: Family Medicine

## 2018-01-20 DIAGNOSIS — M25511 Pain in right shoulder: Principal | ICD-10-CM

## 2018-01-20 DIAGNOSIS — G8929 Other chronic pain: Secondary | ICD-10-CM

## 2018-01-20 DIAGNOSIS — M542 Cervicalgia: Secondary | ICD-10-CM

## 2018-02-04 ENCOUNTER — Telehealth: Payer: Self-pay | Admitting: Family Medicine

## 2018-02-04 MED FILL — GABAPENTIN 300 MG CAPSULE: 300 | 30 days supply | Qty: 30 | Fill #1

## 2018-02-04 NOTE — Telephone Encounter (Signed)
Pt called to request a refill on  -traMADol (ULTRAM) 50 MG tablet  -glucose blood (TRUE METRIX BLOOD GLUCOSE TEST) test strip  Please send to [facility] pharmacy if approved

## 2018-02-07 MED FILL — MELOXICAM 7.5 MG TABLET: 7.5 | 30 days supply | Qty: 30 | Fill #1

## 2018-02-07 MED FILL — ?METFORMIN HCL 1,000 MG TAB: 1000 | 30 days supply | Qty: 60 | Fill #1

## 2018-02-07 MED FILL — ESCITALOPRAM 20 MG TABLET: 20 | 30 days supply | Qty: 30 | Fill #1

## 2018-02-07 MED FILL — TRUE METRIX TEST STRIP: 30 days supply | Qty: 100 | Fill #1

## 2018-02-12 NOTE — Congregational Nurse Program (Signed)
Congregational Nurse Program Note  Date of Encounter: 02/12/2018  Past Medical History: Past Medical History:  Diagnosis Date  . Diabetes mellitus without complication ([place])   . ETOH abuse   . Hypercholesteremia   . Hypertension   . Suicidal ideation     Encounter Details: CNP Questionnaire - 02/12/18 1849      Questionnaire   Patient Status  Not Applicable    Race  White or Caucasian    Location Patient Served At  Not Applicable    Insurance  Not Applicable    Uninsured  Uninsured (Subsequent visits/quarter)    Food  No food insecurities    Housing/Utilities  No permanent housing    Transportation  Provided transportation assistance (bus pass, taxi voucher, etc.);Yes, need transportation assistance    Interpersonal Safety  Yes, feel physically and emotionally safe where you currently live    Medication  Yes, have medication insecurities    Medical Provider  Yes    Referrals  Primary Care Provider/Clinic;Behavioral/Mental Health Provider    ED Visit Averted  Not Applicable    Life-Saving Intervention Made  Not Applicable      Client met nurse in hallway in because he needs help with getting to see orthopedist next week missed appointment last week needs bus passes . Nurse will assist . Client's  [employer] card has expired will call/complete his information for renewal . Client to return next week for bus pass and completion of [employer] application if needed for renewal. Has appointment at  [facility] on April 3,willcheck his blood sugar next week before dinner

## 2018-02-14 ENCOUNTER — Telehealth: Payer: Self-pay | Admitting: Pharmacist

## 2018-02-14 DIAGNOSIS — I10 Essential (primary) hypertension: Secondary | ICD-10-CM

## 2018-02-14 MED ORDER — LOSARTAN POTASSIUM 50 MG PO TABS: 50.0000 mg | ORAL_TABLET | Freq: Every day | ORAL | 0 refills | Status: DC

## 2018-02-14 MED FILL — LOSARTAN POTASSIUM 50 MG TA: 50 | 30 days supply | Qty: 30 | Fill #0

## 2018-02-14 NOTE — Telephone Encounter (Signed)
Received fax from pharmacy - patient requesting refill on losartan and tramadol. Losartan refilled. Will forward to Dr. Laural BenesJohnson who patient is establishing with for tramadol request.

## 2018-02-18 ENCOUNTER — Telehealth: Payer: Self-pay

## 2018-02-18 NOTE — Telephone Encounter (Signed)
Nurse called in reference to his transportation SCAT card ,client's card expired February,2018 will have to complete a new application.. Will work with client to complete

## 2018-02-21 ENCOUNTER — Telehealth: Payer: Self-pay

## 2018-02-25 NOTE — Telephone Encounter (Signed)
Error JA 

## 2018-03-03 NOTE — Congregational Nurse Program (Signed)
Congregational Nurse Program Note  Date of Encounter: 02/26/2018  Past Medical History: Past Medical History:  Diagnosis Date  . Diabetes mellitus without complication (HCC)   . ETOH abuse   . Hypercholesteremia   . Hypertension   . Suicidal ideation     Encounter Details: CNP Questionnaire - 03/03/18 2239      Questionnaire   Patient Status  Not Applicable    Race  White or Caucasian    Location Patient Served At  Not Applicable    Insurance  Not Applicable    Uninsured  Not Applicable    Food  No food insecurities    Housing/Utilities  No permanent housing    Transportation  Provided transportation assistance (bus pass, taxi voucher, etc.);Yes, need transportation assistance;Within past 12 months, lack of transportation negatively impacted life    Interpersonal Safety  Yes, feel physically and emotionally safe where you currently live    Medication  Yes, have medication insecurities    Medical Provider  Yes    Referrals  Primary Care Provider/Clinic    ED Visit Averted  Not Applicable    Life-Saving Intervention Made  Not Applicable      was in to see but  Due to emergency ask client to return in 15 minutes Client failed to return  . Nurse went to clients room he was not there . Client didn't show on 02-18-18 to assist him with reapplying for [employer] . SW looked for him was signed out. Will attempt to complete application on viist this week 03-05-18.

## 2018-03-06 ENCOUNTER — Encounter (HOSPITAL_COMMUNITY): Payer: Self-pay | Admitting: Emergency Medicine

## 2018-03-06 ENCOUNTER — Other Ambulatory Visit: Payer: Self-pay

## 2018-03-06 ENCOUNTER — Emergency Department (HOSPITAL_COMMUNITY)
Admission: EM | Admit: 2018-03-06 | Discharge: 2018-03-07 | Disposition: A | Discharge status: Home / Self Care | Payer: Self-pay | Attending: Emergency Medicine | Admitting: Emergency Medicine

## 2018-03-06 DIAGNOSIS — S065X9A Traumatic subdural hemorrhage with loss of consciousness of unspecified duration, initial encounter: Secondary | ICD-10-CM

## 2018-03-06 DIAGNOSIS — Y939 Activity, unspecified: Secondary | ICD-10-CM | POA: Insufficient documentation

## 2018-03-06 DIAGNOSIS — Z79899 Other long term (current) drug therapy: Secondary | ICD-10-CM | POA: Insufficient documentation

## 2018-03-06 DIAGNOSIS — Y929 Unspecified place or not applicable: Secondary | ICD-10-CM | POA: Insufficient documentation

## 2018-03-06 DIAGNOSIS — E119 Type 2 diabetes mellitus without complications: Secondary | ICD-10-CM | POA: Insufficient documentation

## 2018-03-06 DIAGNOSIS — Y999 Unspecified external cause status: Secondary | ICD-10-CM | POA: Insufficient documentation

## 2018-03-06 DIAGNOSIS — I10 Essential (primary) hypertension: Secondary | ICD-10-CM | POA: Insufficient documentation

## 2018-03-06 DIAGNOSIS — S01112A Laceration without foreign body of left eyelid and periocular area, initial encounter: Secondary | ICD-10-CM | POA: Insufficient documentation

## 2018-03-06 DIAGNOSIS — Z7984 Long term (current) use of oral hypoglycemic drugs: Secondary | ICD-10-CM | POA: Insufficient documentation

## 2018-03-06 DIAGNOSIS — W19XXXA Unspecified fall, initial encounter: Secondary | ICD-10-CM

## 2018-03-06 DIAGNOSIS — W0110XA Fall on same level from slipping, tripping and stumbling with subsequent striking against unspecified object, initial encounter: Secondary | ICD-10-CM | POA: Insufficient documentation

## 2018-03-06 DIAGNOSIS — S0181XA Laceration without foreign body of other part of head, initial encounter: Secondary | ICD-10-CM

## 2018-03-06 DIAGNOSIS — S065XAA Traumatic subdural hemorrhage with loss of consciousness status unknown, initial encounter: Secondary | ICD-10-CM

## 2018-03-06 DIAGNOSIS — R51 Headache: Secondary | ICD-10-CM | POA: Insufficient documentation

## 2018-03-06 NOTE — ED Triage Notes (Signed)
[facility] BIB [facility] from [facility]. EMS reports unwitnessed fall, possible syncope. +ETOH this evening, though pt won't say how much. Pt has small lac, swelling above L eye. Reports L knee pain. Pt would not tolerate C-collar. Took towel roll off shortly after arriving here.

## 2018-03-06 NOTE — ED Provider Notes (Signed)
[facility] EMERGENCY DEPARTMENT Provider Note   CSN: [id number] Arrival date & time: 03/06/18  2329     History   Chief Complaint Chief Complaint  Patient presents with  . Fall  . Altered Mental Status    HPI Adrian Schultz is a 52 y.o. male.  Patient is a 52 year old male with history of hypertension, diabetes, alcohol abuse.  He was brought by EMS after an apparent fall.  He states he was drinking heavily this evening, then fell striking his head.  He has a laceration above his left eye along with periorbital bruising bilaterally.  He complains of headache, facial pain, and neck pain.  He was unwilling to allow EMS to apply a cervical collar.  He denies other injury.  The history is provided by the patient.  Fall  This is a new problem. The current episode started less than 1 hour ago. The problem occurs constantly. The problem has not changed since onset.Associated symptoms include headaches. Pertinent negatives include no chest pain. Nothing aggravates the symptoms. Nothing relieves the symptoms. He has tried nothing for the symptoms.    Past Medical History:  Diagnosis Date  . Diabetes mellitus without complication ([place])   . ETOH abuse   . Hypercholesteremia   . Hypertension   . Suicidal ideation     Patient Active Problem List   Diagnosis Date Noted  . Rotator cuff tendinitis, right 12/27/2017  . Primary localized osteoarthritis of left knee 12/27/2017  . Neck pain 12/27/2017  . Anxiety 12/27/2017  . History of panic attacks 12/27/2017  . Chronic right shoulder pain 12/27/2017  . Essential hypertension 10/04/2017  . Alcohol abuse with alcohol-induced mood disorder ([place]) 09/06/2017  . Hypercholesteremia 07/04/2017  . Bronchiectasis ([place]) 06/17/2017  . Type 2 diabetes mellitus without complication, without long-term current use of insulin ([place]) 04/05/2017  . Intoxication 07/09/2013    Past Surgical History:  Procedure Laterality Date  . FOOT  SURGERY    . KNEE SURGERY          Home Medications    Prior to Admission medications   Medication Sig Start Date End Date Taking? Authorizing Provider  Blood Glucose Monitoring Suppl (TRUE METRIX METER) w/Device KIT 1 each by Does not apply route daily. 09/25/17   Alfonse Spruce, FNP  escitalopram (LEXAPRO) 20 MG tablet Take 1 tablet (20 mg total) by mouth daily. 12/27/17   Alfonse Spruce, FNP  gabapentin (NEURONTIN) 300 MG capsule Take 1 capsule (300 mg total) by mouth at bedtime. 12/27/17   Alfonse Spruce, FNP  glucose blood (TRUE METRIX BLOOD GLUCOSE TEST) test strip Use as instructed 12/27/17   Alfonse Spruce, FNP  losartan (COZAAR) 50 MG tablet Take 1 tablet (50 mg total) by mouth daily. 02/14/18   Tresa Garter, MD  meloxicam (MOBIC) 7.5 MG tablet Take 1 tablet (7.5 mg total) by mouth daily as needed for pain. Take with food. 12/27/17   Alfonse Spruce, FNP  metFORMIN (GLUCOPHAGE) 1000 MG tablet Take 1 tablet (1,000 mg total) by mouth 2 (two) times daily with a meal. 12/27/17   Hairston, Maylon Peppers, FNP  traMADol (ULTRAM) 50 MG tablet TAKE 1 TABLET BY MOUTH EVERY 8 HOURS AS NEEDED FOR SEVERE PAIN. USE SPARINGLY. 12/04/17   Alfonse Spruce, FNP    Family History History reviewed. No pertinent family history.  Social History Social History   Tobacco Use  . Smoking status: Never Smoker  . Smokeless tobacco: Current  User    Types: Snuff  . Tobacco comment: daily since 52 years old  Substance Use Topics  . Alcohol use: Yes    Alcohol/week: 0.0 oz    Comment: beer - daily 1/2 a case   . Drug use: No     Allergies   Lisinopril   Review of Systems Review of Systems  Cardiovascular: Negative for chest pain.  Neurological: Positive for headaches.  All other systems reviewed and are negative.    Physical Exam Updated Vital Signs BP 106/68 (BP Location: Right Arm)   Pulse 91   Temp 97.6 F (36.4 C) (Oral)   Resp 17   Ht 5' 6" (1.676 m)    Wt 95.3 kg (210 lb)   SpO2 98%   BMI 33.89 kg/m   Physical Exam  Constitutional: He is oriented to person, place, and time. He appears well-developed and well-nourished. No distress.  There is a strong odor of alcohol present.  HENT:  Head: Normocephalic and atraumatic.  There is a laceration above the left eye measuring approximately 2.5 cm.  Eyes: Pupils are equal, round, and reactive to light. EOM are normal.  There are periorbital ecchymoses bilaterally.  There is no proptosis and no diplopia with upward gaze.  Neck: Normal range of motion. Neck supple.  He describes tenderness in the soft tissues of the cervical region.  There is no bony tenderness or step-off.  Cardiovascular: Normal rate and regular rhythm. Exam reveals no friction rub.  No murmur heard. Pulmonary/Chest: Effort normal and breath sounds normal. No respiratory distress. He has no wheezes. He has no rales.  Abdominal: Soft. Bowel sounds are normal. He exhibits no distension. There is no tenderness.  Musculoskeletal: Normal range of motion. He exhibits no edema.  Neurological: He is alert and oriented to person, place, and time. No cranial nerve deficit or sensory deficit. He exhibits normal muscle tone. Coordination normal.  Skin: Skin is warm and dry. He is not diaphoretic.  Nursing note and vitals reviewed.    ED Treatments / Results  Labs (all labs ordered are listed, but only abnormal results are displayed) Labs Reviewed  BASIC METABOLIC PANEL  CBC WITH DIFFERENTIAL/PLATELET  ETHANOL    EKG None  Radiology No results found.  Procedures Procedures (including critical care time)  Medications Ordered in ED Medications - No data to display   Initial Impression / Assessment and Plan / ED Course  I have reviewed the triage vital signs and the nursing notes.  Pertinent labs & imaging results that were available during my care of the patient were reviewed by me and considered in my medical  decision making (see chart for details).  Patient brought here by EMS after a fall while intoxicated.  He caused a laceration above his left eye and CT scan also shows a small right-sided subdural hematoma.  This finding was discussed with Dr. Arnoldo Morale from neurosurgery.  The patient has been observed overnight and has not had a decline in his neurologic status.  Dr. Arnoldo Morale and I believe that this patient can safely be discharged.  He is taking no anticoagulants and appears appropriate for discharge.  The laceration above the eye was repaired.  Other than an alcohol level of 233, laboratory studies are unremarkable.  CT of the cervical spine is negative.  LACERATION REPAIR Performed by: Veryl Speak Authorized by: Veryl Speak Consent: Verbal consent obtained. Risks and benefits: risks, benefits and alternatives were discussed Consent given by: patient Patient identity confirmed: provided  demographic data Prepped and Draped in normal sterile fashion Wound explored  Laceration Location: Left eyebrow  Laceration Length: 2.5 cm  No Foreign Bodies seen or palpated  Anesthesia: local infiltration  Local anesthetic: lidocaine 1 % without epinephrine  Anesthetic total: 5 ml  Irrigation method: syringe Amount of cleaning: standard  Skin closure: 6-0 Prolene  Number of sutures: 4  Technique: Simple interrupted  Patient tolerance: Patient tolerated the procedure well with no immediate complications.   Final Clinical Impressions(s) / ED Diagnoses   Final diagnoses:  None    ED Discharge Orders    None       Veryl Speak, MD 03/07/18 [phone]

## 2018-03-07 ENCOUNTER — Emergency Department (HOSPITAL_COMMUNITY): Payer: Self-pay

## 2018-03-07 LAB — CBC WITH DIFFERENTIAL/PLATELET
BASOS PCT: 0 %
Basophils Absolute: 0 10*3/uL (ref 0.0–0.1)
EOS ABS: 0.2 10*3/uL (ref 0.0–0.7)
EOS PCT: 3 %
HCT: 40.1 % (ref 39.0–52.0)
HEMOGLOBIN: 13.9 g/dL (ref 13.0–17.0)
LYMPHS ABS: 2.8 10*3/uL (ref 0.7–4.0)
Lymphocytes Relative: 35 %
MCH: 33.1 pg (ref 26.0–34.0)
MCHC: 34.7 g/dL (ref 30.0–36.0)
MCV: 95.5 fL (ref 78.0–100.0)
Monocytes Absolute: 0.4 10*3/uL (ref 0.1–1.0)
Monocytes Relative: 5 %
NEUTROS PCT: 57 %
Neutro Abs: 4.6 10*3/uL (ref 1.7–7.7)
PLATELETS: 183 10*3/uL (ref 150–400)
RBC: 4.2 MIL/uL — AB (ref 4.22–5.81)
RDW: 13.6 % (ref 11.5–15.5)
WBC: 8 10*3/uL (ref 4.0–10.5)

## 2018-03-07 LAB — BASIC METABOLIC PANEL
Anion gap: 14 (ref 5–15)
BUN: 15 mg/dL (ref 6–20)
CALCIUM: 8.6 mg/dL — AB (ref 8.9–10.3)
CO2: 20 mmol/L — ABNORMAL LOW (ref 22–32)
CREATININE: 1.03 mg/dL (ref 0.61–1.24)
Chloride: 97 mmol/L — ABNORMAL LOW (ref 101–111)
Glucose, Bld: 245 mg/dL — ABNORMAL HIGH (ref 65–99)
Potassium: 4.1 mmol/L (ref 3.5–5.1)
SODIUM: 131 mmol/L — AB (ref 135–145)

## 2018-03-07 LAB — ETHANOL: ALCOHOL ETHYL (B): 233 mg/dL — AB (ref <10)

## 2018-03-07 MED ORDER — LIDOCAINE HCL (PF) 1 % IJ SOLN
5.0000 mL | Freq: Once | INTRAMUSCULAR | Status: AC
  Administered 2018-03-07: 5 mL
  Filled 2018-03-07: qty 5

## 2018-03-07 NOTE — ED Notes (Signed)
ED Provider at bedside. 

## 2018-03-07 NOTE — ED Notes (Signed)
Pt ambulatory in room

## 2018-03-07 NOTE — Discharge Instructions (Addendum)
Local wound care with bacitracin and dressing changes twice daily.  Sutures are to be removed in 5 days.  Please follow-up with your primary doctor for this.  Return to the emergency department for worsening headache, confusion, or other new and concerning symptoms.

## 2018-03-07 NOTE — Consult Note (Signed)
Reason for Consult: Right subdural hematoma Referring Physician: Dr. [name] is an 52 y.o. male.  HPI: The patient is a 52 year old white male who was intoxicated and took a fall.  He came to the ER with facial lacerations which were sutured.  A head CT was obtained which demonstrated a small right frontal subdural hematoma.  Patient was observed in the ER secondary to his intoxication.  A neurosurgical consultation was requested.  Presently the patient complains of a mild headache.  Denies neck pain, back pain, numbness, tingling, seizures, etc.  Past Medical History:  Diagnosis Date  . Diabetes mellitus without complication ([place])   . ETOH abuse   . Hypercholesteremia   . Hypertension   . Suicidal ideation     Past Surgical History:  Procedure Laterality Date  . FOOT SURGERY    . KNEE SURGERY      History reviewed. No pertinent family history.  Social History:  reports that he has never smoked. His smokeless tobacco use includes snuff. He reports that he drinks alcohol. He reports that he does not use drugs.  Allergies:  Allergies  Allergen Reactions  . Lisinopril Cough    Medications:  I have reviewed the patient's current medications. Prior to Admission:  (Not in a hospital admission) Scheduled: Continuous: PRN: Anti-infectives (From admission, onward)   None       Results for orders placed or performed during the hospital encounter of 03/06/18 (from the past 48 hour(s))  Basic metabolic panel     Status: Abnormal   Collection Time: 03/07/18 12:16 AM  Result Value Ref Range   Sodium 131 (L) 135 - 145 mmol/L   Potassium 4.1 3.5 - 5.1 mmol/L   Chloride 97 (L) 101 - 111 mmol/L   CO2 20 (L) 22 - 32 mmol/L   Glucose, Bld 245 (H) 65 - 99 mg/dL   BUN 15 6 - 20 mg/dL   Creatinine, Ser 1.03 0.61 - 1.24 mg/dL   Calcium 8.6 (L) 8.9 - 10.3 mg/dL   GFR calc non Af Amer >60 >60 mL/min   GFR calc Af Amer >60 >60 mL/min    Comment: (NOTE) The eGFR has been  calculated using the CKD EPI equation. This calculation has not been validated in all clinical situations. eGFR's persistently <60 mL/min signify possible Chronic Kidney Disease.    Anion gap 14 5 - 15    Comment: Performed at [facility] [address], [place], [place] [address]  CBC with Differential     Status: Abnormal   Collection Time: 03/07/18 12:16 AM  Result Value Ref Range   WBC 8.0 4.0 - 10.5 K/uL   RBC 4.20 (L) 4.22 - 5.81 MIL/uL   Hemoglobin 13.9 13.0 - 17.0 g/dL   HCT 40.1 39.0 - 52.0 %   MCV 95.5 78.0 - 100.0 fL   MCH 33.1 26.0 - 34.0 pg   MCHC 34.7 30.0 - 36.0 g/dL   RDW 13.6 11.5 - 15.5 %   Platelets 183 150 - 400 K/uL   Neutrophils Relative % 57 %   Neutro Abs 4.6 1.7 - 7.7 K/uL   Lymphocytes Relative 35 %   Lymphs Abs 2.8 0.7 - 4.0 K/uL   Monocytes Relative 5 %   Monocytes Absolute 0.4 0.1 - 1.0 K/uL   Eosinophils Relative 3 %   Eosinophils Absolute 0.2 0.0 - 0.7 K/uL   Basophils Relative 0 %   Basophils Absolute 0.0 0.0 - 0.1 K/uL  Comment: Performed at [facility], [place] [address], [place], [place] [address]  Ethanol     Status: Abnormal   Collection Time: 03/07/18 12:16 AM  Result Value Ref Range   Alcohol, Ethyl (B) 233 (H) <10 mg/dL    Comment:        LOWEST DETECTABLE LIMIT FOR SERUM ALCOHOL IS 10 mg/dL FOR MEDICAL PURPOSES ONLY Performed at [facility], [place] [address] [place], [place] [address]     Ct Head Wo Contrast  Result Date: 03/07/2018 CLINICAL DATA:  Initial evaluation for acute trauma, fall. EXAM: CT HEAD WITHOUT CONTRAST CT MAXILLOFACIAL WITHOUT CONTRAST CT CERVICAL SPINE WITHOUT CONTRAST TECHNIQUE: Multidetector CT imaging of the head, cervical spine, and maxillofacial structures were performed using the standard protocol without intravenous contrast. Multiplanar CT image reconstructions of the cervical spine and maxillofacial structures were also generated. COMPARISON:  Prior CT from 03/11/2016. FINDINGS: CT  HEAD FINDINGS Brain: Advanced age-related cerebral atrophy. Mild chronic small vessel ischemic disease. Remote right basal ganglia lacunar infarct. Small acute subdural hematoma overlies the right frontal convexity, measuring up to 4 mm in maximal thickness. No significant mass effect on the subjacent right cerebral hemisphere. No midline shift. No other acute intracranial hemorrhage. No acute large vessel territory infarct. No mass lesion or mass effect. No hydrocephalus. Vascular: No hyperdense vessel. Skull: Soft tissue contusion present at the right occipital scalp. No calvarial fracture. Other: Mastoid air cells are clear. CT MAXILLOFACIAL FINDINGS Osseous: Zygomatic arches are intact. No acute maxillary fracture. Pterygoid plates intact. Age indeterminate fracture of the right nasal bone noted (series 8, image 53). Nasal septum mildly bowed to the right but intact. No acute mandibular fracture. Mandibular condyles normally situated. No acute abnormality about the dentition. Orbits: Globes and orbital soft tissues within normal limits. Remote right orbital floor fracture noted. Bony orbits otherwise intact. Sinuses: Mild scattered mucoperiosteal thickening within the maxillary sinuses and ethmoidal air cells. Otherwise clear. No hemosinus. Soft tissues: Left periorbital soft tissue contusion. No other appreciable soft tissue injury about the face. CT CERVICAL SPINE FINDINGS Alignment: Study somewhat degraded by motion artifact. Mild straightening of the normal cervical lordosis.  No listhesis. Skull base and vertebrae: Skull base intact. Normal C1-2 articulations are preserved in the dens is intact. Vertebral body heights maintained. No acute fracture. Soft tissues and spinal canal: No acute soft tissue abnormality within the neck. Vascular calcifications about the carotid bifurcations. No abnormal prevertebral edema. Disc levels:  Mild degenerate spondylolysis noted at C5-6. Upper chest: Visualized upper chest  demonstrates no acute abnormality. Visualized lung apices are clear. Other: None. IMPRESSION: CT HEAD: 1. Small acute subdural hematoma overlying the right frontal convexity, measuring up to 4 mm in maximal thickness without significant mass effect. No calvarial fracture. 2. Advanced cerebral atrophy with chronic small vessel ischemic disease for age. CT MAXILLOFACIAL: 1. Left periorbital soft tissue contusion. 2. Age indeterminate mildly displaced right nasal bone fracture, favored to be chronic in nature, although correlation with physical exam is recommended. 3. Remote right orbital floor fracture. CT CERVICAL SPINE: No acute traumatic injury within the cervical spine. Critical Value/emergent results were called by telephone at the time of interpretation on 03/07/2018 at 2:17 am to Dr. Veryl Speak , who verbally acknowledged these results. Electronically Signed   By: Jeannine Boga M.D.   On: 03/07/2018 02:18   Ct Cervical Spine Wo Contrast  Result Date: 03/07/2018 CLINICAL DATA:  Initial evaluation for acute trauma, fall. EXAM: CT HEAD WITHOUT CONTRAST  CT MAXILLOFACIAL WITHOUT CONTRAST CT CERVICAL SPINE WITHOUT CONTRAST TECHNIQUE: Multidetector CT imaging of the head, cervical spine, and maxillofacial structures were performed using the standard protocol without intravenous contrast. Multiplanar CT image reconstructions of the cervical spine and maxillofacial structures were also generated. COMPARISON:  Prior CT from 03/11/2016. FINDINGS: CT HEAD FINDINGS Brain: Advanced age-related cerebral atrophy. Mild chronic small vessel ischemic disease. Remote right basal ganglia lacunar infarct. Small acute subdural hematoma overlies the right frontal convexity, measuring up to 4 mm in maximal thickness. No significant mass effect on the subjacent right cerebral hemisphere. No midline shift. No other acute intracranial hemorrhage. No acute large vessel territory infarct. No mass lesion or mass effect. No  hydrocephalus. Vascular: No hyperdense vessel. Skull: Soft tissue contusion present at the right occipital scalp. No calvarial fracture. Other: Mastoid air cells are clear. CT MAXILLOFACIAL FINDINGS Osseous: Zygomatic arches are intact. No acute maxillary fracture. Pterygoid plates intact. Age indeterminate fracture of the right nasal bone noted (series 8, image 63). Nasal septum mildly bowed to the right but intact. No acute mandibular fracture. Mandibular condyles normally situated. No acute abnormality about the dentition. Orbits: Globes and orbital soft tissues within normal limits. Remote right orbital floor fracture noted. Bony orbits otherwise intact. Sinuses: Mild scattered mucoperiosteal thickening within the maxillary sinuses and ethmoidal air cells. Otherwise clear. No hemosinus. Soft tissues: Left periorbital soft tissue contusion. No other appreciable soft tissue injury about the face. CT CERVICAL SPINE FINDINGS Alignment: Study somewhat degraded by motion artifact. Mild straightening of the normal cervical lordosis.  No listhesis. Skull base and vertebrae: Skull base intact. Normal C1-2 articulations are preserved in the dens is intact. Vertebral body heights maintained. No acute fracture. Soft tissues and spinal canal: No acute soft tissue abnormality within the neck. Vascular calcifications about the carotid bifurcations. No abnormal prevertebral edema. Disc levels:  Mild degenerate spondylolysis noted at C5-6. Upper chest: Visualized upper chest demonstrates no acute abnormality. Visualized lung apices are clear. Other: None. IMPRESSION: CT HEAD: 1. Small acute subdural hematoma overlying the right frontal convexity, measuring up to 4 mm in maximal thickness without significant mass effect. No calvarial fracture. 2. Advanced cerebral atrophy with chronic small vessel ischemic disease for age. CT MAXILLOFACIAL: 1. Left periorbital soft tissue contusion. 2. Age indeterminate mildly displaced right  nasal bone fracture, favored to be chronic in nature, although correlation with physical exam is recommended. 3. Remote right orbital floor fracture. CT CERVICAL SPINE: No acute traumatic injury within the cervical spine. Critical Value/emergent results were called by telephone at the time of interpretation on 03/07/2018 at 2:17 am to Dr. Veryl Speak , who verbally acknowledged these results. Electronically Signed   By: Jeannine Boga M.D.   On: 03/07/2018 02:18   Ct Maxillofacial Wo Contrast  Result Date: 03/07/2018 CLINICAL DATA:  Initial evaluation for acute trauma, fall. EXAM: CT HEAD WITHOUT CONTRAST CT MAXILLOFACIAL WITHOUT CONTRAST CT CERVICAL SPINE WITHOUT CONTRAST TECHNIQUE: Multidetector CT imaging of the head, cervical spine, and maxillofacial structures were performed using the standard protocol without intravenous contrast. Multiplanar CT image reconstructions of the cervical spine and maxillofacial structures were also generated. COMPARISON:  Prior CT from 03/11/2016. FINDINGS: CT HEAD FINDINGS Brain: Advanced age-related cerebral atrophy. Mild chronic small vessel ischemic disease. Remote right basal ganglia lacunar infarct. Small acute subdural hematoma overlies the right frontal convexity, measuring up to 4 mm in maximal thickness. No significant mass effect on the subjacent right cerebral hemisphere. No midline shift. No other acute intracranial hemorrhage. No  acute large vessel territory infarct. No mass lesion or mass effect. No hydrocephalus. Vascular: No hyperdense vessel. Skull: Soft tissue contusion present at the right occipital scalp. No calvarial fracture. Other: Mastoid air cells are clear. CT MAXILLOFACIAL FINDINGS Osseous: Zygomatic arches are intact. No acute maxillary fracture. Pterygoid plates intact. Age indeterminate fracture of the right nasal bone noted (series 8, image 28). Nasal septum mildly bowed to the right but intact. No acute mandibular fracture. Mandibular  condyles normally situated. No acute abnormality about the dentition. Orbits: Globes and orbital soft tissues within normal limits. Remote right orbital floor fracture noted. Bony orbits otherwise intact. Sinuses: Mild scattered mucoperiosteal thickening within the maxillary sinuses and ethmoidal air cells. Otherwise clear. No hemosinus. Soft tissues: Left periorbital soft tissue contusion. No other appreciable soft tissue injury about the face. CT CERVICAL SPINE FINDINGS Alignment: Study somewhat degraded by motion artifact. Mild straightening of the normal cervical lordosis.  No listhesis. Skull base and vertebrae: Skull base intact. Normal C1-2 articulations are preserved in the dens is intact. Vertebral body heights maintained. No acute fracture. Soft tissues and spinal canal: No acute soft tissue abnormality within the neck. Vascular calcifications about the carotid bifurcations. No abnormal prevertebral edema. Disc levels:  Mild degenerate spondylolysis noted at C5-6. Upper chest: Visualized upper chest demonstrates no acute abnormality. Visualized lung apices are clear. Other: None. IMPRESSION: CT HEAD: 1. Small acute subdural hematoma overlying the right frontal convexity, measuring up to 4 mm in maximal thickness without significant mass effect. No calvarial fracture. 2. Advanced cerebral atrophy with chronic small vessel ischemic disease for age. CT MAXILLOFACIAL: 1. Left periorbital soft tissue contusion. 2. Age indeterminate mildly displaced right nasal bone fracture, favored to be chronic in nature, although correlation with physical exam is recommended. 3. Remote right orbital floor fracture. CT CERVICAL SPINE: No acute traumatic injury within the cervical spine. Critical Value/emergent results were called by telephone at the time of interpretation on 03/07/2018 at 2:17 am to Dr. Veryl Speak , who verbally acknowledged these results. Electronically Signed   By: Jeannine Boga M.D.   On:  03/07/2018 02:18    ROS: As above Blood pressure 134/84, pulse 98, temperature 98.1 F (36.7 C), temperature source Oral, resp. rate 16, height 5' 6" (1.676 m), weight 95.3 kg (210 lb), SpO2 97 %. Estimated body mass index is 33.89 kg/m as calculated from the following:   Height as of this encounter: 5' 6" (1.676 m).   Weight as of this encounter: 95.3 kg (210 lb).  Physical Exam  General: A 52 year old obese white male with some facial bruising in no apparent distress  HEENT: The patient has left periorbital ecchymosis and a suture laceration.  His pupils are equal round reactive light, extraocular muscles are intact.  There is no evidence of CSF otorrhea or rhinorrhea.  Neck: Supple without masses or deformities, a somewhat limited range of motion.  Thorax: Symmetric  Abdomen: Obese and soft  Extremities: Unremarkable  Neurologic exam the patient is alert and oriented x3.  Glasgow Coma Scale 15.  Cranial nerves II through XII are examined bilaterally and grossly normal.  The patient's motor strength is 5/5 in spinal bicep, triceps, handgrip, gastrocnemius and right extensor hallucis longus.  He has some weakness in his left EHL which is chronic after a knee injury.  Sensory function is intact to light touch sensation all tested dermatomes bilaterally.  Cerebellar function is intact to rapid alternating movements of the upper extremities bilaterally.  Imaging studies I reviewed  the patient's head CT performed at [facility] today.  He has a small right frontal subdural hematoma without mass-effect.  I have also reviewed the patient's cervical CT.  He has some degenerative changes.  Assessment/Plan: Right subdural hematoma: The patient is clinically stable, he is not anticoagulated, he has sobered up.  He can follow-up with me as needed.  I advised him to quit drinking alcohol.  Adrian Schultz 03/07/2018, 7:44 AM

## 2018-03-24 ENCOUNTER — Encounter: Payer: Self-pay | Admitting: Internal Medicine

## 2018-03-24 ENCOUNTER — Ambulatory Visit: Payer: Self-pay | Attending: Internal Medicine | Admitting: Internal Medicine

## 2018-03-24 VITALS — BP 142/90 | HR 92 | Temp 97.7°F | Resp 16 | Wt 208.0 lb

## 2018-03-24 DIAGNOSIS — Z7984 Long term (current) use of oral hypoglycemic drugs: Secondary | ICD-10-CM | POA: Insufficient documentation

## 2018-03-24 DIAGNOSIS — M1732 Unilateral post-traumatic osteoarthritis, left knee: Secondary | ICD-10-CM

## 2018-03-24 DIAGNOSIS — Z5900 Homelessness unspecified: Secondary | ICD-10-CM

## 2018-03-24 DIAGNOSIS — IMO0001 Reserved for inherently not codable concepts without codable children: Secondary | ICD-10-CM

## 2018-03-24 DIAGNOSIS — M25511 Pain in right shoulder: Secondary | ICD-10-CM | POA: Insufficient documentation

## 2018-03-24 DIAGNOSIS — M1712 Unilateral primary osteoarthritis, left knee: Secondary | ICD-10-CM | POA: Insufficient documentation

## 2018-03-24 DIAGNOSIS — G8929 Other chronic pain: Secondary | ICD-10-CM | POA: Insufficient documentation

## 2018-03-24 DIAGNOSIS — E1165 Type 2 diabetes mellitus with hyperglycemia: Secondary | ICD-10-CM | POA: Insufficient documentation

## 2018-03-24 DIAGNOSIS — I10 Essential (primary) hypertension: Secondary | ICD-10-CM | POA: Insufficient documentation

## 2018-03-24 DIAGNOSIS — S0231XA Fracture of orbital floor, right side, initial encounter for closed fracture: Secondary | ICD-10-CM | POA: Insufficient documentation

## 2018-03-24 DIAGNOSIS — F419 Anxiety disorder, unspecified: Secondary | ICD-10-CM | POA: Insufficient documentation

## 2018-03-24 DIAGNOSIS — F1010 Alcohol abuse, uncomplicated: Secondary | ICD-10-CM | POA: Insufficient documentation

## 2018-03-24 DIAGNOSIS — S022XXA Fracture of nasal bones, initial encounter for closed fracture: Secondary | ICD-10-CM | POA: Insufficient documentation

## 2018-03-24 DIAGNOSIS — Z79899 Other long term (current) drug therapy: Secondary | ICD-10-CM | POA: Insufficient documentation

## 2018-03-24 LAB — POCT GLYCOSYLATED HEMOGLOBIN (HGB A1C): Hemoglobin A1C: 9.9

## 2018-03-24 LAB — GLUCOSE, POCT (MANUAL RESULT ENTRY): POC GLUCOSE: 335 mg/dL — AB (ref 70–99)

## 2018-03-24 MED ORDER — GLUCOSE BLOOD VI STRP: ORAL_STRIP | 12 refills | Status: DC

## 2018-03-24 MED ORDER — TRUE METRIX METER W/DEVICE KIT: 1.0000 | PACK | Freq: Every day | 0 refills | Status: DC

## 2018-03-24 MED ORDER — MELOXICAM 15 MG PO TABS: 15.0000 mg | ORAL_TABLET | Freq: Every day | ORAL | 4 refills | Status: DC | PRN

## 2018-03-24 MED ORDER — TETANUS-DIPHTH-ACELL PERTUSSIS 5-2.5-18.5 LF-MCG/0.5 IM SUSP: 0.5000 mL | Freq: Once | INTRAMUSCULAR | 0 refills | Status: AC

## 2018-03-24 MED ORDER — GLIMEPIRIDE 2 MG PO TABS: 2.0000 mg | ORAL_TABLET | Freq: Every day | ORAL | 3 refills | Status: DC

## 2018-03-24 MED ORDER — PNEUMOCOCCAL VAC POLYVALENT 25 MCG/0.5ML IJ INJ: 0.5000 mL | INJECTION | INTRAMUSCULAR | 0 refills | Status: AC

## 2018-03-24 MED ORDER — TRUEPLUS LANCETS 28G MISC: 4 refills | Status: DC

## 2018-03-24 MED FILL — TRUE METRIX TEST STRIP: 30 days supply | Qty: 100 | Fill #0

## 2018-03-24 MED FILL — GLIMEPIRIDE 2 MG TABLET: 2 | 30 days supply | Qty: 30 | Fill #0

## 2018-03-24 MED FILL — TRUEplus LANCETS 28G MISC: 30 days supply | Qty: 100 | Fill #0

## 2018-03-24 MED FILL — MELOXICAM 15 MG TABLET: 15 | 30 days supply | Qty: 30 | Fill #0

## 2018-03-24 NOTE — Patient Instructions (Signed)
Start Amaryl for blood sugar. Increase Meloxicam to 15 mg daily.
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
## 2018-03-25 ENCOUNTER — Other Ambulatory Visit: Payer: Self-pay | Admitting: Pharmacist

## 2018-03-25 ENCOUNTER — Other Ambulatory Visit: Payer: Self-pay

## 2018-03-25 MED ORDER — TETANUS-DIPHTH-ACELL PERTUSSIS 5-2.5-18.5 LF-MCG/0.5 IM SUSP: 0.5000 mL | INTRAMUSCULAR | 0 refills | Status: DC

## 2018-03-26 ENCOUNTER — Other Ambulatory Visit: Payer: Self-pay

## 2018-03-26 MED ORDER — PNEUMOCOCCAL VAC POLYVALENT 25 MCG/0.5ML IJ INJ: 0.5000 mL | INJECTION | Freq: Once | INTRAMUSCULAR | 0 refills | Status: AC

## 2018-05-08 ENCOUNTER — Ambulatory Visit (INDEPENDENT_AMBULATORY_CARE_PROVIDER_SITE_OTHER): Payer: Self-pay | Admitting: Orthopaedic Surgery

## 2018-05-08 ENCOUNTER — Telehealth (INDEPENDENT_AMBULATORY_CARE_PROVIDER_SITE_OTHER): Payer: Self-pay

## 2018-05-08 ENCOUNTER — Telehealth (INDEPENDENT_AMBULATORY_CARE_PROVIDER_SITE_OTHER): Payer: Self-pay | Admitting: Orthopedic Surgery

## 2018-05-08 ENCOUNTER — Encounter (INDEPENDENT_AMBULATORY_CARE_PROVIDER_SITE_OTHER): Payer: Self-pay | Admitting: Orthopaedic Surgery

## 2018-05-08 DIAGNOSIS — M542 Cervicalgia: Secondary | ICD-10-CM

## 2018-05-08 DIAGNOSIS — M25562 Pain in left knee: Secondary | ICD-10-CM

## 2018-05-08 DIAGNOSIS — G8929 Other chronic pain: Secondary | ICD-10-CM | POA: Insufficient documentation

## 2018-05-08 DIAGNOSIS — M25511 Pain in right shoulder: Secondary | ICD-10-CM

## 2018-05-08 MED ORDER — METHYLPREDNISOLONE 4 MG PO TBPK: ORAL_TABLET | ORAL | 0 refills | Status: DC

## 2018-05-08 NOTE — Progress Notes (Signed)
Office Visit Note   Patient: Adrian Schultz           Date of Birth: 1966-08-03           MRN: [id number] Visit Date: 05/08/2018              Requested by: Lizbeth BarkHairston, Mandesia R, FNP No address on file PCP: Lizbeth BarkHairston, Mandesia R, FNP   Assessment & Plan: Visit Diagnoses:  1. Chronic pain of left knee   2. Cervicalgia   3. Chronic right shoulder pain     Plan: Impression is right shoulder rotator cuff tendinitis,  left knee osteoarthritis and cervicalgia.  In regards to the left knee, we will obtain approval for Visco supplementation injection.  In regards to the right shoulder and neck, we will go ahead and obtain an MRI.  He will follow-up with us once these have been completed.   I will call in a prescription of a steroid taper in the meantime.  He will pay special attention  to his blood sugars over the next week.  Follow-Up Instructions: Return in about 2 weeks (around 05/22/2018).   Orders:  No orders of the defined types were placed in this encounter.  Meds ordered this encounter  Medications  . methylPREDNISolone (MEDROL DOSEPAK) 4 MG TBPK tablet    Sig: Take as directed    Dispense:  21 tablet    Refill:  0      Procedures: No procedures performed   Clinical Data: No additional findings.   Subjective: Chief Complaint  Patient presents with  . Right Shoulder - Pain  . Left Knee - Pain    HPI patient is a pleasant 52 year old gentleman who presents to our clinic today with recurrent left knee and right shoulder pain.  We saw him back in February for this.  The left knee was injected with cortisone in the right shoulder subacromial space was injected with cortisone as well.  He noted significant relief to the left knee for 2 weeks following the injection.  He noted significant relief to the right shoulder for only 3 days following the injection there.  His pain has returned to both areas.  He also notes pain and clicking to the neck.  Limited motion in all planes  secondary to pain and stiffness.  No numbness, tingling or burning.  Review of Systems as detailed in HPI.  All others reviewed and are negative.   Objective: Vital Signs: There were no vitals taken for this visit.  Physical Exam well-developed and well-nourished gentleman no acute distress.  Alert and oriented x3.  Ortho Exam examination of the right shoulder reveals full active range of motion in all planes.  He does have pain with the extremes of forward flexion and internal rotation.  Positive empty can and cross body abduction.  Left knee range of motion from 3 to 100 degrees.  Moderate patellofemoral crepitus.  Ligaments are stable.  Limited motion of the cervical spine.  Specialty Comments:  No specialty comments available.  Imaging: No new imaging   PMFS History: Patient Active Problem List   Diagnosis Date Noted  . Chronic pain of left knee 05/08/2018  . Rotator cuff tendinitis, right 12/27/2017  . Primary localized osteoarthritis of left knee 12/27/2017  . Cervicalgia 12/27/2017  . Anxiety 12/27/2017  . History of panic attacks 12/27/2017  . Chronic right shoulder pain 12/27/2017  . Essential hypertension 10/04/2017  . Alcohol abuse with alcohol-induced mood disorder ([facility]) 09/06/2017  . Hypercholesteremia  07/04/2017  . Bronchiectasis ([facility]) 06/17/2017  . Type 2 diabetes mellitus without complication, without long-term current use of insulin ([facility]) 04/05/2017  . Intoxication 07/09/2013   Past Medical History:  Diagnosis Date  . Diabetes mellitus without complication ([facility])   . ETOH abuse   . Hypercholesteremia   . Hypertension   . Suicidal ideation     History reviewed. No pertinent family history.  Past Surgical History:  Procedure Laterality Date  . FOOT SURGERY    . KNEE SURGERY     Social History   Occupational History  . Occupation: Unemployeed  Tobacco Use  . Smoking status: Never Smoker  . Smokeless tobacco: Current User    Types: Snuff  .  Tobacco comment: daily since 52 years old  Substance and Sexual Activity  . Alcohol use: Yes    Alcohol/week: 0.0 oz    Comment: beer - daily 1/2 a case   . Drug use: No  . Sexual activity: Not Currently

## 2018-05-08 NOTE — Telephone Encounter (Signed)
Patient is here through [facility], need to see if they will cover these.  If not [facility].

## 2018-05-08 NOTE — Telephone Encounter (Signed)
Please submit for gel injection- LEFT KNEE- DR Roda ShuttersXU.

## 2018-05-08 NOTE — Telephone Encounter (Signed)
error 

## 2018-05-22 NOTE — Telephone Encounter (Signed)
[facility] does not approve any gel injections.   Per Narda Bondsynthia Ingram at [facility], phone [phone]

## 2018-05-22 NOTE — Telephone Encounter (Signed)
See message below °

## 2018-05-22 NOTE — Telephone Encounter (Signed)
IC main number for [place] [facility], and LMVM for Adrian BeechamCynthia to call me back to discuss.

## 2018-05-22 NOTE — Telephone Encounter (Signed)
ok 

## 2018-05-23 ENCOUNTER — Encounter: Payer: Self-pay | Admitting: Internal Medicine

## 2018-05-23 ENCOUNTER — Ambulatory Visit: Payer: Self-pay | Attending: Internal Medicine | Admitting: Internal Medicine

## 2018-05-23 VITALS — BP 131/82 | HR 83 | Temp 97.9°F | Resp 16 | Wt 206.0 lb

## 2018-05-23 DIAGNOSIS — E119 Type 2 diabetes mellitus without complications: Secondary | ICD-10-CM

## 2018-05-23 DIAGNOSIS — Z9119 Patient's noncompliance with other medical treatment and regimen: Secondary | ICD-10-CM

## 2018-05-23 DIAGNOSIS — Z5320 Procedure and treatment not carried out because of patient's decision for unspecified reasons: Secondary | ICD-10-CM

## 2018-05-23 LAB — POCT GLYCOSYLATED HEMOGLOBIN (HGB A1C): HBA1C, POC (CONTROLLED DIABETIC RANGE): 9.5 % — AB (ref 0.0–7.0)

## 2018-05-23 LAB — GLUCOSE, POCT (MANUAL RESULT ENTRY): POC Glucose: 271 mg/dl — AB (ref 70–99)

## 2018-05-23 NOTE — Progress Notes (Signed)
Patient ID: Adrian Schultz, male    DOB: [birth year]  MRN: [id number]  CC: Diabetes and Hypertension   Subjective: Adrian Schultz is a 52 y.o. male who presents for His concerns today include:  Pt with hx of HTN, DM, ETOH abuse, anxiety, chronic RT shoulder pain  Pt left before being seen.  He rescheduled for next mth. Patient Active Problem List   Diagnosis Date Noted  . Chronic pain of left knee 05/08/2018  . Rotator cuff tendinitis, right 12/27/2017  . Primary localized osteoarthritis of left knee 12/27/2017  . Cervicalgia 12/27/2017  . Anxiety 12/27/2017  . History of panic attacks 12/27/2017  . Chronic right shoulder pain 12/27/2017  . Essential hypertension 10/04/2017  . Alcohol abuse with alcohol-induced mood disorder ([place]) 09/06/2017  . Hypercholesteremia 07/04/2017  . Bronchiectasis ([place]) 06/17/2017  . Type 2 diabetes mellitus without complication, without long-term current use of insulin ([place]) 04/05/2017  . Intoxication 07/09/2013     Current Outpatient Medications on File Prior to Visit  Medication Sig Dispense Refill  . Blood Glucose Monitoring Suppl (TRUE METRIX METER) w/Device KIT 1 each by Does not apply route daily. 1 kit 0  . escitalopram (LEXAPRO) 20 MG tablet Take 1 tablet (20 mg total) by mouth daily. 30 tablet 3  . gabapentin (NEURONTIN) 300 MG capsule Take 1 capsule (300 mg total) by mouth at bedtime. (Patient not taking: Reported on 05/08/2018) 30 capsule 2  . glimepiride (AMARYL) 2 MG tablet Take 1 tablet (2 mg total) by mouth daily before breakfast. 30 tablet 3  . glucose blood (TRUE METRIX BLOOD GLUCOSE TEST) test strip Use as instructed 100 each 12  . glucose blood (TRUE METRIX BLOOD GLUCOSE TEST) test strip Use as instructed 100 each 12  . losartan (COZAAR) 50 MG tablet Take 1 tablet (50 mg total) by mouth daily. 30 tablet 0  . meloxicam (MOBIC) 15 MG tablet Take 1 tablet (15 mg total) by mouth daily as needed for pain. Take with food. 30 tablet 4  .  metFORMIN (GLUCOPHAGE) 1000 MG tablet Take 1 tablet (1,000 mg total) by mouth 2 (two) times daily with a meal. 60 tablet 2  . methylPREDNISolone (MEDROL DOSEPAK) 4 MG TBPK tablet Take as directed 21 tablet 0  . Tdap (BOOSTRIX) 5-2.5-18.5 LF-MCG/0.5 injection Inject 0.5 mLs into the muscle as directed. 0.5 mL 0  . traMADol (ULTRAM) 50 MG tablet TAKE 1 TABLET BY MOUTH EVERY 8 HOURS AS NEEDED FOR SEVERE PAIN. USE SPARINGLY. (Patient not taking: Reported on 05/08/2018) 30 tablet 0  . TRUEPLUS LANCETS 28G MISC Use as directed 100 each 4   No current facility-administered medications on file prior to visit.     Allergies  Allergen Reactions  . Lisinopril Cough    Social History   Socioeconomic History  . Marital status: Divorced    Spouse name: Not on file  . Number of children: Not on file  . Years of education: Not on file  . Highest education level: Not on file  Occupational History  . Occupation: Unemployeed  Social Needs  . Financial resource strain: Not on file  . Food insecurity:    Worry: Not on file    Inability: Not on file  . Transportation needs:    Medical: Not on file    Non-medical: Not on file  Tobacco Use  . Smoking status: Never Smoker  . Smokeless tobacco: Current User    Types: Snuff  . Tobacco comment: daily since 52 years  old  Substance and Sexual Activity  . Alcohol use: Yes    Alcohol/week: 0.0 oz    Comment: beer - daily 1/2 a case   . Drug use: No  . Sexual activity: Not Currently  Lifestyle  . Physical activity:    Days per week: Not on file    Minutes per session: Not on file  . Stress: Not on file  Relationships  . Social connections:    Talks on phone: Not on file    Gets together: Not on file    Attends religious service: Not on file    Active member of club or organization: Not on file    Attends meetings of clubs or organizations: Not on file    Relationship status: Not on file  . Intimate partner violence:    Fear of current or ex  partner: Not on file    Emotionally abused: Not on file    Physically abused: Not on file    Forced sexual activity: Not on file  Other Topics Concern  . Not on file  Social History Narrative   Live in shelter - [employer]     No family history on file.  Past Surgical History:  Procedure Laterality Date  . FOOT SURGERY    . KNEE SURGERY      ROS: Review of Systems  PHYSICAL EXAM: BP 131/82   Pulse 83   Temp 97.9 F (36.6 C) (Oral)   Resp 16   Wt 206 lb (93.4 kg)   SpO2 97%   BMI 33.25 kg/m   Physical Exam   ASSESSMENT AND PLAN: Left before being seen  Patient was given the opportunity to ask questions.  Patient verbalized understanding of the plan and was able to repeat key elements of the plan.   Orders Placed This Encounter  Procedures  . POCT glucose (manual entry)  . POCT glycosylated hemoglobin (Hb A1C)     Requested Prescriptions    No prescriptions requested or ordered in this encounter    No follow-ups on file.  Karle Plumber, MD, FACP

## 2018-05-30 ENCOUNTER — Ambulatory Visit: Payer: Self-pay

## 2018-05-30 ENCOUNTER — Ambulatory Visit: Payer: Self-pay | Admitting: Pharmacist

## 2018-06-07 ENCOUNTER — Other Ambulatory Visit: Payer: Self-pay

## 2018-06-07 ENCOUNTER — Inpatient Hospital Stay: Admission: RE | Admit: 2018-06-07 | Payer: Self-pay | Source: Ambulatory Visit

## 2018-06-09 ENCOUNTER — Ambulatory Visit: Payer: Self-pay | Admitting: Internal Medicine

## 2018-06-11 ENCOUNTER — Ambulatory Visit (INDEPENDENT_AMBULATORY_CARE_PROVIDER_SITE_OTHER): Payer: PRIVATE HEALTH INSURANCE | Admitting: Orthopaedic Surgery

## 2018-07-02 ENCOUNTER — Ambulatory Visit: Payer: Self-pay | Attending: Family Medicine | Admitting: Pharmacist

## 2018-07-02 DIAGNOSIS — Z23 Encounter for immunization: Secondary | ICD-10-CM

## 2018-07-02 NOTE — Addendum Note (Signed)
Addended by: Lois HuxleyVAN AUSDALL, Jeannett SeniorSTEPHEN L on: 07/02/2018 10:51 AM   Modules accepted: Level of Service

## 2018-07-02 NOTE — Progress Notes (Signed)
Patient presents to clinic for Tdap and Pneumovax vaccine administration. History reviewed. Pt is a candidate for receiving these. Has completed pre-visit questionnaire which includes documented consent. No contraindications exist. Vaccine administered. Patient with no reactions to vaccine upon discharge.    [facility]   Pt seen with:  Mosie LukesMichele Muir, PharmD Candidate [facility] [employer] 2021  Butch PennyLuke Van Ausdall, PharmD, CPP Clinical Pharmacist [facility] [phone]

## 2018-11-03 ENCOUNTER — Ambulatory Visit: Payer: Self-pay | Attending: Family Medicine

## 2018-11-27 ENCOUNTER — Ambulatory Visit: Payer: Self-pay | Admitting: Internal Medicine

## 2018-12-04 MED FILL — MELOXICAM 15 MG TABLET: 15 | 30 days supply | Qty: 30 | Fill #1

## 2018-12-05 ENCOUNTER — Encounter: Payer: Self-pay | Admitting: Internal Medicine

## 2018-12-05 ENCOUNTER — Ambulatory Visit: Payer: Medicaid Other | Attending: Internal Medicine | Admitting: Internal Medicine

## 2018-12-05 VITALS — BP 135/98 | HR 99 | Temp 98.1°F | Resp 16 | Ht 66.0 in | Wt 204.0 lb

## 2018-12-05 DIAGNOSIS — F1021 Alcohol dependence, in remission: Secondary | ICD-10-CM | POA: Insufficient documentation

## 2018-12-05 DIAGNOSIS — Z888 Allergy status to other drugs, medicaments and biological substances status: Secondary | ICD-10-CM | POA: Diagnosis not present

## 2018-12-05 DIAGNOSIS — I10 Essential (primary) hypertension: Secondary | ICD-10-CM | POA: Diagnosis not present

## 2018-12-05 DIAGNOSIS — Z7984 Long term (current) use of oral hypoglycemic drugs: Secondary | ICD-10-CM | POA: Insufficient documentation

## 2018-12-05 DIAGNOSIS — Z720 Tobacco use: Secondary | ICD-10-CM | POA: Diagnosis not present

## 2018-12-05 DIAGNOSIS — Z5900 Homelessness unspecified: Secondary | ICD-10-CM

## 2018-12-05 DIAGNOSIS — E1165 Type 2 diabetes mellitus with hyperglycemia: Secondary | ICD-10-CM

## 2018-12-05 DIAGNOSIS — Z79899 Other long term (current) drug therapy: Secondary | ICD-10-CM | POA: Diagnosis not present

## 2018-12-05 DIAGNOSIS — M5412 Radiculopathy, cervical region: Secondary | ICD-10-CM | POA: Insufficient documentation

## 2018-12-05 DIAGNOSIS — IMO0001 Reserved for inherently not codable concepts without codable children: Secondary | ICD-10-CM

## 2018-12-05 DIAGNOSIS — Z791 Long term (current) use of non-steroidal anti-inflammatories (NSAID): Secondary | ICD-10-CM | POA: Diagnosis not present

## 2018-12-05 DIAGNOSIS — F419 Anxiety disorder, unspecified: Secondary | ICD-10-CM | POA: Diagnosis not present

## 2018-12-05 DIAGNOSIS — E119 Type 2 diabetes mellitus without complications: Secondary | ICD-10-CM | POA: Insufficient documentation

## 2018-12-05 DIAGNOSIS — F17220 Nicotine dependence, chewing tobacco, uncomplicated: Secondary | ICD-10-CM

## 2018-12-05 LAB — POCT GLYCOSYLATED HEMOGLOBIN (HGB A1C): HBA1C, POC (CONTROLLED DIABETIC RANGE): 10.7 % — AB (ref 0.0–7.0)

## 2018-12-05 LAB — GLUCOSE, POCT (MANUAL RESULT ENTRY): POC Glucose: 240 mg/dl — AB (ref 70–99)

## 2018-12-05 MED ORDER — PREGABALIN 25 MG PO CAPS: 50.0000 mg | ORAL_CAPSULE | Freq: Two times a day (BID) | ORAL | 1 refills | Status: DC

## 2018-12-05 MED ORDER — LOSARTAN POTASSIUM 50 MG PO TABS: 50.0000 mg | ORAL_TABLET | Freq: Every day | ORAL | 6 refills | Status: DC

## 2018-12-05 MED ORDER — METFORMIN HCL 1000 MG PO TABS: 1000.0000 mg | ORAL_TABLET | Freq: Two times a day (BID) | ORAL | 6 refills | Status: DC

## 2018-12-05 MED ORDER — PREGABALIN 50 MG PO CAPS: 50.0000 mg | ORAL_CAPSULE | Freq: Two times a day (BID) | ORAL | 2 refills | Status: DC

## 2018-12-05 MED ORDER — MELOXICAM 15 MG PO TABS: 15.0000 mg | ORAL_TABLET | Freq: Every day | ORAL | 6 refills | Status: DC | PRN

## 2018-12-05 MED FILL — metFORMIN HCL 1000 MG TABS: 1000 | 30 days supply | Qty: 60 | Fill #0

## 2018-12-05 MED FILL — LOSARTAN POTASSIUM 50 MG TA: 50 | 30 days supply | Qty: 30 | Fill #0

## 2018-12-05 NOTE — Progress Notes (Signed)
Patient ID: Adrian Schultz, male    DOB: Nov 13, 1966  MRN: [id number]  CC: Neck Pain and Shoulder Pain (right)   Subjective: Adrian Schultz is a 53 y.o. male who presents for chronic ds management His concerns today include:  Pt with hx of HTN, DM, ETOH abuse, anxiety, chronic RT shoulder pain  C/o pain in RT side of neck and shoulder; chronic but has been worse recently.  Pain is sticking and burning in character.  It is constant and worse with certain movement of the neck and shoulder. Out of Meloxicam. Also takes Tyl PM 1 at nights and a regular tylenol during the day Does not like taking Gabapentin.  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   DM:  No meter to check Med: compliant with Metformin. However taking 500 mg BID instead of 1 gram BID to stretch med out until he was able to be seen today.   Amaryl is on med list but patient states that he never took it.  Given his living situation, he has to eat what he can get.    Endorses blurred vision intermittently.  Last eye exam was 2 yrs ago.  Reports being told in the past that he has borderline glaucoma.  He is uninsured.  HTN:  Out of BP med for 6 mths  ETOH:  On Naltrexone x 3.  Clean since 07/2018. Went to a program called [facility] in [place] in Sept for 22 days -Chews tobacco  HM:  Had flu shot at [facility] in August or September Patient Active Problem List   Diagnosis Date Noted  . Chronic pain of left knee 05/08/2018  . Rotator cuff tendinitis, right 12/27/2017  . Primary localized osteoarthritis of left knee 12/27/2017  . Cervicalgia 12/27/2017  . Anxiety 12/27/2017  . History of panic attacks 12/27/2017  . Chronic right shoulder pain 12/27/2017  . Essential hypertension 10/04/2017  . Alcohol abuse with alcohol-induced mood disorder ([place]) 09/06/2017  . Hypercholesteremia 07/04/2017  .  Bronchiectasis ([place]) 06/17/2017  . Type 2 diabetes mellitus without complication, without long-term current use of insulin ([place]) 04/05/2017  . Intoxication 07/09/2013     Current Outpatient Medications on File Prior to Visit  Medication Sig Dispense Refill  . Blood Glucose Monitoring Suppl (TRUE METRIX METER) w/Device KIT 1 each by Does not apply route daily. 1 kit 0  . escitalopram (LEXAPRO) 20 MG tablet Take 1 tablet (20 mg total) by mouth daily. 30 tablet 3  . glucose blood (TRUE METRIX BLOOD GLUCOSE TEST) test strip Use as instructed 100 each 12  . glucose blood (TRUE METRIX BLOOD GLUCOSE TEST) test strip Use as instructed 100 each 12  . TRUEPLUS LANCETS 28G MISC Use as directed 100 each 4   No current facility-administered medications on file prior to visit.     Allergies  Allergen Reactions  . Lisinopril Cough    Social History   Socioeconomic History  . Marital status: Divorced    Spouse name: Not on file  . Number of children: Not on file  . Years of education: Not on file  . Highest education level: Not on file  Occupational History  . Occupation: Unemployeed  Social Needs  . Financial resource strain: Not on file  . Food insecurity:    Worry: Not on file  Inability: Not on file  . Transportation needs:    Medical: Not on file    Non-medical: Not on file  Tobacco Use  . Smoking status: Never Smoker  . Smokeless tobacco: Current User    Types: Snuff  . Tobacco comment: daily since 53 years old  Substance and Sexual Activity  . Alcohol use: Yes    Alcohol/week: 0.0 standard drinks    Comment: beer - daily 1/2 a case   . Drug use: No  . Sexual activity: Not Currently  Lifestyle  . Physical activity:    Days per week: Not on file    Minutes per session: Not on file  . Stress: Not on file  Relationships  . Social connections:    Talks on phone: Not on file    Gets together: Not on file    Attends religious service: Not on file    Active member of club  or organization: Not on file    Attends meetings of clubs or organizations: Not on file    Relationship status: Not on file  . Intimate partner violence:    Fear of current or ex partner: Not on file    Emotionally abused: Not on file    Physically abused: Not on file    Forced sexual activity: Not on file  Other Topics Concern  . Not on file  Social History Narrative   Live in shelter - [employer]     No family history on file.  Past Surgical History:  Procedure Laterality Date  . FOOT SURGERY    . KNEE SURGERY      ROS: Review of Systems Negative except as above PHYSICAL EXAM: BP (!) 135/98 (BP Location: Right Arm, Cuff Size: Normal) Comment: recheck  Pulse 99   Temp 98.1 F (36.7 C) (Oral)   Resp 16   Ht 5' 6"  (1.676 m)   Wt 204 lb (92.5 kg)   SpO2 96%   BMI 32.93 kg/m   Physical Exam  General appearance - alert, well appearing, and in no distress Mental status - normal mood, behavior, speech, dress, motor activity, and thought processes Mouth - mucous membranes moist, pharynx normal without lesions Neck -no cervical lymphadenopathy.  No thyroid enlargement. Chest - clear to auscultation, no wheezes, rales or rhonchi, symmetric air entry Heart - normal rate, regular rhythm, normal S1, S2, no murmurs, rubs, clicks or gallops Neurological -power in left upper extremity 5/5 proximal and distal.  Right upper extremity evaluation of power is limited as patient buckles to pain from his neck.  Gross sensation intact bilaterally in both upper extremities. Musculoskeletal -mild tenderness on palpation of right-sided para cervical muscles.  Discomfort with rotation of the neck to the right. Extremities - peripheral pulses normal, no pedal edema, no clubbing or cyanosis  BS 240 A1C 10.7  Results for orders placed or performed in visit on 12/05/18  POCT glucose (manual entry)  Result Value Ref Range   POC Glucose 240 (A) 70 - 99 mg/dl  POCT glycosylated hemoglobin (Hb  A1C)  Result Value Ref Range   Hemoglobin A1C     HbA1c POC (<> result, manual entry)     HbA1c, POC (prediabetic range)     HbA1c, POC (controlled diabetic range) 10.7 (A) 0.0 - 7.0 %    ASSESSMENT AND PLAN: 1. Cervical radiculopathy Refill meloxicam.  Change gabapentin to Lyrica - meloxicam (MOBIC) 15 MG tablet; Take 1 tablet (15 mg total) by mouth daily as needed for  pain. Take with food.  Dispense: 30 tablet; Refill: 6 - MR Cervical Spine Wo Contrast; Future - pregabalin (LYRICA) 50 MG capsule; Take 1 capsule (50 mg total) by mouth 2 (two) times daily.  Dispense: 60 capsule; Refill: 2  2. Type 2 diabetes mellitus without complication, without long-term current use of insulin (HCC) Not at goal. Discussed the importance of healthy eating habits, regular aerobic exercise (at least 150 minutes a week as tolerated) and medication compliance to achieve or maintain control of diabetes. Refill metformin to the dose that he is supposed to be taking which is 1 g twice a day. - POCT glucose (manual entry) - POCT glycosylated hemoglobin (Hb A1C) - Microalbumin / creatinine urine ratio - CBC - Comprehensive metabolic panel - Lipid panel - metFORMIN (GLUCOPHAGE) 1000 MG tablet; Take 1 tablet (1,000 mg total) by mouth 2 (two) times daily with a meal.  Dispense: 60 tablet; Refill: 6  3. Essential hypertension Not at goal.  Refill Cozaar - losartan (COZAAR) 50 MG tablet; Take 1 tablet (50 mg total) by mouth daily.  Dispense: 30 tablet; Refill: 6  4. Chews tobacco Advised to quit.  Discussed health risks associated with tobacco use  5. Alcohol use disorder, severe, in early remission ([place]) Commended him on abstinence.  Encouraged him to continue to remain sober  6. Homeless   Patient was given the opportunity to ask questions.  Patient verbalized understanding of the plan and was able to repeat key elements of the plan.   Orders Placed This Encounter  Procedures  . MR Cervical Spine Wo  Contrast  . Microalbumin / creatinine urine ratio  . CBC  . Comprehensive metabolic panel  . Lipid panel  . POCT glucose (manual entry)  . POCT glycosylated hemoglobin (Hb A1C)     Requested Prescriptions   Signed Prescriptions Disp Refills  . meloxicam (MOBIC) 15 MG tablet 30 tablet 6    Sig: Take 1 tablet (15 mg total) by mouth daily as needed for pain. Take with food.  Marland Kitchen losartan (COZAAR) 50 MG tablet 30 tablet 6    Sig: Take 1 tablet (50 mg total) by mouth daily.  . metFORMIN (GLUCOPHAGE) 1000 MG tablet 60 tablet 6    Sig: Take 1 tablet (1,000 mg total) by mouth 2 (two) times daily with a meal.  . pregabalin (LYRICA) 50 MG capsule 60 capsule 2    Sig: Take 1 capsule (50 mg total) by mouth 2 (two) times daily.    Return in about 8 weeks (around 01/30/2019).  Karle Plumber, MD, FACP

## 2018-12-05 NOTE — Progress Notes (Signed)
Pt states he has been out of bp medicine 6 months or longer

## 2018-12-06 LAB — COMPREHENSIVE METABOLIC PANEL
ALBUMIN: 4.5 g/dL (ref 3.5–5.5)
ALK PHOS: 79 IU/L (ref 39–117)
ALT: 47 IU/L — ABNORMAL HIGH (ref 0–44)
AST: 44 IU/L — ABNORMAL HIGH (ref 0–40)
Albumin/Globulin Ratio: 1.4 (ref 1.2–2.2)
BILIRUBIN TOTAL: 0.9 mg/dL (ref 0.0–1.2)
BUN / CREAT RATIO: 15 (ref 9–20)
BUN: 12 mg/dL (ref 6–24)
CHLORIDE: 99 mmol/L (ref 96–106)
CO2: 21 mmol/L (ref 20–29)
CREATININE: 0.8 mg/dL (ref 0.76–1.27)
Calcium: 9.7 mg/dL (ref 8.7–10.2)
GFR calc non Af Amer: 104 mL/min/{1.73_m2} (ref >59)
GFR, EST AFRICAN AMERICAN: 120 mL/min/{1.73_m2} (ref >59)
GLOBULIN, TOTAL: 3.2 g/dL (ref 1.5–4.5)
Glucose: 227 mg/dL — ABNORMAL HIGH (ref 65–99)
Potassium: 4.3 mmol/L (ref 3.5–5.2)
SODIUM: 141 mmol/L (ref 134–144)
TOTAL PROTEIN: 7.7 g/dL (ref 6.0–8.5)

## 2018-12-06 LAB — CBC
HEMATOCRIT: 44.4 % (ref 37.5–51.0)
HEMOGLOBIN: 15.4 g/dL (ref 13.0–17.7)
MCH: 32 pg (ref 26.6–33.0)
MCHC: 34.7 g/dL (ref 31.5–35.7)
MCV: 92 fL (ref 79–97)
Platelets: 183 10*3/uL (ref 150–450)
RBC: 4.82 x10E6/uL (ref 4.14–5.80)
RDW: 12.7 % (ref 11.6–15.4)
WBC: 9.1 10*3/uL (ref 3.4–10.8)

## 2018-12-06 LAB — LIPID PANEL
CHOLESTEROL TOTAL: 218 mg/dL — AB (ref 100–199)
Chol/HDL Ratio: 3.9 ratio (ref 0.0–5.0)
HDL: 56 mg/dL (ref >39)
LDL Calculated: 128 mg/dL — ABNORMAL HIGH (ref 0–99)
Triglycerides: 168 mg/dL — ABNORMAL HIGH (ref 0–149)
VLDL Cholesterol Cal: 34 mg/dL (ref 5–40)

## 2018-12-06 LAB — MICROALBUMIN / CREATININE URINE RATIO
Creatinine, Urine: 333.2 mg/dL
Microalb/Creat Ratio: 113.6 mg/g creat — ABNORMAL HIGH (ref 0.0–30.0)
Microalbumin, Urine: 378.5 ug/mL

## 2018-12-07 ENCOUNTER — Other Ambulatory Visit: Payer: Self-pay | Admitting: Internal Medicine

## 2018-12-07 MED ORDER — ATORVASTATIN CALCIUM 10 MG PO TABS: 10.0000 mg | ORAL_TABLET | Freq: Every day | ORAL | 1 refills | Status: DC

## 2018-12-08 MED FILL — ATORVASTATIN 10 MG TABLET: 10 | 30 days supply | Qty: 30 | Fill #0

## 2018-12-15 ENCOUNTER — Ambulatory Visit (HOSPITAL_COMMUNITY): Admission: RE | Admit: 2018-12-15 | Payer: Self-pay | Source: Ambulatory Visit

## 2018-12-16 ENCOUNTER — Telehealth: Payer: Self-pay | Admitting: Internal Medicine

## 2018-12-16 ENCOUNTER — Telehealth: Payer: Self-pay

## 2018-12-16 ENCOUNTER — Other Ambulatory Visit: Payer: Self-pay | Admitting: Internal Medicine

## 2018-12-16 MED ORDER — DIAZEPAM 2 MG PO TABS: ORAL_TABLET | ORAL | 0 refills | Status: DC

## 2018-12-16 NOTE — Telephone Encounter (Signed)
Could you please contact pt and make him aware that he can call and reschedule his MRI. The number is on his avs from last visit.   If pt doesn't have the number it is [phone]  Also could you let pt know that his valium rx is ready for pick up at the front desk. He will need to take rx to outside pharmacy.

## 2018-12-16 NOTE — Telephone Encounter (Signed)
Contacted patient a notified of message. Patient understood and says he will be sending someone to pick up his prescription.

## 2018-12-16 NOTE — Telephone Encounter (Signed)
Contacted pt to go over lab results pt is aware of results  Pt missed MRI appointment that was scheduled for 12/15/18. Contacted the scheduling department and got MRI rescheduled for 12/18/18 @5pm  @[facility]. Pt is to arrive @430pm .  Pt is aware of new appointment MRI and doesn't have any questions or concerns

## 2018-12-16 NOTE — Telephone Encounter (Signed)
Patient called in to reschedule appt for his mri, stated that his appt has to be before 12pm because of transportation  Please follow up

## 2018-12-18 ENCOUNTER — Telehealth: Payer: Self-pay | Admitting: Internal Medicine

## 2018-12-18 ENCOUNTER — Ambulatory Visit (HOSPITAL_COMMUNITY): Payer: Self-pay

## 2018-12-18 NOTE — Telephone Encounter (Signed)
Patient called because he would like to get a copy of his documents that he submitted with his [facility] application please follow up.

## 2019-01-05 ENCOUNTER — Ambulatory Visit (HOSPITAL_COMMUNITY): Admission: RE | Admit: 2019-01-05 | Payer: Self-pay | Source: Ambulatory Visit

## 2019-01-30 ENCOUNTER — Encounter: Payer: Self-pay | Admitting: Internal Medicine

## 2019-01-30 ENCOUNTER — Ambulatory Visit: Payer: Self-pay | Attending: Internal Medicine | Admitting: Internal Medicine

## 2019-01-30 VITALS — BP 114/79 | HR 99 | Temp 97.7°F | Resp 16 | Wt 210.0 lb

## 2019-01-30 DIAGNOSIS — E782 Mixed hyperlipidemia: Secondary | ICD-10-CM

## 2019-01-30 DIAGNOSIS — R809 Proteinuria, unspecified: Secondary | ICD-10-CM

## 2019-01-30 DIAGNOSIS — IMO0001 Reserved for inherently not codable concepts without codable children: Secondary | ICD-10-CM

## 2019-01-30 DIAGNOSIS — F1021 Alcohol dependence, in remission: Secondary | ICD-10-CM

## 2019-01-30 DIAGNOSIS — I10 Essential (primary) hypertension: Secondary | ICD-10-CM

## 2019-01-30 DIAGNOSIS — R945 Abnormal results of liver function studies: Secondary | ICD-10-CM

## 2019-01-30 DIAGNOSIS — R7989 Other specified abnormal findings of blood chemistry: Secondary | ICD-10-CM

## 2019-01-30 DIAGNOSIS — E1165 Type 2 diabetes mellitus with hyperglycemia: Secondary | ICD-10-CM

## 2019-01-30 LAB — GLUCOSE, POCT (MANUAL RESULT ENTRY): POC GLUCOSE: 283 mg/dL — AB (ref 70–99)

## 2019-01-30 NOTE — Progress Notes (Signed)
Patient ID: Adrian Schultz, male    DOB: 06-29-1966  MRN: [id number]  CC: Diabetes and Hypertension   Subjective: Adrian Schultz is a 53 y.o. male who presents for chronic ds management.  Case manager from [employer], [place], is with him. His concerns today include:  Pt with hx of HTN, DM, ETOH abuse, anxiety, chronic RT shoulder pain  DM: no device to check BS.  Would like to get him DM testing supplies He did get metformin and has been taking 1000 mg twice a day consistently. He has cut back on eating both bread and potatoes.  Avoids surgery drinks  HTN; taking Losartan.  "Kinda" limiting salt No chest pains or shortness of breath.  Homeless:  Staying in a loft through [employer].   Still free of alcohol for 61 days.  Still on Naltrexone through [facility]  I went over lab results from his visit 2 months ago.  He had microalbumin.  He has mild elev in liver enzymes.  I discussed screening for hep C with him today and he is agreeable to doing that.  Reportedly had HIV test done about 1 year ago [facility].  It was negative.  He does not desire repeat testing at this time.  He has history of blood transfusion back in 1997.  He has several tattoos over his body. He also had elevation of LDL cholesterol.  MRI of cervical spine ordered on last visit but patient did not have it done.  Patient Active Problem List   Diagnosis Date Noted  . Microalbuminuria 01/30/2019  . Alcohol use disorder, severe, in early remission ([place]) 12/05/2018  . Chronic pain of left knee 05/08/2018  . Primary localized osteoarthritis of left knee 12/27/2017  . Cervicalgia 12/27/2017  . Anxiety 12/27/2017  . History of panic attacks 12/27/2017  . Chronic right shoulder pain 12/27/2017  . Essential hypertension 10/04/2017  . Alcohol abuse with alcohol-induced mood disorder ([place]) 09/06/2017  . Hypercholesteremia 07/04/2017  . Bronchiectasis ([place]) 06/17/2017  . Type 2  diabetes mellitus without complication, without long-term current use of insulin ([place]) 04/05/2017     Current Outpatient Medications on File Prior to Visit  Medication Sig Dispense Refill  . atorvastatin (LIPITOR) 10 MG tablet Take 1 tablet (10 mg total) by mouth daily. 30 tablet 1  . Blood Glucose Monitoring Suppl (TRUE METRIX METER) w/Device KIT 1 each by Does not apply route daily. 1 kit 0  . diazepam (VALIUM) 2 MG tablet Take 1 tab 1/2 hr to 1 hr prior to MRI to decrease claustaphobia 1 tablet 0  . escitalopram (LEXAPRO) 20 MG tablet Take 1 tablet (20 mg total) by mouth daily. 30 tablet 3  . glucose blood (TRUE METRIX BLOOD GLUCOSE TEST) test strip Use as instructed 100 each 12  . glucose blood (TRUE METRIX BLOOD GLUCOSE TEST) test strip Use as instructed 100 each 12  . losartan (COZAAR) 50 MG tablet Take 1 tablet (50 mg total) by mouth daily. 30 tablet 6  . meloxicam (MOBIC) 15 MG tablet Take 1 tablet (15 mg total) by mouth daily as needed for pain. Take with food. 30 tablet 6  . metFORMIN (GLUCOPHAGE) 1000 MG tablet Take 1 tablet (1,000 mg total) by mouth 2 (two) times daily with a meal. 60 tablet 6  . pregabalin (LYRICA) 50 MG capsule Take 1 capsule (50 mg total) by mouth 2 (two) times daily. 60 capsule 2  . TRUEPLUS LANCETS 28G MISC Use as  directed 100 each 4   No current facility-administered medications on file prior to visit.     Allergies  Allergen Reactions  . Lisinopril Cough    Social History   Socioeconomic History  . Marital status: Divorced    Spouse name: Not on file  . Number of children: Not on file  . Years of education: Not on file  . Highest education level: Not on file  Occupational History  . Occupation: Unemployeed  Social Needs  . Financial resource strain: Not on file  . Food insecurity:    Worry: Not on file    Inability: Not on file  . Transportation needs:    Medical: Not on file    Non-medical: Not on file  Tobacco Use  . Smoking status:  Never Smoker  . Smokeless tobacco: Current User    Types: Snuff  . Tobacco comment: daily since 53 years old  Substance and Sexual Activity  . Alcohol use: Yes    Alcohol/week: 0.0 standard drinks    Comment: beer - daily 1/2 a case   . Drug use: No  . Sexual activity: Not Currently  Lifestyle  . Physical activity:    Days per week: Not on file    Minutes per session: Not on file  . Stress: Not on file  Relationships  . Social connections:    Talks on phone: Not on file    Gets together: Not on file    Attends religious service: Not on file    Active member of club or organization: Not on file    Attends meetings of clubs or organizations: Not on file    Relationship status: Not on file  . Intimate partner violence:    Fear of current or ex partner: Not on file    Emotionally abused: Not on file    Physically abused: Not on file    Forced sexual activity: Not on file  Other Topics Concern  . Not on file  Social History Narrative   Live in shelter - [employer]     No family history on file.  Past Surgical History:  Procedure Laterality Date  . FOOT SURGERY    . KNEE SURGERY      ROS: Review of Systems Negative except as stated above  PHYSICAL EXAM: BP 114/79   Pulse 99   Temp 97.7 F (36.5 C) (Oral)   Resp 16   Wt 210 lb (95.3 kg)   SpO2 92%   BMI 33.89 kg/m   Physical Exam  General appearance - alert, well appearing, and in no distress Mental status - normal mood, behavior, speech, dress, motor activity, and thought processes Neck - supple, no significant adenopathy Chest - clear to auscultation, no wheezes, rales or rhonchi, symmetric air entry Heart - normal rate, regular rhythm, normal S1, S2, no murmurs, rubs, clicks or gallops Abdomen - soft, nontender, nondistended, no masses or organomegaly Extremities - no LE edema Skin: Tattoos over the extremities. Results for orders placed or performed in visit on 01/30/19  POCT glucose (manual entry)    Result Value Ref Range   POC Glucose 283 (A) 70 - 99 mg/dl     CMP Latest Ref Rng & Units 12/05/2018 03/07/2018 09/05/2017  Glucose 65 - 99 mg/dL 227(H) 245(H) 199(H)  BUN 6 - 24 mg/dL _0 Creatinine 0.76 - 1.27 mg/dL 0.80 1.03 0.69  Sodium 134 - 144 mmol/L 141 131(L) 136  Potassium 3.5 - 5.2 mmol/L 4.3  4.1 3.6  Chloride 96 - 106 mmol/L 99 97(L) 99(L)  CO2 20 - 29 mmol/L 21 20(L) 26  Calcium 8.7 - 10.2 mg/dL 9.7 8.6(L) 8.8(L)  Total Protein 6.0 - 8.5 g/dL 7.7 - 8.6(H)  Total Bilirubin 0.0 - 1.2 mg/dL 0.9 - 1.1  Alkaline Phos 39 - 117 IU/L 79 - 104  AST 0 - 40 IU/L 44(H) - 23  ALT 0 - 44 IU/L 47(H) - 25   Lipid Panel     Component Value Date/Time   CHOL 218 (H) 12/05/2018 1228   TRIG 168 (H) 12/05/2018 1228   HDL 56 12/05/2018 1228   CHOLHDL 3.9 12/05/2018 1228   LDLCALC 128 (H) 12/05/2018 1228    CBC    Component Value Date/Time   WBC 9.1 12/05/2018 1228   WBC 8.0 03/07/2018 0016   RBC 4.82 12/05/2018 1228   RBC 4.20 (L) 03/07/2018 0016   HGB 15.4 12/05/2018 1228   HCT 44.4 12/05/2018 1228   PLT 183 12/05/2018 1228   MCV 92 12/05/2018 1228   MCH 32.0 12/05/2018 1228   MCH 33.1 03/07/2018 0016   MCHC 34.7 12/05/2018 1228   MCHC 34.7 03/07/2018 0016   RDW 12.7 12/05/2018 1228   LYMPHSABS 2.8 03/07/2018 0016   MONOABS 0.4 03/07/2018 0016   EOSABS 0.2 03/07/2018 0016   BASOSABS 0.0 03/07/2018 0016    ASSESSMENT AND PLAN:  1. Diabetes mellitus type 2, uncontrolled, without complications (HCC) Level of control unknown as pt has no device to check BS.   Sent rxn to pharmacy for testing supplies Commended him on cutting back on white carbohydrates and sugar drinks Needs diabetic eye exam but has no insurance - POCT glucose (manual entry)  2. Microalbuminuria We will plan to recheck urine in several months.  He is on Cozaar.  3. Essential hypertension Controlled on Cozaar.  Encouraged him to continue to limit salt as much as possible.  4. Mixed  hyperlipidemia Would benefit from being on a statin.  We will recheck LFTs first and hepatitis C screening  5. Alcohol use disorder, severe, in early remission ([place]) Commended him on trying to be committed to abstinence  6. Abnormal LFTs - Hepatitis C Antibody - Hepatic Function Panel   Patient was given the opportunity to ask questions.  Patient verbalized understanding of the plan and was able to repeat key elements of the plan.   Orders Placed This Encounter  Procedures  . Hepatitis C Antibody  . Hepatic Function Panel  . POCT glucose (manual entry)     Requested Prescriptions    No prescriptions requested or ordered in this encounter    Return in about 3 months (around 05/02/2019).  Karle Plumber, MD, FACP

## 2019-01-30 NOTE — Progress Notes (Signed)
Pt states his left ankle and right shoulder is part of his pain

## 2019-01-31 LAB — HEPATIC FUNCTION PANEL
ALK PHOS: 98 IU/L (ref 39–117)
ALT: 23 IU/L (ref 0–44)
AST: 21 IU/L (ref 0–40)
Albumin: 4.4 g/dL (ref 4.0–5.0)
Bilirubin Total: 0.4 mg/dL (ref 0.0–1.2)
Bilirubin, Direct: 0.14 mg/dL (ref 0.00–0.40)
Total Protein: 7.6 g/dL (ref 6.0–8.5)

## 2019-01-31 LAB — HEPATITIS C ANTIBODY: Hep C Virus Ab: <0.1 s/co ratio (ref 0.0–0.9)

## 2019-02-02 MED FILL — MELOXICAM 15 MG TABLET: 15 | 30 days supply | Qty: 30 | Fill #2

## 2019-02-06 ENCOUNTER — Telehealth: Payer: Self-pay

## 2019-02-06 NOTE — Telephone Encounter (Signed)
Contacted pt to go over lab results pt didn't answer left a detailed vm informing pt of results and if he has any questions or concerns to give me a call  

## 2019-02-09 MED FILL — TRUE METRIX TEST STRIP: 30 days supply | Qty: 100 | Fill #1

## 2019-02-20 MED FILL — MELOXICAM 15 MG TABLET: 15 | 30 days supply | Qty: 30 | Fill #3

## 2019-02-20 MED FILL — TRUE METRIX TEST STRIP: 30 days supply | Qty: 100 | Fill #2

## 2019-02-27 ENCOUNTER — Telehealth: Payer: Self-pay | Admitting: Internal Medicine

## 2019-02-27 DIAGNOSIS — M5412 Radiculopathy, cervical region: Secondary | ICD-10-CM

## 2019-02-27 MED ORDER — PREGABALIN 50 MG PO CAPS: 50.0000 mg | ORAL_CAPSULE | Freq: Two times a day (BID) | ORAL | 2 refills | Status: DC

## 2019-02-27 NOTE — Telephone Encounter (Signed)
Patient called requesting a refill on pregabalin (LYRICA) 50 MG capsule  Please f/u

## 2019-03-02 NOTE — Telephone Encounter (Signed)
Contacted pt and made aware that rx is ready for pick up. Pt states he wants the rx to be filled here. I informed pt that we don't fill Lyrica and that it will be through the patient assistance program. I went to speak with Nehemiah Settle in pharmacy regarding rx. Nehemiah Settle stated that it will be for Tresa Endo and she will be in Thursday. I informed pt that Tresa Endo isn't here and she will be in Thursday and I will find out from her the process. Pt states he understands and he doesn't have any questions or concerns

## 2019-03-16 MED FILL — TRUE METRIX TEST STRIP: 30 days supply | Qty: 100 | Fill #3

## 2019-03-26 ENCOUNTER — Telehealth: Payer: Self-pay | Admitting: Internal Medicine

## 2019-03-26 NOTE — Telephone Encounter (Signed)
Pt called requesting a prescription for valium, patients states he is in a very anxious state and the pharmacy said he would most likely not receive his lyrica. Please follow up

## 2019-03-27 NOTE — Telephone Encounter (Signed)
Spoke with Tresa Endo in the pharmacy and she stated she has spoken with the pt regarding his Lyrica. Tresa Endo also stated she has faxed paperwork this morning

## 2019-03-27 NOTE — Telephone Encounter (Signed)
Please schedule pt a televisit  

## 2019-03-27 NOTE — Telephone Encounter (Signed)
Pt has a televisit Monday

## 2019-03-30 ENCOUNTER — Ambulatory Visit: Payer: Self-pay | Attending: Internal Medicine | Admitting: Internal Medicine

## 2019-03-30 ENCOUNTER — Ambulatory Visit (HOSPITAL_BASED_OUTPATIENT_CLINIC_OR_DEPARTMENT_OTHER): Payer: Self-pay | Admitting: Licensed Clinical Social Worker

## 2019-03-30 ENCOUNTER — Other Ambulatory Visit: Payer: Self-pay

## 2019-03-30 DIAGNOSIS — F1021 Alcohol dependence, in remission: Secondary | ICD-10-CM

## 2019-03-30 DIAGNOSIS — E1165 Type 2 diabetes mellitus with hyperglycemia: Secondary | ICD-10-CM

## 2019-03-30 DIAGNOSIS — F411 Generalized anxiety disorder: Secondary | ICD-10-CM

## 2019-03-30 DIAGNOSIS — F419 Anxiety disorder, unspecified: Secondary | ICD-10-CM

## 2019-03-30 DIAGNOSIS — IMO0001 Reserved for inherently not codable concepts without codable children: Secondary | ICD-10-CM

## 2019-03-30 MED ORDER — GLUCOSE BLOOD VI STRP: ORAL_STRIP | 12 refills | Status: DC

## 2019-03-30 MED ORDER — BUSPIRONE HCL 5 MG PO TABS: 5.0000 mg | ORAL_TABLET | Freq: Two times a day (BID) | ORAL | 1 refills | Status: DC

## 2019-03-30 MED FILL — busPIRone HCL 5 MG TABS: 5 | 30 days supply | Qty: 60 | Fill #0

## 2019-03-30 NOTE — Progress Notes (Signed)
Virtual Visit via Telephone Note  I connected with Adrian Schultz on 03/30/19 at 3:26 p.m EDT by telephone from my office and verified that I am speaking with the correct person using two identifiers. Pt is at a friend's house.  Only pt and myself participated in this encounter   I discussed the limitations, risks, security and privacy concerns of performing an evaluation and management service by telephone and the availability of in person appointments. I also discussed with the patient that there may be a patient responsible charge related to this service. The patient expressed understanding and agreed to proceed.   History of Present Illness: Pt with hx of HTN, DM with neuropathy, ETOH abuse, anxiety, chronic RT shoulder pain   Having problems getting Lyrica.  Pt states he filled out all the forms at our pharmacy to get it through the PAP but it has not reached as yet.  My CMA spoke with the pharmacy last week and was told that his forms were just faxed off last week.  Does not tolerate Gapapentin  Wants to get on Valium for anxiety.  I had prescribed a one-time dose for him earlier this year for him to take prior to getting an MRI done.  He never did get the MRI done.  However he did take the Valium and feels it works better than Lexapro.  Still taking Lexapro."but it seems to not be doing anything."  I feel like someone always after me when I walk down the road." Intermittent homelessness contributes to his anxiety.  Does painting to help keep himself  calm.   ETOH:  Still on Naltrexone which he finds helpful.  Gets this through [place] but has to pay $5 for it.  He has been sober for 151 days.    Request rxn for stripes for his glucometer   Observations/Objective: No direct observation done as this was a telephone encounter  Assessment and Plan: 1. Generalized anxiety disorder Advised patient that I do not think putting him on volume long-term is a good idea as the medication can become  habit forming.  He is agreeable to trying BuSpar. - busPIRone (BUSPAR) 5 MG tablet; Take 1 tablet (5 mg total) by mouth 2 (two) times daily.  Dispense: 60 tablet; Refill: 1  2. Diabetes mellitus type 2, uncontrolled, without complications (HCC) I did have my CMA touch base with the pharmacy again today about his Lyrica.  It will take several weeks for the medication to come in - glucose blood (TRUE METRIX BLOOD GLUCOSE TEST) test strip; Use as instructed  Dispense: 100 each; Refill: 12  3. Alcohol use disorder, severe, in early remission (HCC) Commended him on his sobriety.  He will let me know if he needs to get the naltrexone through our pharmacy   Follow Up Instructions: Patient has a follow-up appointment scheduled next month   I discussed the assessment and treatment plan with the patient. The patient was provided an opportunity to ask questions and all were answered. The patient agreed with the plan and demonstrated an understanding of the instructions.   The patient was advised to call back or seek an in-person evaluation if the symptoms worsen or if the condition fails to improve as anticipated.  I provided 18 minutes of non-face-to-face time during this encounter.   Jonah Blue, MD

## 2019-03-30 NOTE — Progress Notes (Signed)
Pt is requesting valium   Pt states is he wanting to speak with Dr. Laural Benes about his lyrica   Pt states the mobic is not working anymore

## 2019-04-06 NOTE — BH Specialist Note (Signed)
Integrated Behavioral Health Visit via Telemedicine (Telephone)  03/30/2019 Adrian Schultz [id number]   Session Start time: 2:27 PM  Session End time: 2:52 PM Total time: 25 minutes  Referring Provider: Dr. Laural Benes Type of Visit: Telephonic Patient location: [facility] Provider location: Office All persons participating in visit: Pt  Confirmed patient's address: Yes  Confirmed patient's phone number: Yes  Any changes to demographics: No   Confirmed patient's insurance: Yes  Any changes to patient's insurance: No   Discussed confidentiality: Yes    The following statements were read to the patient and/or legal guardian that are established with the [facility] Provider.  "The purpose of this phone visit is to provide behavioral health care while limiting exposure to the coronavirus (COVID19).  There is a possibility of technology failure and discussed alternative modes of communication if that failure occurs."  "By engaging in this telephone visit, you consent to the provision of healthcare.  Additionally, you authorize for your insurance to be billed for the services provided during this telephone visit."   Patient and/or legal guardian consented to telephone visit: Yes   PRESENTING CONCERNS: Patient and/or family reports the following symptoms/concerns: Pt reports difficulty managing mental health due to financial strain and inability to afford medications Duration of problem: "long long time"; Severity of problem: moderate  STRENGTHS (Protective Factors/Coping Skills): Pt has established mental health services in the community Pt participates in medication management Pt reports sobriety for approx 4 months  GOALS ADDRESSED: Patient will: 1.  Reduce symptoms of: anxiety  2.  Increase knowledge and/or ability of: coping skills and healthy habits  3.  Demonstrate ability to: Increase healthy adjustment to current life circumstances and Increase adequate support  systems for patient/family  INTERVENTIONS: Interventions utilized:  Solution-Focused Strategies, Supportive Counseling and Psychoeducation and/or Health Education Standardized Assessments completed: GAD-7 and PHQ 2&9  ASSESSMENT: Patient currently experiencing anxiety triggered by financial strain. Pt shared that he experiences an increase in anxiety around people and has been "nasty" to others. Pt denied current suicidal or homicidal ideations. He works part-time.  Patient receives behavioral health services through [facility]; however, states that he is not able to afford medications through their pharmacy. His last appointment with Vesta Mixer was last week. He also receives in home services through [facility] with [place], who is scheduled to see him this week. Healthy coping skills to assist in decrease of symptoms were discussed. Pt agreed to speak to PCP about prescribing medications to [facility] to assist with obtaining medications for free due to his active blue card.   PLAN: 1. Follow up with behavioral health clinician on : Pt was encouraged to follow up with psychotherapist with [facility] and [facility] 2. Behavioral recommendations: LCSW recommends pt comply with medication management, follow through with upcoming appointments with [facility], and speak to PCP about medication requests 3. Referral(s): [facility] (In Clinic)  Bridgett Larsson, [place] 04/06/2019 3:57 PM

## 2019-04-24 MED FILL — TRUE METRIX TEST STRIP: 25 days supply | Qty: 100 | Fill #0

## 2019-05-05 ENCOUNTER — Ambulatory Visit: Payer: Self-pay | Attending: Internal Medicine

## 2019-05-05 ENCOUNTER — Other Ambulatory Visit: Payer: Self-pay

## 2019-05-05 ENCOUNTER — Telehealth: Payer: Self-pay | Admitting: Internal Medicine

## 2019-05-05 NOTE — Telephone Encounter (Signed)
Pt was call to go over the Financial application for CAF and OC, Pt did not submitted any application or documents, he was informed that he need to submitted the documents we ned to get him approve, Pt understood and will bring the documents another time and call to reschedule the appt.

## 2019-05-11 ENCOUNTER — Ambulatory Visit: Payer: Self-pay | Admitting: Internal Medicine

## 2019-05-26 IMAGING — DX DG SHOULDER 2+V*R*
3 series · 3 of 3 positions shown · non-contrast
Comparison: None.

CLINICAL DATA: Pain for 3 months.  No trauma.

EXAM:
RIGHT SHOULDER - 2+ VIEW

[w shoulder external right]
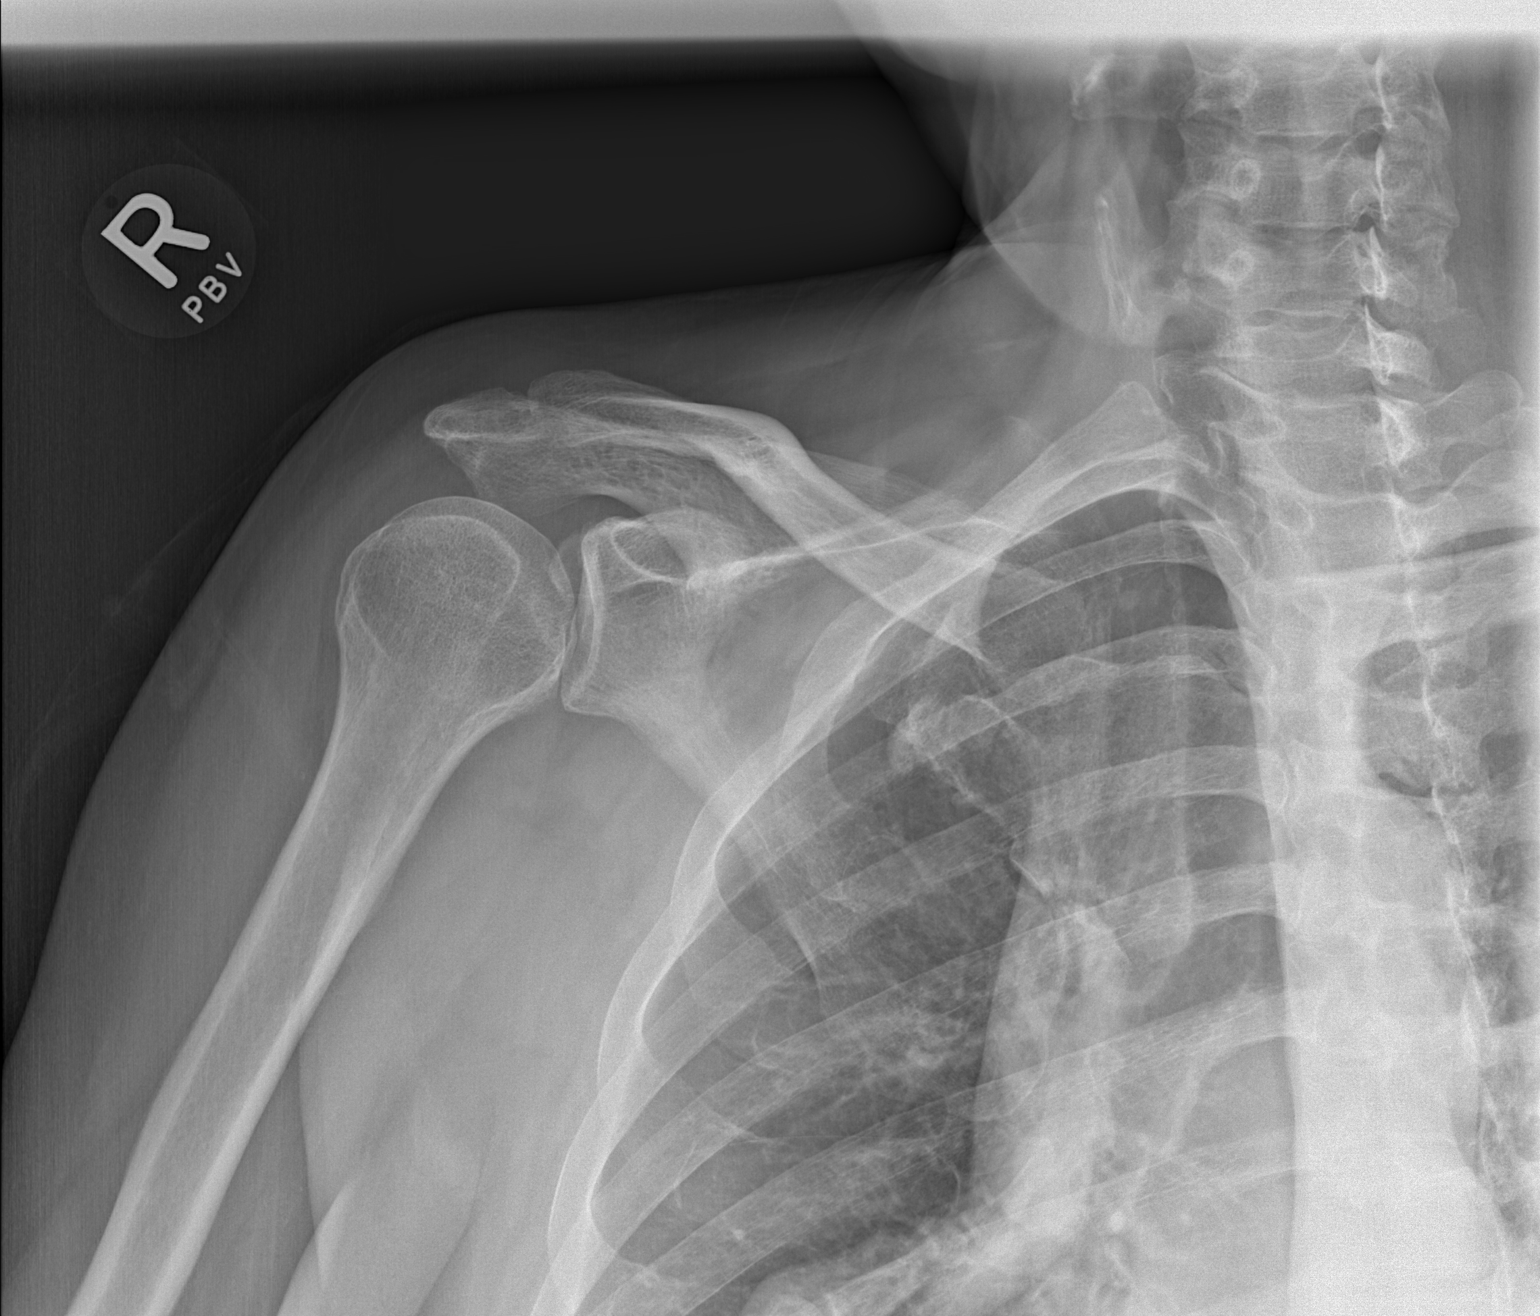

[w shoulder y-view right]
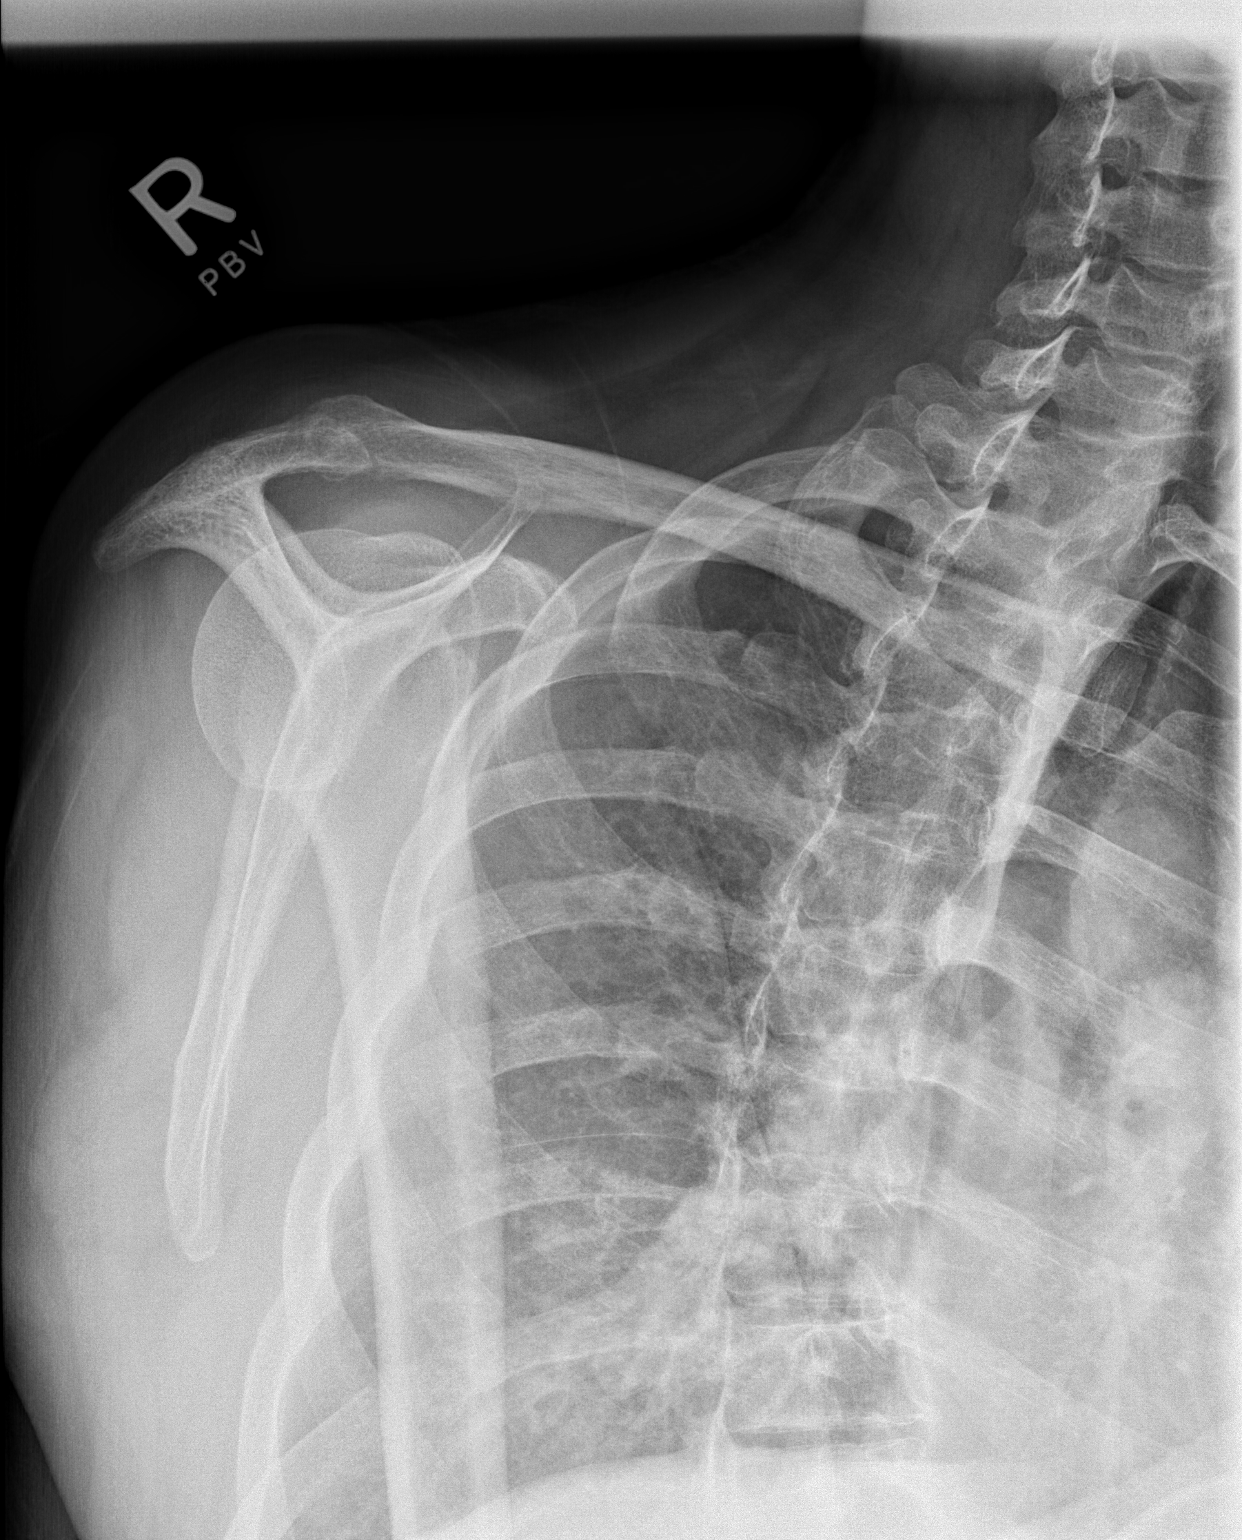

[x shoulder axillary right]
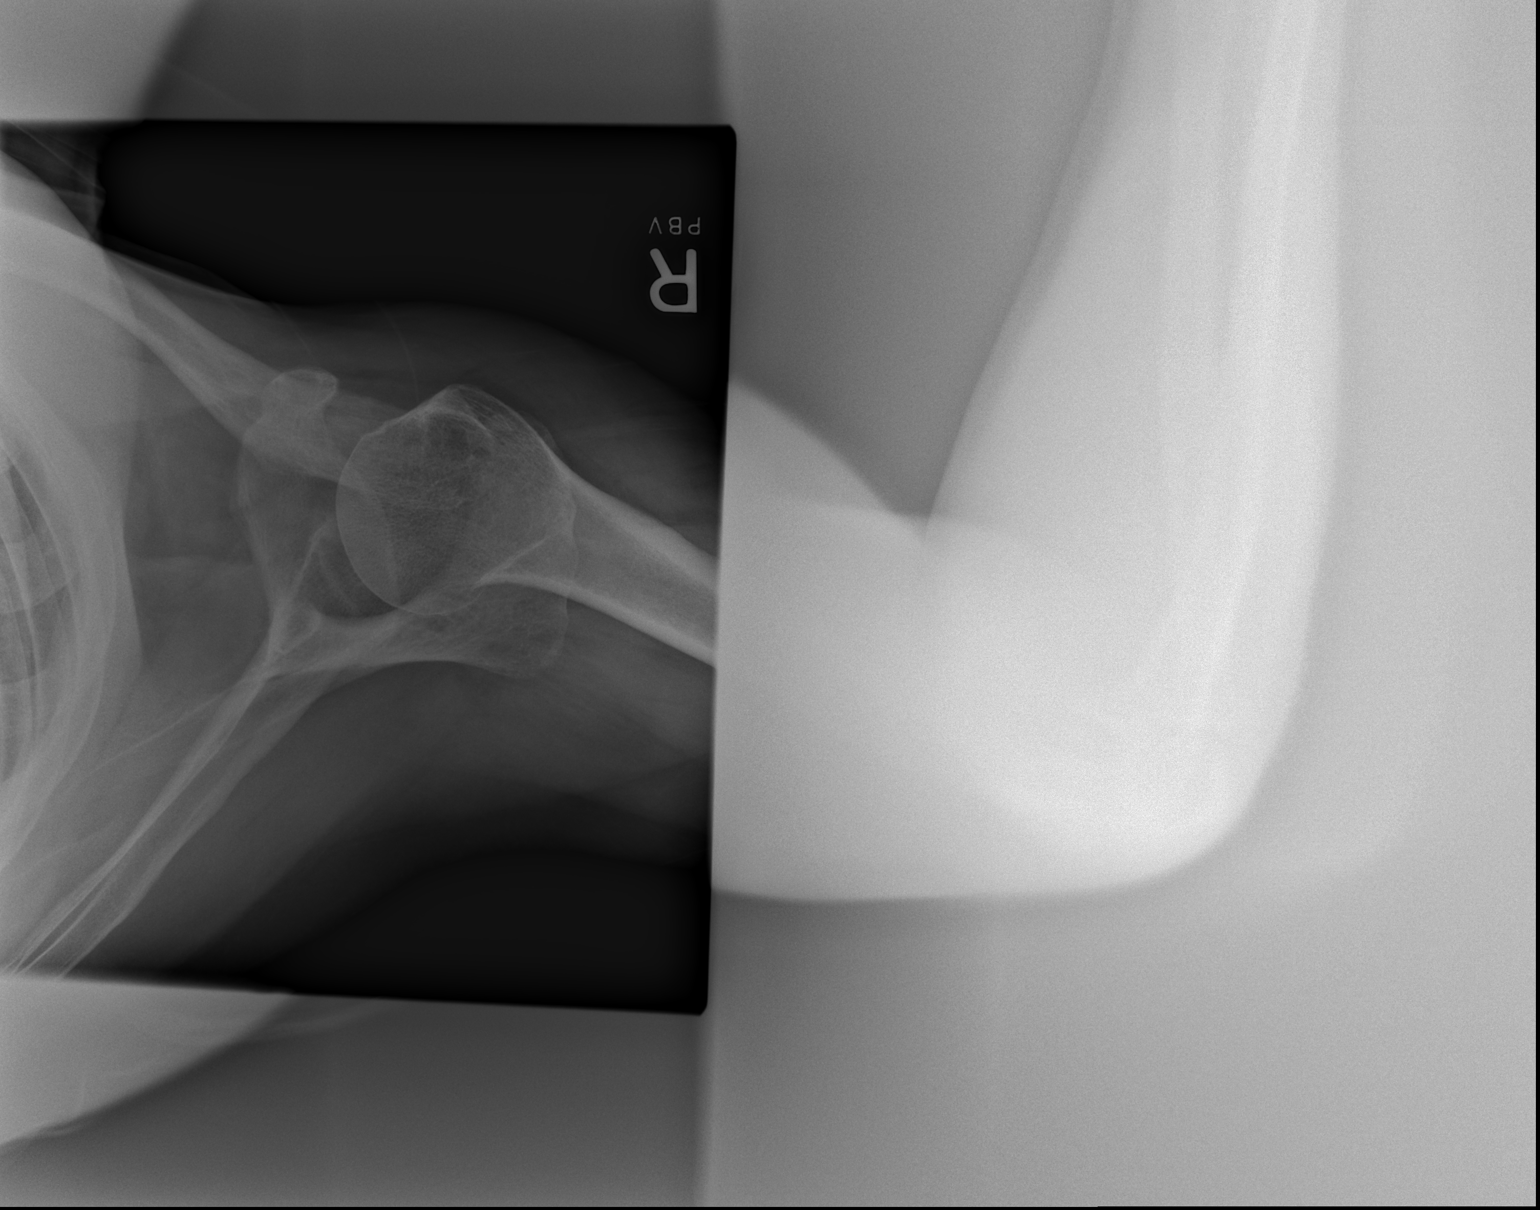

[3 of 3 positions shown; findings below may reference images not displayed]

FINDINGS: There is no evidence of fracture or dislocation. There is no
evidence of arthropathy or other focal bone abnormality. Soft
tissues are unremarkable.
IMPRESSION: Negative.

## 2019-05-26 IMAGING — DX DG CHEST 2V
2 series · 2 of 2 positions shown · non-contrast
Comparison: March 16, 2017

CLINICAL DATA: Shortness of breath and cough

EXAM:
CHEST  2 VIEW

[w chest pa]
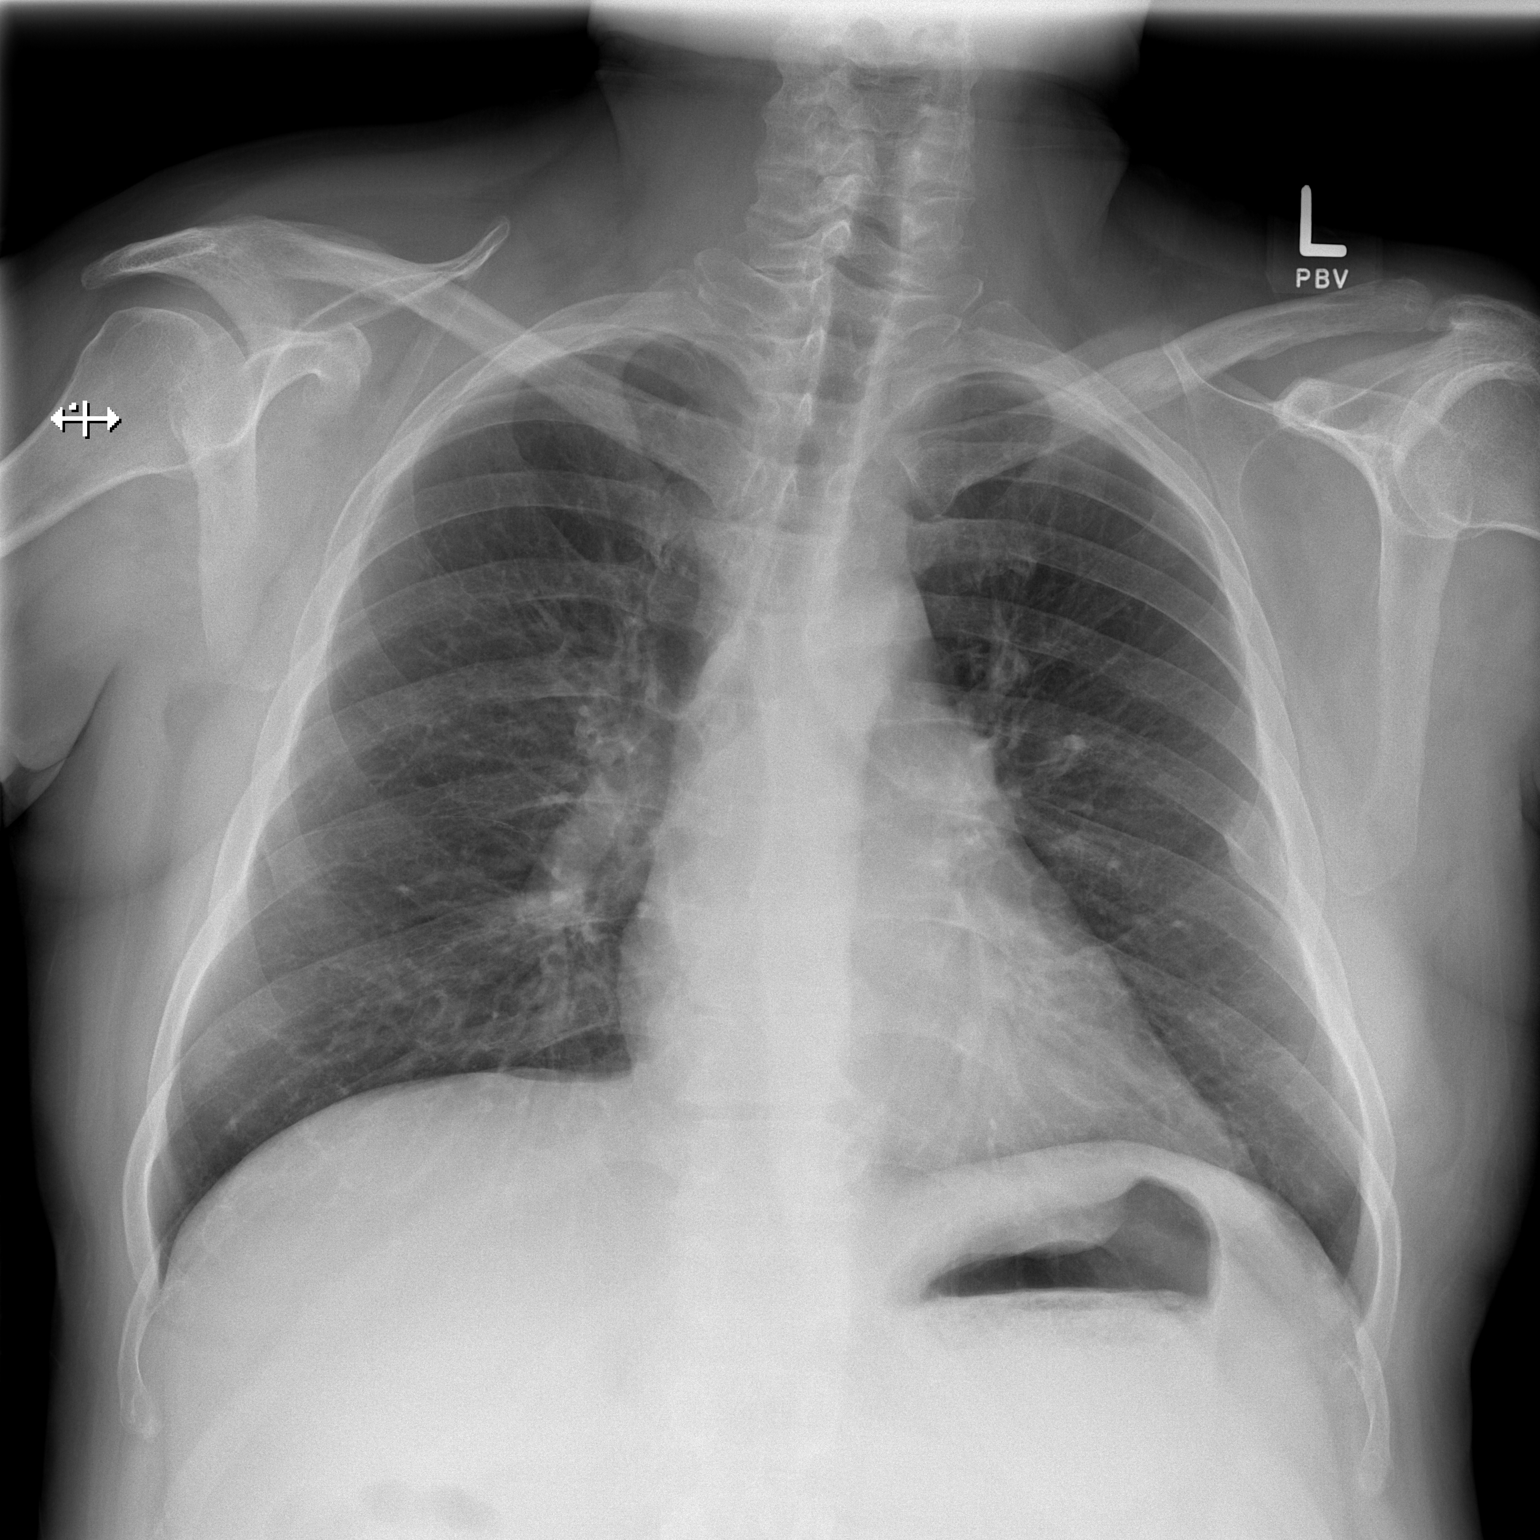

[w chest lat]
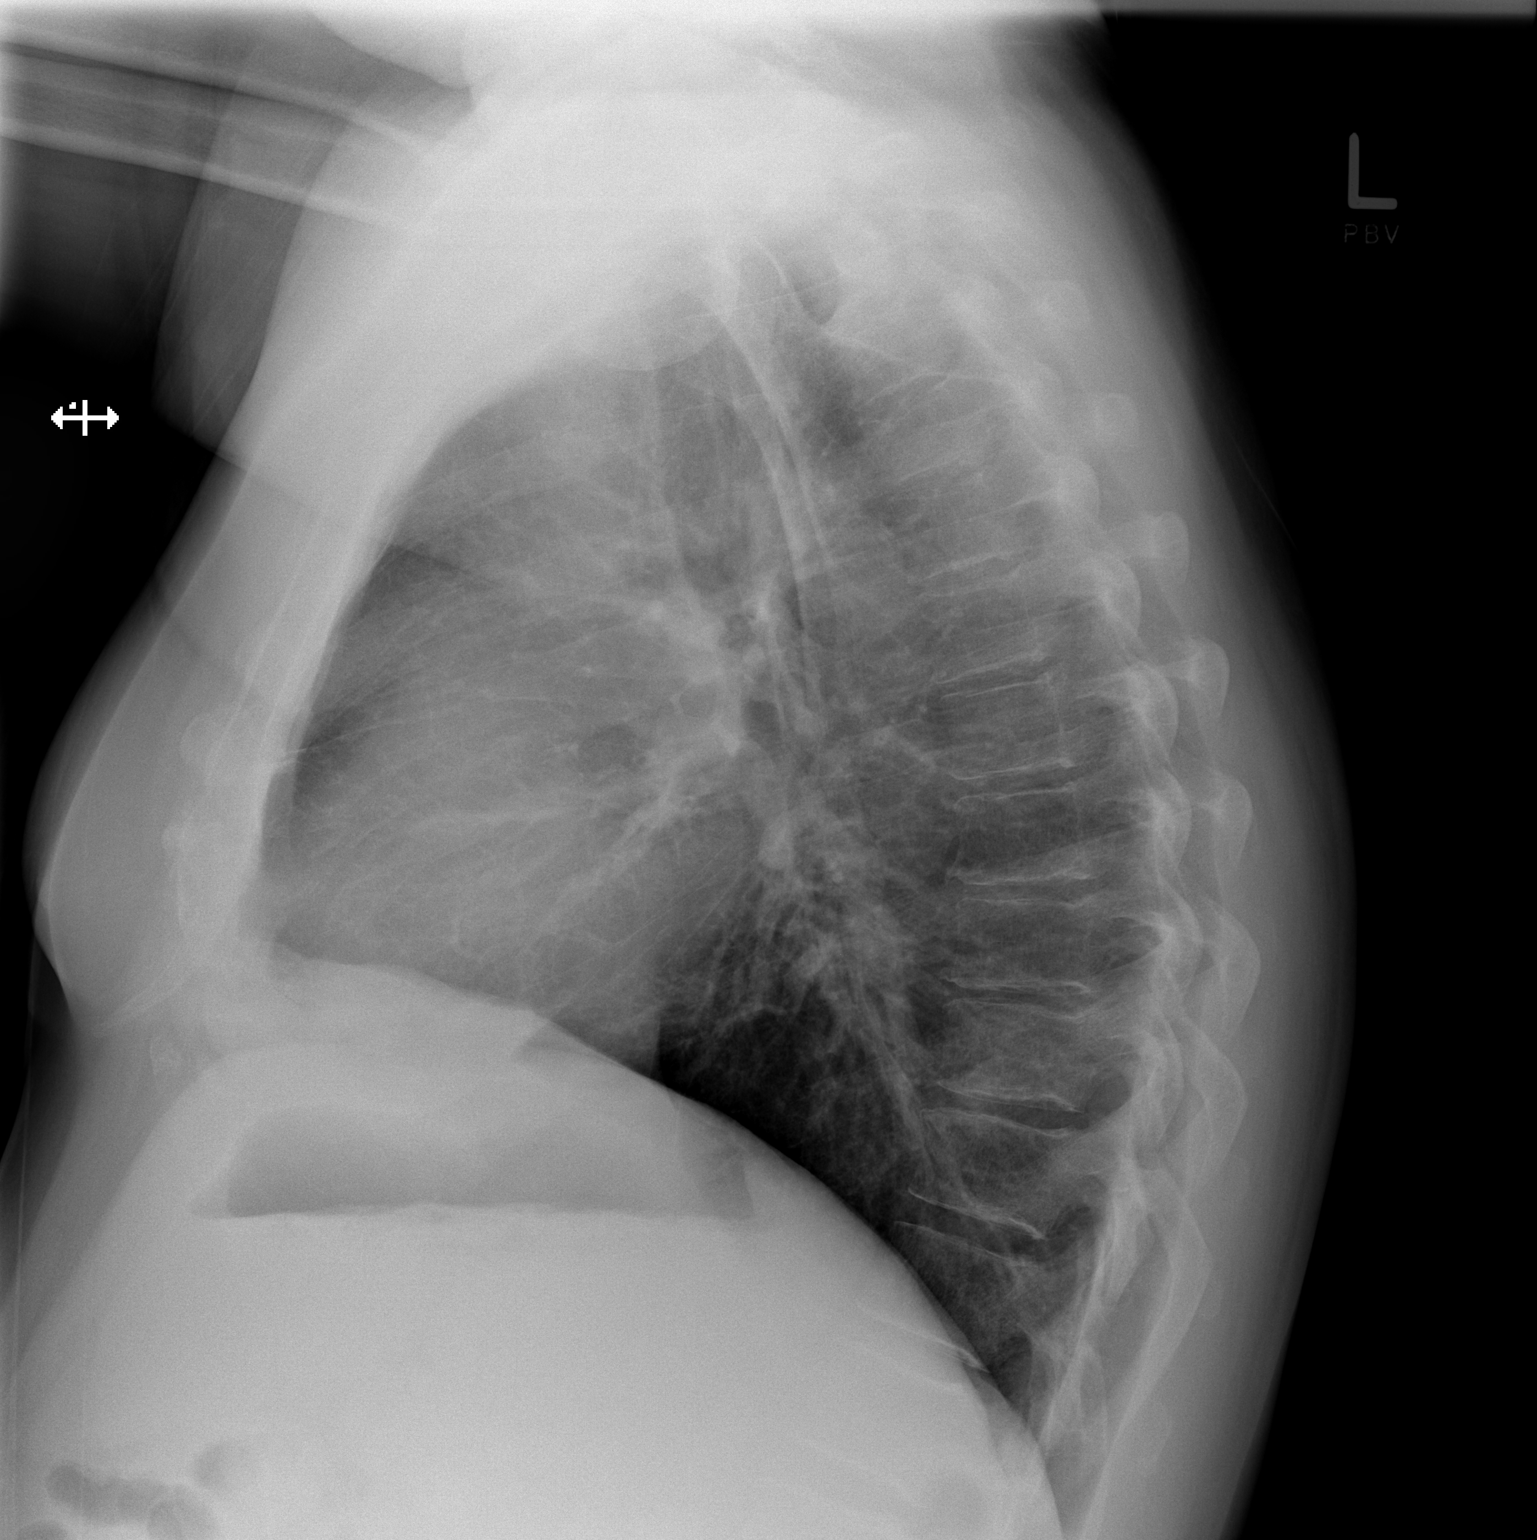

[2 of 2 positions shown; findings below may reference images not displayed]

FINDINGS: Healed left rib fracture. The heart, hila, mediastinum, lungs, and
pleura are otherwise unremarkable.
IMPRESSION: No active cardiopulmonary disease.

## 2019-05-27 MED FILL — MELOXICAM 15 MG TABLET: 15 | 30 days supply | Qty: 30 | Fill #0

## 2019-05-27 MED FILL — metFORMIN HCL 1000 MG TABS: 1000 | 30 days supply | Qty: 60 | Fill #1

## 2019-05-27 MED FILL — TRUE METRIX TEST STRIP: 25 days supply | Qty: 100 | Fill #1

## 2019-05-28 ENCOUNTER — Other Ambulatory Visit: Payer: Self-pay

## 2019-05-28 ENCOUNTER — Ambulatory Visit: Payer: Self-pay | Attending: Internal Medicine | Admitting: Internal Medicine

## 2019-05-28 ENCOUNTER — Encounter: Payer: Self-pay | Admitting: Internal Medicine

## 2019-05-28 DIAGNOSIS — M5412 Radiculopathy, cervical region: Secondary | ICD-10-CM

## 2019-05-28 DIAGNOSIS — E1165 Type 2 diabetes mellitus with hyperglycemia: Secondary | ICD-10-CM

## 2019-05-28 DIAGNOSIS — IMO0002 Reserved for concepts with insufficient information to code with codable children: Secondary | ICD-10-CM

## 2019-05-28 DIAGNOSIS — F419 Anxiety disorder, unspecified: Secondary | ICD-10-CM

## 2019-05-28 DIAGNOSIS — E118 Type 2 diabetes mellitus with unspecified complications: Secondary | ICD-10-CM

## 2019-05-28 DIAGNOSIS — I10 Essential (primary) hypertension: Secondary | ICD-10-CM

## 2019-05-28 MED ORDER — ESCITALOPRAM OXALATE 20 MG PO TABS: 20.0000 mg | ORAL_TABLET | Freq: Every day | ORAL | 3 refills | Status: DC

## 2019-05-28 MED ORDER — METFORMIN HCL 1000 MG PO TABS: 1000.0000 mg | ORAL_TABLET | Freq: Two times a day (BID) | ORAL | 6 refills | Status: DC

## 2019-05-28 MED ORDER — LOSARTAN POTASSIUM 50 MG PO TABS: 50.0000 mg | ORAL_TABLET | Freq: Every day | ORAL | 6 refills | Status: DC

## 2019-05-28 MED ORDER — GLIMEPIRIDE 2 MG PO TABS: 2.0000 mg | ORAL_TABLET | Freq: Every day | ORAL | 3 refills | Status: DC

## 2019-05-28 MED ORDER — TRUE METRIX BLOOD GLUCOSE TEST VI STRP: ORAL_STRIP | 12 refills | Status: DC

## 2019-05-28 MED ORDER — MELOXICAM 15 MG PO TABS: 15.0000 mg | ORAL_TABLET | Freq: Every day | ORAL | 6 refills | Status: DC | PRN

## 2019-05-28 MED FILL — GLIMEPIRIDE 2 MG TABS: 2 | 30 days supply | Qty: 30 | Fill #0

## 2019-05-28 MED FILL — LOSARTAN POTASSIUM 50 MG TA: 50 | 30 days supply | Qty: 30 | Fill #0

## 2019-05-28 MED FILL — ESCITALOPRAM 20 MG TABLET: 20 | 30 days supply | Qty: 30 | Fill #0

## 2019-05-28 NOTE — Progress Notes (Signed)
Virtual Visit via Telephone Note Due to current restrictions/limitations of in-office visits due to the COVID-19 pandemic, this scheduled clinical appointment was converted to a telehealth visit  I connected with Adrian Schultz on 05/28/19 at 3:31 p.m EDT by telephone and verified that I am speaking with the correct person using two identifiers. I am in my office.  The patient is at home.  Only the patient and myself participated in this encounter.  I discussed the limitations, risks, security and privacy concerns of performing an evaluation and management service by telephone and the availability of in person appointments. I also discussed with the patient that there may be a patient responsible charge related to this service. The patient expressed understanding and agreed to proceed.   History of Present Illness: Pt with hx of HTN, DM with neuropathy, ETOH abuse, anxiety, chronic RT shoulder pain.  Patient was last evaluated 03/30/2019  HTN:  Reports compliance with Losartan.  No device to check blood pressure  DM:  Last A1C was 10.7 in 11/2018 Checks BS TID.  Gives range 120-170 in mornings, lower during the day in the 120s; sometimes into 200-300 in the evenings.  Gets anxious when his BS is high Does a lot of walking during the day.  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  Wants Valium. States his grandfather was on it for migraines and did okay.  States he does not like taking pills.  "I need something to help settle my brain."  States anxious and feeling paranoid when he is out walking around that someone may be following him.  Takes Lexapro as needed rather than every day.  He states that it calms him down after about 1 hour Sees a psychotherapist at [facility] refill on meloxicam which he takes for neck pain.   Outpatient Encounter Medications as of 05/28/2019  Medication Sig  .  atorvastatin (LIPITOR) 10 MG tablet Take 1 tablet (10 mg total) by mouth daily.  . Blood Glucose Monitoring Suppl (TRUE METRIX METER) w/Device KIT 1 each by Does not apply route daily.  . busPIRone (BUSPAR) 5 MG tablet Take 1 tablet (5 mg total) by mouth 2 (two) times daily.  Marland Kitchen escitalopram (LEXAPRO) 20 MG tablet Take 1 tablet (20 mg total) by mouth daily.  Marland Kitchen glucose blood (TRUE METRIX BLOOD GLUCOSE TEST) test strip Use as instructed  . glucose blood (TRUE METRIX BLOOD GLUCOSE TEST) test strip Use as instructed  . losartan (COZAAR) 50 MG tablet Take 1 tablet (50 mg total) by mouth daily.  . meloxicam (MOBIC) 15 MG tablet Take 1 tablet (15 mg total) by mouth daily as needed for pain. Take with food.  . metFORMIN (GLUCOPHAGE) 1000 MG tablet Take 1 tablet (1,000 mg total) by mouth 2 (two) times daily with a meal.  . pregabalin (LYRICA) 50 MG capsule Take 1 capsule (50 mg total) by mouth 2 (two) times daily.  . TRUEPLUS LANCETS 28G MISC Use as directed   No facility-administered encounter medications on file as of 05/28/2019.     Observations/Objective: Depression screen [facility] 2/9 05/28/2019 03/30/2019 01/30/2019  Decreased Interest 2 2 1   Down, Depressed, Hopeless 2 3 1   PHQ - 2 Score 4 5 2   Altered sleeping 2 3 2   Tired, decreased energy 2 3 2   Change in appetite 2 3 1   Feeling bad or failure about yourself  0 3  2  Trouble concentrating 0 3 1  Moving slowly or fidgety/restless 0 2 1  Suicidal thoughts 0 0 2  PHQ-9 Score 10 22 13   Some recent data might be hidden   GAD 7 : Generalized Anxiety Score 05/28/2019 03/30/2019 01/30/2019 12/08/2018  Nervous, Anxious, on Edge 1 3 2 1   Control/stop worrying 1 3 2 2   Worry too much - different things 1 3 2 2   Trouble relaxing 1 3 2 2   Restless 1 0 2 1  Easily annoyed or irritable 1 1 2 1   Afraid - awful might happen 1 3 2 3   Total GAD 7 Score 7 16 14 12   Anxiety Difficulty - - - -   Assessment and Plan: 1. Diabetes mellitus type 2, uncontrolled, with  complications ([place]) Encourage healthy eating habits.  However this is sometimes difficult given his unstable housing situation. Commended him on staying active. Continue metformin.  Add low-dose of glimepiride. Continue to monitor blood sugars and bring in readings in 3 weeks to see a clinical pharmacist - metFORMIN (GLUCOPHAGE) 1000 MG tablet; Take 1 tablet (1,000 mg total) by mouth 2 (two) times daily with a meal.  Dispense: 60 tablet; Refill: 6 - glucose blood (TRUE METRIX BLOOD GLUCOSE TEST) test strip; Use as instructed  Dispense: 100 each; Refill: 12 - Hemoglobin A1c; Future - glimepiride (AMARYL) 2 MG tablet; Take 1 tablet (2 mg total) by mouth daily before breakfast.  Dispense: 30 tablet; Refill: 3  2. Essential hypertension - losartan (COZAAR) 50 MG tablet; Take 1 tablet (50 mg total) by mouth daily.  Dispense: 30 tablet; Refill: 6  3. Anxiety Encourage patient to speak with his psychotherapist about the anxiety.  His PHQ 9 and gad 7 scores are improved.  I do not feel Valium would be a good option for him.  I also told him that the Lexapro is meant to be taken every day and not as needed - escitalopram (LEXAPRO) 20 MG tablet; Take 1 tablet (20 mg total) by mouth daily.  Dispense: 30 tablet; Refill: 3  4. Cervical radiculopathy - meloxicam (MOBIC) 15 MG tablet; Take 1 tablet (15 mg total) by mouth daily as needed for pain. Take with food.  Dispense: 30 tablet; Refill: 6   Follow Up Instructions: Follow-up with clinical pharmacist in 3 weeks.  Advised to bring blood sugar readings in with him Follow-up with me in 3 months   I discussed the assessment and treatment plan with the patient. The patient was provided an opportunity to ask questions and all were answered. The patient agreed with the plan and demonstrated an understanding of the instructions.   The patient was advised to call back or seek an in-person evaluation if the symptoms worsen or if the condition fails to improve  as anticipated.  I provided 22 minutes of non-face-to-face time during this encounter.   Karle Plumber, MD

## 2019-05-28 NOTE — Progress Notes (Signed)
Patient verified DOB Patient has a current cough and congestion present. Patient states it is present more in the morning. Patient has taken medication. Patient has eaten today.

## 2019-06-17 MED FILL — TRUE METRIX TEST STRIP: 25 days supply | Qty: 100 | Fill #2

## 2019-06-18 ENCOUNTER — Ambulatory Visit: Payer: Self-pay | Admitting: Pharmacist

## 2019-07-07 MED FILL — NALTREXONE 50 MG TABLET: 50 | 30 days supply | Qty: 30 | Fill #0

## 2019-07-07 MED FILL — FLUoxetine HCL 20 MG CAPS: 20 | 30 days supply | Qty: 30 | Fill #0

## 2019-07-07 MED FILL — hydrOXYzine HCL 10 MG TABS: 10 | 30 days supply | Qty: 90 | Fill #0

## 2019-07-07 MED FILL — PRAZOSIN 1 MG CAPSULE: 1 | 30 days supply | Qty: 30 | Fill #0

## 2019-07-07 MED FILL — ARIPiprazole 10 MG TABS: 10 | 30 days supply | Qty: 30 | Fill #0

## 2019-07-09 ENCOUNTER — Other Ambulatory Visit: Payer: Self-pay

## 2019-07-09 DIAGNOSIS — M5412 Radiculopathy, cervical region: Secondary | ICD-10-CM

## 2019-07-09 MED ORDER — PREGABALIN 50 MG PO CAPS: 50.0000 mg | ORAL_CAPSULE | Freq: Two times a day (BID) | ORAL | 3 refills | Status: DC

## 2019-07-13 ENCOUNTER — Other Ambulatory Visit: Payer: Self-pay | Admitting: Internal Medicine

## 2019-07-13 DIAGNOSIS — IMO0001 Reserved for inherently not codable concepts without codable children: Secondary | ICD-10-CM

## 2019-07-13 MED FILL — TRUE METRIX TEST STRIP: 25 days supply | Qty: 100 | Fill #3

## 2019-07-14 MED FILL — TRUEplus LANCETS 28G MISC: 25 days supply | Qty: 100 | Fill #0

## 2019-07-21 MED FILL — MELOXICAM 15 MG TABLET: 15 | 30 days supply | Qty: 30 | Fill #0

## 2019-07-28 MED FILL — TRUEplus LANCETS 28G MISC: 25 days supply | Qty: 100 | Fill #0

## 2019-08-08 ENCOUNTER — Emergency Department (HOSPITAL_COMMUNITY)
Admission: EM | Admit: 2019-08-08 | Discharge: 2019-08-08 | Discharge status: Against Medical Advice | Payer: Medicaid Other | Attending: Emergency Medicine | Admitting: Emergency Medicine

## 2019-08-08 ENCOUNTER — Other Ambulatory Visit: Payer: Self-pay

## 2019-08-08 ENCOUNTER — Encounter (HOSPITAL_COMMUNITY): Payer: Self-pay | Admitting: Emergency Medicine

## 2019-08-08 DIAGNOSIS — R109 Unspecified abdominal pain: Secondary | ICD-10-CM | POA: Insufficient documentation

## 2019-08-08 DIAGNOSIS — Z5321 Procedure and treatment not carried out due to patient leaving prior to being seen by health care provider: Secondary | ICD-10-CM | POA: Diagnosis not present

## 2019-08-08 DIAGNOSIS — R51 Headache: Secondary | ICD-10-CM | POA: Diagnosis present

## 2019-08-08 NOTE — ED Notes (Signed)
Pt unable to answer questions when brought to fast track, keeps stating he does not feel well. Head and stomach hurt. Delayed in answering questions.
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
## 2019-08-08 NOTE — ED Notes (Signed)
Pt got up and walked out with all his belongings, leaving his labels in the chair

## 2019-08-12 ENCOUNTER — Encounter (HOSPITAL_COMMUNITY): Payer: Self-pay | Admitting: *Deleted

## 2019-08-12 ENCOUNTER — Other Ambulatory Visit: Payer: Self-pay

## 2019-08-12 ENCOUNTER — Observation Stay (HOSPITAL_COMMUNITY)
Admission: AD | Admit: 2019-08-12 | Discharge: 2019-08-13 | Disposition: A | Discharge status: Home / Self Care | Payer: Medicaid Other | Source: Intra-hospital | Attending: Psychiatry | Admitting: Psychiatry

## 2019-08-12 DIAGNOSIS — E119 Type 2 diabetes mellitus without complications: Secondary | ICD-10-CM | POA: Diagnosis not present

## 2019-08-12 DIAGNOSIS — Z888 Allergy status to other drugs, medicaments and biological substances status: Secondary | ICD-10-CM | POA: Insufficient documentation

## 2019-08-12 DIAGNOSIS — I10 Essential (primary) hypertension: Secondary | ICD-10-CM | POA: Diagnosis not present

## 2019-08-12 DIAGNOSIS — Z20828 Contact with and (suspected) exposure to other viral communicable diseases: Secondary | ICD-10-CM | POA: Diagnosis not present

## 2019-08-12 DIAGNOSIS — F17290 Nicotine dependence, other tobacco product, uncomplicated: Secondary | ICD-10-CM | POA: Insufficient documentation

## 2019-08-12 DIAGNOSIS — F1014 Alcohol abuse with alcohol-induced mood disorder: Secondary | ICD-10-CM | POA: Diagnosis not present

## 2019-08-12 DIAGNOSIS — F29 Unspecified psychosis not due to a substance or known physiological condition: Secondary | ICD-10-CM | POA: Insufficient documentation

## 2019-08-12 DIAGNOSIS — E7800 Pure hypercholesterolemia, unspecified: Secondary | ICD-10-CM | POA: Insufficient documentation

## 2019-08-12 LAB — CBC
HCT: 47 % (ref 39.0–52.0)
Hemoglobin: 15.7 g/dL (ref 13.0–17.0)
MCH: 32.4 pg (ref 26.0–34.0)
MCHC: 33.4 g/dL (ref 30.0–36.0)
MCV: 96.9 fL (ref 80.0–100.0)
Platelets: 225 10*3/uL (ref 150–400)
RBC: 4.85 MIL/uL (ref 4.22–5.81)
RDW: 12.1 % (ref 11.5–15.5)
WBC: 5.5 10*3/uL (ref 4.0–10.5)
nRBC: 0 % (ref 0.0–0.2)

## 2019-08-12 LAB — COMPREHENSIVE METABOLIC PANEL
ALT: 68 U/L — ABNORMAL HIGH (ref 0–44)
AST: 57 U/L — ABNORMAL HIGH (ref 15–41)
Albumin: 3.7 g/dL (ref 3.5–5.0)
Alkaline Phosphatase: 116 U/L (ref 38–126)
Anion gap: 13 (ref 5–15)
BUN: 7 mg/dL (ref 6–20)
CO2: 27 mmol/L (ref 22–32)
Calcium: 8.8 mg/dL — ABNORMAL LOW (ref 8.9–10.3)
Chloride: 103 mmol/L (ref 98–111)
Creatinine, Ser: 0.87 mg/dL (ref 0.61–1.24)
GFR calc Af Amer: >60 mL/min (ref >60)
GFR calc non Af Amer: >60 mL/min (ref >60)
Glucose, Bld: 363 mg/dL — ABNORMAL HIGH (ref 70–99)
Potassium: 3.7 mmol/L (ref 3.5–5.1)
Sodium: 143 mmol/L (ref 135–145)
Total Bilirubin: 0.7 mg/dL (ref 0.3–1.2)
Total Protein: 7.5 g/dL (ref 6.5–8.1)

## 2019-08-12 LAB — LIPID PANEL
Cholesterol: 202 mg/dL — ABNORMAL HIGH (ref 0–200)
HDL: 46 mg/dL (ref >40)
LDL Cholesterol: UNDETERMINED mg/dL (ref 0–99)
Total CHOL/HDL Ratio: 4.4 RATIO
Triglycerides: 597 mg/dL — ABNORMAL HIGH (ref <150)
VLDL: UNDETERMINED mg/dL (ref 0–40)

## 2019-08-12 LAB — RAPID URINE DRUG SCREEN, HOSP PERFORMED
Amphetamines: NOT DETECTED
Barbiturates: NOT DETECTED
Benzodiazepines: NOT DETECTED
Cocaine: NOT DETECTED
Opiates: NOT DETECTED
Tetrahydrocannabinol: NOT DETECTED

## 2019-08-12 LAB — TSH: TSH: 1.186 u[IU]/mL (ref 0.350–4.500)

## 2019-08-12 LAB — ETHANOL: Alcohol, Ethyl (B): 280 mg/dL — ABNORMAL HIGH (ref <10)

## 2019-08-12 MED ORDER — LOSARTAN POTASSIUM 50 MG PO TABS
50.0000 mg | ORAL_TABLET | Freq: Every day | ORAL | Status: DC
  Administered 2019-08-12 – 2019-08-13 (×2): 50 mg via ORAL
  Filled 2019-08-12 (×2): qty 1

## 2019-08-12 MED ORDER — ATORVASTATIN CALCIUM 10 MG PO TABS
10.0000 mg | ORAL_TABLET | Freq: Every day | ORAL | Status: DC
  Administered 2019-08-13: 10 mg via ORAL
  Filled 2019-08-12 (×2): qty 1

## 2019-08-12 MED ORDER — PREGABALIN 50 MG PO CAPS
50.0000 mg | ORAL_CAPSULE | Freq: Two times a day (BID) | ORAL | Status: DC
  Administered 2019-08-13: 50 mg via ORAL
  Filled 2019-08-12 (×2): qty 1

## 2019-08-12 MED ORDER — MELOXICAM 15 MG PO TABS
15.0000 mg | ORAL_TABLET | Freq: Every day | ORAL | Status: DC | PRN
  Filled 2019-08-12: qty 1

## 2019-08-12 MED ORDER — ESCITALOPRAM OXALATE 10 MG PO TABS
20.0000 mg | ORAL_TABLET | Freq: Every day | ORAL | Status: DC
  Administered 2019-08-12 – 2019-08-13 (×2): 20 mg via ORAL
  Filled 2019-08-12 (×2): qty 2
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
## 2019-08-12 NOTE — Progress Notes (Signed)
Patient ID: Adrian Schultz, male   DOB: [birth year], 53 y.o.   MRN: [id number] Pt A&O x 4, no distress noted, calm & cooperative at present.  Resting at present.  Ecchymosis noted to Left Orbit and swelling to lower lip.  Monitoring for safety.

## 2019-08-13 DIAGNOSIS — F1014 Alcohol abuse with alcohol-induced mood disorder: Secondary | ICD-10-CM

## 2019-08-13 LAB — LDL CHOLESTEROL, DIRECT: Direct LDL: 75 mg/dL (ref 0–99)

## 2019-08-13 LAB — HEMOGLOBIN A1C
Hgb A1c MFr Bld: 9.2 % — ABNORMAL HIGH (ref 4.8–5.6)
Mean Plasma Glucose: 217 mg/dL

## 2019-08-13 LAB — SARS CORONAVIRUS 2 (TAT 6-24 HRS): SARS Coronavirus 2: NEGATIVE

## 2019-08-13 NOTE — BHH Suicide Risk Assessment (Cosign Needed)
Suicide Risk Assessment  Discharge Assessment   [facility] Discharge Suicide Risk Assessment   Principal Problem: Alcohol abuse with alcohol-induced mood disorder [facility]) Discharge Diagnoses: Principal Problem:   Alcohol abuse with alcohol-induced mood disorder ([place]) Active Problems:   Psychosis ([place])   Total Time spent with patient: 30 minutes  Musculoskeletal: Strength & Muscle Tone: within normal limits Gait & Station: normal Patient leans: N/A  Psychiatric Specialty Exam:   Blood pressure (!) 162/92, pulse 73, temperature 98.3 F (36.8 C), temperature source Oral, resp. rate 18, SpO2 96 %.There is no height or weight on file to calculate BMI.  General Appearance: Casual  Eye Contact::  Good  Speech:  Clear and Coherent409  Volume:  Normal  Mood:  Euthymic  Affect:  Appropriate and Congruent  Thought Process:  Coherent and Descriptions of Associations: Intact  Orientation:  Full (Time, Place, and Person)  Thought Content:  Logical  Suicidal Thoughts:  No  Homicidal Thoughts:  No  Memory:  Immediate;   Good Recent;   Good Remote;   Good  Judgement:  Good  Insight:  Good  Psychomotor Activity:  Normal  Concentration:  Good  Recall:  Good  Fund of Knowledge:Good  Language: Fair  Akathisia:  No  Handed:  Right  AIMS (if indicated):     Assets:  Desire for Improvement Financial Resources/Insurance Housing Physical Health Social Support  Sleep:     Cognition: WNL  ADL's:  Intact   Mental Status Per Nursing Assessment::   On Admission:  NA  Demographic Factors:  Male, Caucasian and Living alone  Loss Factors: Decrease in vocational status  Historical Factors: NA  Risk Reduction Factors:   Responsible for children under 71 years of age, Sense of responsibility to family, Positive social support and Positive coping skills or problem solving skills  Continued Clinical Symptoms:  Alcohol/Substance Abuse/Dependencies  Cognitive Features That Contribute To Risk:   None    Suicide Risk:  Minimal: No identifiable suicidal ideation.  Patients presenting with no risk factors but with morbid ruminations; may be classified as minimal risk based on the severity of the depressive symptoms    Plan Of Care/Follow-up recommendations:  Other:  Follow-up as discussed with [facility] services including therapy, peer specialist and job coach. Follow up with outpatient psychiatry.   Adrian Kluver, FNP 08/13/2019, 12:16 PM

## 2019-08-13 NOTE — Progress Notes (Signed)
Inpatient Diabetes Program Recommendations  AACE/ADA: New Consensus Statement on Inpatient Glycemic Control (2015)  Target Ranges:  Prepandial:   less than 140 mg/dL      Peak postprandial:   less than 180 mg/dL (1-2 hours)      Critically ill patients:  140 - 180 mg/dL   Lab Results  Component Value Date   GLUCAP 197 (H) 09/06/2017   HGBA1C 9.2 (H) 08/12/2019    Review of Glycemic Control  Diabetes history: DM2 Outpatient Diabetes medications: metformin 1000 mg bid, Amaryl 2 mg QAM Current orders for Inpatient glycemic control: None  HgbA1C - 9.2% - indicates poor glycemic control  Inpatient Diabetes Program Recommendations:     Lantus 10 units QD Novolog 0-15 units tidwc and hs May need meal coverage insulin if post-prandials > 180 mg/dL.  Will follow glucose trends.   Thank you. Lorenda Peck, RD, LDN, CDE Inpatient Diabetes Coordinator [phone]

## 2019-08-13 NOTE — Discharge Instructions (Addendum)
For your mental health needs, you are advised to continue treatment with the [place] Team and with [facility]:       [place] [facility]      [facility], [address]      [address]      [place], [place]  [address]      [phone]      Crisis number: [phone]       [facility]      [address]      [place], [place] [address]      [phone]      Crisis number: [phone]

## 2019-08-13 NOTE — Discharge Summary (Addendum)
Physician Discharge Summary Note  Patient:  Adrian Schultz is an 53 y.o., male MRN:  [id number] DOB:  1966/09/16 Patient phone:  [phone] (home)  Patient address:   [place] [address],  Total Time spent with patient: 30 minutes  Date of Admission:  08/12/2019 Date of Discharge: 08/13/2019  Reason for Admission:    Principal Problem: Alcohol abuse with alcohol-induced mood disorder [facility]) Discharge Diagnoses: Principal Problem:   Alcohol abuse with alcohol-induced mood disorder ([place]) Active Problems:   Psychosis ([place])   Past Psychiatric History: Psychosis, alcohol use disorder  Past Medical History:  Past Medical History:  Diagnosis Date  . Diabetes mellitus without complication ([place])   . ETOH abuse   . Hypercholesteremia   . Hypertension   . Suicidal ideation     Past Surgical History:  Procedure Laterality Date  . FOOT SURGERY    . KNEE SURGERY     Family History: History reviewed. No pertinent family history. Family Psychiatric  History: Patient denies Social History:  Social History   Substance and Sexual Activity  Alcohol Use Yes  . Alcohol/week: 0.0 standard drinks   Comment: beer - daily 1/2 a case      Social History   Substance and Sexual Activity  Drug Use No    Social History   Socioeconomic History  . Marital status: Divorced    Spouse name: Not on file  . Number of children: Not on file  . Years of education: Not on file  . Highest education level: Not on file  Occupational History  . Occupation: Unemployeed  Social Needs  . Financial resource strain: Not on file  . Food insecurity    Worry: Not on file    Inability: Not on file  . Transportation needs    Medical: Not on file    Non-medical: Not on file  Tobacco Use  . Smoking status: Never Smoker  . Smokeless tobacco: Current User    Types: Snuff  . Tobacco comment: daily since 53 years old  Substance and Sexual Activity  . Alcohol use: Yes   Alcohol/week: 0.0 standard drinks    Comment: beer - daily 1/2 a case   . Drug use: No  . Sexual activity: Not Currently  Lifestyle  . Physical activity    Days per week: Not on file    Minutes per session: Not on file  . Stress: Not on file  Relationships  . Social Herbalist on phone: Not on file    Gets together: Not on file    Attends religious service: Not on file    Active member of club or organization: Not on file    Attends meetings of clubs or organizations: Not on file    Relationship status: Not on file  Other Topics Concern  . Not on file  Social History Narrative   Live in shelter - [facility] Course:  Patient observed, no symptoms of psychosis, no symptoms of alcohol withdrawal noted.   Physical Findings: AIMS: Facial and Oral Movements Muscles of Facial Expression: None, normal Lips and Perioral Area: None, normal Jaw: None, normal Tongue: None, normal,Extremity Movements Upper (arms, wrists, hands, fingers): Mild(bilateral hand tremors) Lower (legs, knees, ankles, toes): None, normal, Trunk Movements Neck, shoulders, hips: None, normal, Overall Severity Severity of abnormal movements (highest score from questions above): None, normal Incapacitation due to abnormal movements: None, normal Patient's awareness of abnormal movements (rate  only patient's report): No Awareness, Dental Status Current problems with teeth and/or dentures?: Yes Does patient usually wear dentures?: Yes  CIWA:  CIWA-Ar Total: 2 COWS:  COWS Total Score: 2  Musculoskeletal: Strength & Muscle Tone: within normal limits Gait & Station: normal Patient leans: N/A  Psychiatric Specialty Exam: Physical Exam  Nursing note and vitals reviewed. Constitutional: He is oriented to person, place, and time. He appears well-developed.  HENT:  Head: Normocephalic.  Cardiovascular: Normal rate.  Respiratory: Effort normal.  Neurological: He is alert and oriented to  person, place, and time.  Psychiatric: He has a normal mood and affect. His speech is normal and behavior is normal. Judgment and thought content normal. Cognition and memory are normal.    Review of Systems  Constitutional: Negative.   HENT: Negative.   Eyes: Negative.   Respiratory: Negative.   Cardiovascular: Negative.   Gastrointestinal: Negative.   Genitourinary: Negative.   Musculoskeletal: Negative.   Skin: Negative.   Neurological: Negative.   Endo/Heme/Allergies: Negative.   Psychiatric/Behavioral: Negative.     Blood pressure (!) 162/92, pulse 73, temperature 98.3 F (36.8 C), temperature source Oral, resp. rate 18, SpO2 96 %.There is no height or weight on file to calculate BMI.  General Appearance: Casual  Eye Contact:  Good  Speech:  Clear and Coherent  Volume:  Normal  Mood:  Euthymic  Affect:  Appropriate  Thought Process:  Coherent and Descriptions of Associations: Intact  Orientation:  Full (Time, Place, and Person)  Thought Content:  WDL  Suicidal Thoughts:  No  Homicidal Thoughts:  No  Memory:  Immediate;   Good Recent;   Good Remote;   Good  Judgement:  Good  Insight:  Good  Psychomotor Activity:  Normal  Concentration:  Concentration: Good and Attention Span: Good  Recall:  Good  Fund of Knowledge:  Good  Language:  Good  Akathisia:  No  Handed:  Right  AIMS (if indicated):   N/A  Assets:  Communication Skills Housing Physical Health Social Support  ADL's:  Intact  Cognition:  WNL  Sleep:   N/A        Has this patient used any form of tobacco in the last 30 days? (Cigarettes, Smokeless Tobacco, Cigars, and/or Pipes) No  Blood Alcohol level:  Lab Results  Component Value Date   ETH 280 (H) 08/12/2019   ETH 233 (H) 38/88/2800    Metabolic Disorder Labs:  Lab Results  Component Value Date   HGBA1C 9.2 (H) 08/12/2019   MPG 217 08/12/2019   No results found for: PROLACTIN Lab Results  Component Value Date   CHOL 202 (H)  08/12/2019   TRIG 597 (H) 08/12/2019   HDL 46 08/12/2019   CHOLHDL 4.4 08/12/2019   VLDL UNABLE TO CALCULATE IF TRIGLYCERIDE OVER 400 mg/dL 08/12/2019   LDLCALC UNABLE TO CALCULATE IF TRIGLYCERIDE OVER 400 mg/dL 08/12/2019   LDLCALC 128 (H) 12/05/2018    See Psychiatric Specialty Exam and Suicide Risk Assessment completed by Attending Physician prior to discharge.  Discharge destination:  Home  Is patient on multiple antipsychotic therapies at discharge:  No   Has Patient had three or more failed trials of antipsychotic monotherapy by history:  No  Recommended Plan for Multiple Antipsychotic Therapies: NA   Allergies as of 08/13/2019      Reactions   Lisinopril Cough   Tolerates Losartan (home med)   Buspar [buspirone]    Cause nause      Medication List  TAKE these medications     Indication  atorvastatin 10 MG tablet Commonly known as: LIPITOR Take 1 tablet (10 mg total) by mouth daily.  Indication: High Amount of Fats in the Blood   escitalopram 20 MG tablet Commonly known as: LEXAPRO Take 1 tablet (20 mg total) by mouth daily.  Indication: Major Depressive Disorder   glimepiride 2 MG tablet Commonly known as: AMARYL Take 1 tablet (2 mg total) by mouth daily before breakfast.  Indication: Type 2 Diabetes   glucose blood test strip Commonly known as: True Metrix Blood Glucose Test Use as instructed  Indication: Diabetes Mellitus   True Metrix Blood Glucose Test test strip Generic drug: glucose blood Use as instructed  Indication: Diabetes   losartan 50 MG tablet Commonly known as: COZAAR Take 1 tablet (50 mg total) by mouth daily.  Indication: High Blood Pressure Disorder   meloxicam 15 MG tablet Commonly known as: MOBIC Take 1 tablet (15 mg total) by mouth daily as needed for pain. Take with food.  Indication: Joint Damage causing Pain and Loss of Function   metFORMIN 1000 MG tablet Commonly known as: GLUCOPHAGE Take 1 tablet (1,000 mg total)  by mouth 2 (two) times daily with a meal.  Indication: Type 2 Diabetes   pregabalin 50 MG capsule Commonly known as: LYRICA Take 1 capsule (50 mg total) by mouth 2 (two) times daily.  Indication: Diabetes with Nerve Disease   True Metrix Meter w/Device Kit 1 each by Does not apply route daily.  Indication: Diabetes, Diabetes Mellitus   TRUEplus Lancets 28G Misc USE AS DIRECTED  Indication: Diabetes        Follow-up recommendations:  Other:  Follow up with outpatient psychiatry. Follow-up as discussed with [facility] services including therapy, peer specialist and job coach.   Comments: Abstain from alcohol and illegal drugs while taking prescription medications. In the event of worsening symptoms call the crisis hotline, 911 or go to the nearest emergency department for evaluation and treatment.    Signed: Emmaline Kluver, FNP 08/13/2019, 12:33 PM   Patient seen face-to-face for psychiatric evaluation, chart reviewed and case discussed with the physician extender and developed treatment plan. Reviewed the information documented and agree with the treatment plan.  Buford Dresser, DO 08/13/19 6:01 PM

## 2019-08-13 NOTE — Progress Notes (Signed)
D: Pt A & O X 3. Denies SI, HI, AVH and pain at this time. Presents with tremulous, restless and fidgety on interactions. D/C home as ordered. Picked up in lobby by his ACTT team PSI.  A: D/C instructions reviewed with pt including follow up appointment; compliance encouraged. All belongings from assigned locker given to pt at time of departure. Scheduled medications given with verbal education and effects monitored. Safety checks maintained without incident till time of d/c.  R: Pt receptive to care. Compliant with medications when offered. Denies adverse drug reactions when assessed. Verbalized understanding related to d/c instructions. Signed belonging sheet in agreement with items received from locker. Ambulatory with a steady gait. Appears to be in no physical distress at time of departure.

## 2019-08-13 NOTE — BH Assessment (Signed)
[facility] Assessment Progress Note  Per Adrian Dresser, DO, this pt does not require psychiatric hospitalization at this time.  Pt presents under IVC initiated by pt's case worker with the [employer] Team, which Dr Mariea Clonts will be rescinding.  Pt is to be discharged from the [facility] Observ[facility] Unit.  Discharge instructions advise pt to continue treatment with his current providers at [facility] and at [facility].  At 12:02 this Probation officer spoke to pt's PSI case worker, Adrian Schultz, to notify him of pt's disposition.  He offers to pick pt up, and indicates that he will be able to stop by [facility] around 14:30 today.  Pt's nurse, [place], has been notified.  [name], [place] Triage Specialist [phone]

## 2019-09-01 ENCOUNTER — Telehealth: Payer: Self-pay

## 2019-09-01 NOTE — Telephone Encounter (Signed)
Mr. Adrian Schultz called to request a refill on Lyrica.  I told Mr. Adrian Schultz that the Lyrica that was delivered via [employer] on 07/15/2019 at 9:50am, tracking # [id number], should have lasted him until 09/2019.  Mr. Adrian Schultz then stated that he did not receive meds and that he only moved to the shipping address 30 days ago--but, pt received meds at same shipping address on 04/08/2019 and verbally verified at that time that he had..this med is filled via [facility] thru the [employer].  Shipping address was confirmed with pt.    I told pt that I would reach out to Dr. Wynetta Emery and see if it would be appropriate to send in a rx for Gabapentin until his meds can be refilled again in Nov since he states he does not have any Lyrica.

## 2019-09-02 ENCOUNTER — Telehealth: Payer: Self-pay

## 2019-09-02 NOTE — Telephone Encounter (Signed)
Spoke to pt via phone 09/02/19 11:26am.  Pt was pleasant and expressed understanding that the Gabapentin would not be refilled and aware he has to wait until the end of October before his next scheduled shipment of the Lyrica will be filled.

## 2019-09-03 ENCOUNTER — Other Ambulatory Visit: Payer: Self-pay

## 2019-09-03 ENCOUNTER — Ambulatory Visit: Payer: Self-pay | Attending: Internal Medicine | Admitting: Internal Medicine

## 2019-09-03 ENCOUNTER — Encounter: Payer: Self-pay | Admitting: Internal Medicine

## 2019-09-03 VITALS — BP 130/80 | HR 83 | Temp 98.2°F | Resp 18 | Ht 66.0 in | Wt 197.0 lb

## 2019-09-03 DIAGNOSIS — R945 Abnormal results of liver function studies: Secondary | ICD-10-CM

## 2019-09-03 DIAGNOSIS — Z1211 Encounter for screening for malignant neoplasm of colon: Secondary | ICD-10-CM

## 2019-09-03 DIAGNOSIS — F1010 Alcohol abuse, uncomplicated: Secondary | ICD-10-CM

## 2019-09-03 DIAGNOSIS — I10 Essential (primary) hypertension: Secondary | ICD-10-CM

## 2019-09-03 DIAGNOSIS — G8929 Other chronic pain: Secondary | ICD-10-CM

## 2019-09-03 DIAGNOSIS — R7989 Other specified abnormal findings of blood chemistry: Secondary | ICD-10-CM

## 2019-09-03 DIAGNOSIS — E782 Mixed hyperlipidemia: Secondary | ICD-10-CM

## 2019-09-03 DIAGNOSIS — M25562 Pain in left knee: Secondary | ICD-10-CM

## 2019-09-03 DIAGNOSIS — Z2821 Immunization not carried out because of patient refusal: Secondary | ICD-10-CM

## 2019-09-03 DIAGNOSIS — E119 Type 2 diabetes mellitus without complications: Secondary | ICD-10-CM

## 2019-09-03 LAB — POCT URINALYSIS DIP (CLINITEK)
Bilirubin, UA: NEGATIVE
Blood, UA: NEGATIVE
Glucose, UA: 500 mg/dL — AB
Leukocytes, UA: NEGATIVE
Nitrite, UA: NEGATIVE
POC PROTEIN,UA: NEGATIVE
Spec Grav, UA: 1.02 (ref 1.010–1.025)
Urobilinogen, UA: 0.2 E.U./dL
pH, UA: 5.5 (ref 5.0–8.0)

## 2019-09-03 LAB — GLUCOSE, POCT (MANUAL RESULT ENTRY): POC Glucose: 316 mg/dl — AB (ref 70–99)

## 2019-09-03 MED ORDER — GLIMEPIRIDE 2 MG PO TABS: 2.0000 mg | ORAL_TABLET | Freq: Two times a day (BID) | ORAL | 5 refills | Status: DC

## 2019-09-03 MED FILL — GLIMEPIRIDE 2 MG TABS: 2 | 30 days supply | Qty: 30 | Fill #1

## 2019-09-03 MED FILL — TRUE METRIX TEST STRIP: 25 days supply | Qty: 100 | Fill #4

## 2019-09-03 NOTE — Progress Notes (Signed)
Patient ID: Adrian Schultz, male    DOB: 1966/02/29  MRN: [id number]  CC: Diabetes   Subjective: Adrian Schultz is a 53 y.o. male who presents for chronic ds management.  Last eval 05/2019 His concerns today include:  Pt with hx of HTN, HL, DMwith neuropathy, ETOH abuse, anxiety, chronic RT shoulder pain.  Patient was last evaluated 05/2019  Had mental health admission for 1 day last mth  Had relapse on ETOH abuse.  Attributes relapse to personal family issues.  Reports he is doing better but still drinks a 1/5th in a day "when I have money." Still going to [place].  On Naltrexon through [place].  He does not feel it helps to decrease craving. Recently got his own apartment.  It feels strange to him however because he does not know anyone around him.  HYPERTENSION Currently taking: see medication list Med Adherence: _0  Yes but did not take as yet for today    _1  No Medication side effects: _2  Yes    _3  No Adherence with salt restriction: _4  Yes    _5  No Home Monitoring?: _6  Yes    _7  No Monitoring Frequency: _8  Yes    _9  No Home BP results range: _10  Yes    _11  No SOB? _12  Yes    _13  No Chest Pain?: _14  Yes    _15  No Leg swelling?: _16  Yes    _17  No Headaches?: _18  Yes    _19  No Dizziness? _20  Yes    _21  No Comments:   DIABETES TYPE 2 Last A1C:   Results for orders placed or performed in visit on 09/03/19  Glucose (CBG)  Result Value Ref Range   POC Glucose 316 (A) 70 - 99 mg/dl  POCT URINALYSIS DIP (CLINITEK)  Result Value Ref Range   Color, UA yellow yellow   Clarity, UA cloudy (A) clear   Glucose, UA =500 (A) negative mg/dL   Bilirubin, UA negative negative   Ketones, POC UA trace (5) (A) negative mg/dL   Spec Grav, UA 1.020 1.010 - 1.025   Blood, UA negative negative   pH, UA 5.5 5.0 - 8.0   POC PROTEIN,UA negative negative, trace   Urobilinogen, UA 0.2 0.2 or 1.0 E.U./dL   Nitrite, UA Negative Negative   Leukocytes, UA Negative Negative    Lab Results  Component  Value Date   HGBA1C 9.2 (H) 08/12/2019  down from 10.7 Med Adherence:  _22  Yes but has not taken metformin or Amaryl as yet for today   _23  No Medication side effects:  _24  Yes    _25  No Home Monitoring?  _26  Yes 1-3 x a day    _27  No Home glucose results range: 100-200 Diet Adherence: _28  Yes    _29  No - "poor." Tries to stay away from sweets.  He does his own cooking Exercise: _30  Yes - 2 x a day.  30 mins-1 hr.  Pain in LT knee and hips at times.  Feels he needs something stronger than  Than Meloxicam Hypoglycemic episodes?: _31  Yes    _32  No Numbness of the feet? _33  Yes    _34  No.  Some tingling in LT hand and forearm  Retinopathy hx? _35  Yes    _36  No Last eye exam: 2 yrs ago Comments:  HL:  Taking Lipitor.  TG >500 3 wks ago.    Hx of bronchiectasis: Diagnosed on imaging 2 years ago.  Does not smoke.  He denies no shortness of breath  or cough at this time   Patient Active Problem List   Diagnosis Date Noted  . Psychosis ([place]) 08/12/2019  . Microalbuminuria 01/30/2019  . Alcohol use disorder, severe, in early remission ([place]) 12/05/2018  . Chronic pain of left knee 05/08/2018  . Primary localized osteoarthritis of left knee 12/27/2017  . Cervicalgia 12/27/2017  . Anxiety 12/27/2017  . History of panic attacks 12/27/2017  . Chronic right shoulder pain 12/27/2017  . Essential hypertension 10/04/2017  . Alcohol abuse with alcohol-induced mood disorder ([place]) 09/06/2017  . Hypercholesteremia 07/04/2017  . Type 2 diabetes mellitus without complication, without long-term current use of insulin ([place]) 04/05/2017     Current Outpatient Medications on File Prior to Visit  Medication Sig Dispense Refill  . atorvastatin (LIPITOR) 10 MG tablet Take 1 tablet (10 mg total) by mouth daily. 30 tablet 1  . Blood Glucose Monitoring Suppl (TRUE METRIX METER) w/Device KIT 1 each by Does not apply route daily. 1 kit 0  . escitalopram (LEXAPRO) 20 MG tablet Take 1 tablet (20 mg total) by mouth daily.  30 tablet 3  . glucose blood (TRUE METRIX BLOOD GLUCOSE TEST) test strip Use as instructed 100 each 12  . glucose blood (TRUE METRIX BLOOD GLUCOSE TEST) test strip Use as instructed 100 each 12  . losartan (COZAAR) 50 MG tablet Take 1 tablet (50 mg total) by mouth daily. 30 tablet 6  . meloxicam (MOBIC) 15 MG tablet Take 1 tablet (15 mg total) by mouth daily as needed for pain. Take with food. 30 tablet 6  . metFORMIN (GLUCOPHAGE) 1000 MG tablet Take 1 tablet (1,000 mg total) by mouth 2 (two) times daily with a meal. 60 tablet 6  . pregabalin (LYRICA) 50 MG capsule Take 1 capsule (50 mg total) by mouth 2 (two) times daily. 180 capsule 3  . TRUEplus Lancets 28G MISC USE AS DIRECTED 100 each 4   No current facility-administered medications on file prior to visit.     Allergies  Allergen Reactions  . Lisinopril Cough    Tolerates Losartan (home med)  . Buspar [Buspirone]     Cause nause    Social History   Socioeconomic History  . Marital status: Divorced    Spouse name: Not on file  . Number of children: Not on file  . Years of education: Not on file  . Highest education level: Not on file  Occupational History  . Occupation: Unemployeed  Social Needs  . Financial resource strain: Not on file  . Food insecurity    Worry: Not on file    Inability: Not on file  . Transportation needs    Medical: Not on file    Non-medical: Not on file  Tobacco Use  . Smoking status: Never Smoker  . Smokeless tobacco: Current User    Types: Snuff  . Tobacco comment: daily since 53 years old  Substance and Sexual Activity  . Alcohol use: Yes    Alcohol/week: 0.0 standard drinks    Comment: beer - daily 1/2 a case   . Drug use: No  . Sexual activity: Not Currently  Lifestyle  . Physical activity    Days per week: Not on file    Minutes per session: Not on file  . Stress: Not on file  Relationships  . Social Herbalist on phone: Not on file    Gets together: Not on file     Attends religious service: Not on file  Active member of club or organization: Not on file    Attends meetings of clubs or organizations: Not on file    Relationship status: Not on file  . Intimate partner violence    Fear of current or ex partner: Not on file    Emotionally abused: Not on file    Physically abused: Not on file    Forced sexual activity: Not on file  Other Topics Concern  . Not on file  Social History Narrative   Live in shelter - [employer]     History reviewed. No pertinent family history.  Past Surgical History:  Procedure Laterality Date  . FOOT SURGERY    . KNEE SURGERY      ROS: Review of Systems Negative except as stated above  PHYSICAL EXAM: BP 130/80   Pulse 83   Temp 98.2 F (36.8 C) (Oral)   Resp 18   Ht _0  (1.676 m)   Wt 197 lb (89.4 kg)   SpO2 98%   BMI 31.80 kg/m   Wt Readings from Last 3 Encounters:  09/03/19 197 lb (89.4 kg)  01/30/19 210 lb (95.3 kg)  12/05/18 204 lb (92.5 kg)    Physical Exam  General appearance - alert, well appearing, and in no distress Mental status - normal mood, behavior, speech, dress, motor activity, and thought processes Neck - supple, no significant adenopathy Chest - clear to auscultation, no wheezes, rales or rhonchi, symmetric air entry Heart - normal rate, regular rhythm, normal S1, S2, no murmurs, rubs, clicks or gallops Extremities - peripheral pulses normal, no pedal edema, no clubbing or cyanosis MSK: Knees -slowed passive range of motion.  However no point tenderness on palpation of the medial or lateral joint lines.  Hips- mild discomfort with external rotation and flexion Diabetic Foot Exam - Simple   Simple Foot Form Visual Inspection No deformities, no ulcerations, no other skin breakdown bilaterally: Yes Sensation Testing Intact to touch and monofilament testing bilaterally: Yes Pulse Check Posterior Tibialis and Dorsalis pulse intact bilaterally: Yes Comments     CMP  Latest Ref Rng & Units 08/12/2019 01/30/2019 12/05/2018  Glucose 70 - 99 mg/dL 363(H) - 227(H)  BUN 6 - 20 mg/dL 7 - 12  Creatinine 0.61 - 1.24 mg/dL 0.87 - 0.80  Sodium 135 - 145 mmol/L 143 - 141  Potassium 3.5 - 5.1 mmol/L 3.7 - 4.3  Chloride 98 - 111 mmol/L 103 - 99  CO2 22 - 32 mmol/L 27 - 21  Calcium 8.9 - 10.3 mg/dL 8.8(L) - 9.7  Total Protein 6.5 - 8.1 g/dL 7.5 7.6 7.7  Total Bilirubin 0.3 - 1.2 mg/dL 0.7 0.4 0.9  Alkaline Phos 38 - 126 U/L 116 98 79  AST 15 - 41 U/L 57(H) 21 44(H)  ALT 0 - 44 U/L 68(H) 23 47(H)   Lipid Panel     Component Value Date/Time   CHOL 202 (H) 08/12/2019 1845   CHOL 218 (H) 12/05/2018 1228   TRIG 597 (H) 08/12/2019 1845   HDL 46 08/12/2019 1845   HDL 56 12/05/2018 1228   CHOLHDL 4.4 08/12/2019 1845   VLDL UNABLE TO CALCULATE IF TRIGLYCERIDE OVER 400 mg/dL 08/12/2019 1845   LDLCALC UNABLE TO CALCULATE IF TRIGLYCERIDE OVER 400 mg/dL 08/12/2019 1845   LDLCALC 128 (H) 12/05/2018 1228   LDLDIRECT 75.0 08/12/2019 1845    CBC    Component Value Date/Time   WBC 5.5 08/12/2019 1845   RBC 4.85 08/12/2019 1845   HGB  15.7 08/12/2019 1845   HGB 15.4 12/05/2018 1228   HCT 47.0 08/12/2019 1845   HCT 44.4 12/05/2018 1228   PLT 225 08/12/2019 1845   PLT 183 12/05/2018 1228   MCV 96.9 08/12/2019 1845   MCV 92 12/05/2018 1228   MCH 32.4 08/12/2019 1845   MCHC 33.4 08/12/2019 1845   RDW 12.1 08/12/2019 1845   RDW 12.7 12/05/2018 1228   LYMPHSABS 2.8 03/07/2018 0016   MONOABS 0.4 03/07/2018 0016   EOSABS 0.2 03/07/2018 0016   BASOSABS 0.0 03/07/2018 0016    ASSESSMENT AND PLAN: 1. Type 2 diabetes mellitus without complication, without long-term current use of insulin (HCC) Not at goal.  Patient declines starting Lantus insulin.  We will have him increase Amaryl instead.  Healthy eating habits discussed and encouraged.  Continue regular exercise - Glucose (CBG) - glimepiride (AMARYL) 2 MG tablet; Take 1 tablet (2 mg total) by mouth 2 (two) times daily.   Dispense: 60 tablet; Refill: 5 - POCT URINALYSIS DIP (CLINITEK)  2. Essential hypertension At goal.  He will continue current medication and low-salt diet  3. Abnormal LFTs Likely due to EtOH use.  Encouraged him to abstain completely.  Since he does not feel naltrexone helps him, he may want to consider talking with his psychiatrist about Campral.  Also encourage getting into AA meetings  4. Screening for colon cancer - Fecal occult blood, imunochemical([facility])  5. Alcohol abuse See #3 above  6. Influenza vaccination declined   7. Chronic pain of both hips Continue meloxicam.  I am skeptical about using controlled substance in this patient with ongoing alcohol use - DG Hip Unilat W OR W/O Pelvis Min 4 Views Right; Future - DG Hip Unilat W OR W/O Pelvis Min 4 Views Left; Future  8. Chronic pain of left knee - DG Knee Complete 4 Views Left; Future  9. Mixed hyperlipidemia Continue atorvastatin  Patient was given the opportunity to ask questions.  Patient verbalized understanding of the plan and was able to repeat key elements of the plan.   Orders Placed This Encounter  Procedures  . Fecal occult blood, imunochemical([facility])  . DG Knee Complete 4 Views Left  . DG Hip Unilat W OR W/O Pelvis Min 4 Views Right  . DG Hip Unilat W OR W/O Pelvis Min 4 Views Left  . Glucose (CBG)  . POCT URINALYSIS DIP (CLINITEK)     Requested Prescriptions   Signed Prescriptions Disp Refills  . glimepiride (AMARYL) 2 MG tablet 60 tablet 5    Sig: Take 1 tablet (2 mg total) by mouth 2 (two) times daily.    Return in about 2 months (around 11/03/2019).  Karle Plumber, MD, FACP

## 2019-09-03 NOTE — Patient Instructions (Signed)
Increase glimepiride to 2 mg twice a day.  Please use the stool kit and mailing the sample as soon as possible.  Please go over to the radiology department at [facility] to have your x-rays done.

## 2019-09-18 MED FILL — PRAZOSIN 1 MG CAPSULE: 1 | 30 days supply | Qty: 30 | Fill #0

## 2019-09-18 MED FILL — ARIPIPRAZOLE 15 MG TABS: 15 | 30 days supply | Qty: 30 | Fill #0

## 2019-09-18 MED FILL — FLUoxetine HCL 20 MG CAPS: 20 | 30 days supply | Qty: 30 | Fill #0

## 2019-09-18 MED FILL — hydrOXYzine HCL 25 MG TABS: 25 | 30 days supply | Qty: 90 | Fill #0

## 2019-09-18 MED FILL — NALTREXONE 50 MG TABLET: 50 | 30 days supply | Qty: 30 | Fill #0

## 2019-09-22 MED FILL — TRUE METRIX TEST STRIP: 25 days supply | Qty: 100 | Fill #5

## 2019-09-28 LAB — FECAL OCCULT BLOOD, IMMUNOCHEMICAL

## 2019-09-29 ENCOUNTER — Telehealth: Payer: Self-pay

## 2019-09-29 NOTE — Telephone Encounter (Signed)
Contacted pt to make aware that his fit test was not ran because of the age of the specimen and if he wants to repeat test he can come by and pick up another test left a detailed vm and if he has any questions or concerns to give me a call

## 2019-11-05 ENCOUNTER — Ambulatory Visit: Payer: Medicaid Other | Attending: Internal Medicine | Admitting: Internal Medicine

## 2019-11-05 ENCOUNTER — Other Ambulatory Visit: Payer: Self-pay

## 2019-12-04 ENCOUNTER — Other Ambulatory Visit: Payer: Self-pay | Admitting: Internal Medicine

## 2019-12-04 DIAGNOSIS — M5412 Radiculopathy, cervical region: Secondary | ICD-10-CM

## 2019-12-14 MED FILL — TRUE METRIX TEST STRIP: 25 days supply | Qty: 100 | Fill #6

## 2019-12-18 ENCOUNTER — Ambulatory Visit: Payer: Medicaid Other | Admitting: Family

## 2020-01-05 ENCOUNTER — Other Ambulatory Visit: Payer: Self-pay

## 2020-01-05 ENCOUNTER — Ambulatory Visit: Payer: Medicaid Other | Attending: Family | Admitting: Family

## 2020-01-05 ENCOUNTER — Encounter: Payer: Self-pay | Admitting: Family

## 2020-01-05 VITALS — BP 136/88 | HR 95 | Temp 92.3°F | Resp 16 | Wt 205.0 lb

## 2020-01-05 DIAGNOSIS — Z7984 Long term (current) use of oral hypoglycemic drugs: Secondary | ICD-10-CM | POA: Diagnosis not present

## 2020-01-05 DIAGNOSIS — E7800 Pure hypercholesterolemia, unspecified: Secondary | ICD-10-CM | POA: Diagnosis not present

## 2020-01-05 DIAGNOSIS — M25562 Pain in left knee: Secondary | ICD-10-CM

## 2020-01-05 DIAGNOSIS — E119 Type 2 diabetes mellitus without complications: Secondary | ICD-10-CM | POA: Diagnosis not present

## 2020-01-05 DIAGNOSIS — F419 Anxiety disorder, unspecified: Secondary | ICD-10-CM | POA: Diagnosis not present

## 2020-01-05 DIAGNOSIS — R112 Nausea with vomiting, unspecified: Secondary | ICD-10-CM | POA: Diagnosis not present

## 2020-01-05 DIAGNOSIS — Z79899 Other long term (current) drug therapy: Secondary | ICD-10-CM | POA: Diagnosis not present

## 2020-01-05 DIAGNOSIS — G8929 Other chronic pain: Secondary | ICD-10-CM

## 2020-01-05 DIAGNOSIS — Z791 Long term (current) use of non-steroidal anti-inflammatories (NSAID): Secondary | ICD-10-CM | POA: Diagnosis not present

## 2020-01-05 DIAGNOSIS — M25552 Pain in left hip: Secondary | ICD-10-CM | POA: Insufficient documentation

## 2020-01-05 DIAGNOSIS — R0602 Shortness of breath: Secondary | ICD-10-CM | POA: Diagnosis not present

## 2020-01-05 DIAGNOSIS — M25551 Pain in right hip: Secondary | ICD-10-CM | POA: Insufficient documentation

## 2020-01-05 DIAGNOSIS — I10 Essential (primary) hypertension: Secondary | ICD-10-CM | POA: Diagnosis not present

## 2020-01-05 MED ORDER — NAPROXEN 500 MG PO TABS: 500.0000 mg | ORAL_TABLET | Freq: Two times a day (BID) | ORAL | 1 refills | Status: DC

## 2020-01-05 MED FILL — NAPROXEN 500 MG TABLET: 500 | 90 days supply | Qty: 180 | Fill #0

## 2020-01-05 NOTE — Patient Instructions (Signed)
Take Naproxen for left knee pain. Turn in orange card application. Chronic Knee Pain, Adult Knee pain that lasts longer than 3 months is called chronic knee pain. You may have pain in one or both knees. Symptoms of chronic knee pain may also include swelling and stiffness. The most common cause is age-related wear and tear (osteoarthritis) of your knee joint. Many conditions can cause chronic knee pain. Treatment depends on the cause. The main treatments are physical therapy and weight loss. It may also be treated with medicines, injections, a knee sleeve or brace, and by using crutches. Rest, ice, compression (pressure), and elevation (RICE) therapy may also be recommended. Follow these instructions at home: If you have a knee sleeve or brace:   Wear it as told by your doctor. Remove it only as told by your doctor.  Loosen it if your toes: ? Tingle. ? Become numb. ? Turn cold and blue.  Keep it clean.  If the sleeve or brace is not waterproof: ? Do not let it get wet. ? Remove it if told by your doctor, or cover it with a watertight covering when you take a bath or shower. Managing pain, stiffness, and swelling      If told, put heat on your knee. Do this as often as told by your doctor. Use the heat source that your doctor recommends, such as a moist heat pack or a heating pad. ? If you have a removable sleeve or brace, remove it as told by your doctor. ? Place a towel between your skin and the heat source. ? Leave the heat on for 20-30 minutes. ? Remove the heat if your skin turns bright red. This is very important if you are unable to feel pain, heat, or cold. You may have a greater risk of getting burned.  If told, put ice on your knee. ? If you have a removable sleeve or brace, remove it as told by your doctor. ? Put ice in a plastic bag. ? Place a towel between your skin and the bag. ? Leave the ice on for 20 minutes, 2-3 times a day.  Move your toes often.  Raise  (elevate) the injured area above the level of your heart while you are sitting or lying down. Activity  Avoid activities where both feet leave the ground at the same time (high-impact activities). Examples are running, jumping rope, and doing jumping jacks.  Return to your normal activities as told by your doctor. Ask your doctor what activities are safe for you.  Follow the exercise plan that your doctor makes for you. Your doctor may suggest that you: ? Avoid activities that make knee pain worse. You may need to change the exercises that you do, the sports that you participate in, or your job duties. ? Wear shoes with cushioned soles. ? Avoid high-impact activities or sports that require running and sudden changes in direction. ? Do exercises or physical therapy as told by your doctor. Physical therapy is planned to match your needs and abilities. ? Do exercises that increase your balance and strength, such as tai chi and yoga.  Do not use your injured knee to support your body weight until your doctor says that you can. Use crutches, a cane, or a walker, as told by your doctor. General instructions  Take over-the-counter and prescription medicines only as told by your doctor.  If you are overweight, work with your doctor and a food expert (dietitian) to set goals to lose  weight. Being overweight can make your knee hurt more.  Do not use any products that contain nicotine or tobacco, such as cigarettes, e-cigarettes, and chewing tobacco. If you need help quitting, ask your doctor.  Keep all follow-up visits as told by your doctor. This is important. Contact a doctor if:  You have knee pain that is not getting better or gets worse.  You are not able to do your exercises due to knee pain. Get help right away if:  Your knee swells and the swelling becomes worse.  You cannot move your knee.  You have very bad knee pain. Summary  Knee pain that lasts more than 3 months is  considered chronic knee pain.  The main treatments for chronic knee pain are physical therapy and weight loss. You may also need to take medicines, wear a knee sleeve or brace, use crutches, and put ice or heat on your knee.  Lose weight if you are overweight. Work with your doctor and a food expert (dietitian) to help you set goals to lose weight. Being overweight can make your knee hurt more.  Work with a physical therapist to make a safe exercise program, as told by your doctor. This information is not intended to replace advice given to you by your health care provider. Make sure you discuss any questions you have with your health care provider. Document Revised: 01/22/2019 Document Reviewed: 01/22/2019 Elsevier Patient Education  [place].

## 2020-01-05 NOTE — Progress Notes (Signed)
Acute Office Visit  Subjective:    Patient ID: Adrian Schultz, male    DOB: 1966-05-12, 54 y.o.   MRN: [id number]  Chief Complaint  Patient presents with  . Knee Pain    HPI Past history of essential hypertension, diabetes type 2, primary localized osteoarthritis of left knee, hypercholesterolemia, anxiety, chronic right shoulder pain, chronic pain of left knee, and psychosis. Patient is in today for left knee pain follow-up.   1. LEFT KNEE PAIN FOLLOW-UP:   Reports left knee pain since 1987, had surgery on same site in 1987. Pain rating 10/10. States that years ago his previous orthopedic surgeon recommended an additional surgery but he was never able to get the surgery. Left knee pain that radiates to left hip then goes numb. As a result of taking the weight off of left hip and knee causes pain in the right hip. Denies ambulation with any devices.  Reports that he fell about 1-1/2 weeks ago on his bottom. Does not take any medication for pain. Was taking Meloxicam but it doesn't help. Standing and walking makes it worse. Nothing makes it better. Denies left knee swelling, left calf pain, left knee redness, chest pain, fever. Admits nausea and vomiting in the morning after he places tobacco dip in mouth but says that he still has to have it, shortness of breath only when anxious, left leg neuropathy. Reports able to get about 6 hours of sleep at night before the knee pain awakens him. States that he needs to reapply for orange card, reports that he has the application but hasn't had the opportunity to complete it yet.   Past Medical History:  Diagnosis Date  . Diabetes mellitus without complication ([place])   . ETOH abuse   . Hypercholesteremia   . Hypertension   . Suicidal ideation     Past Surgical History:  Procedure Laterality Date  . FOOT SURGERY    . KNEE SURGERY      No family history on file.  Social History   Socioeconomic History  . Marital status: Divorced    Spouse  name: Not on file  . Number of children: Not on file  . Years of education: Not on file  . Highest education level: Not on file  Occupational History  . Occupation: Unemployeed  Tobacco Use  . Smoking status: Never Smoker  . Smokeless tobacco: Current User    Types: Snuff  . Tobacco comment: daily since 54 years old  Substance and Sexual Activity  . Alcohol use: Yes    Alcohol/week: 0.0 standard drinks    Comment: beer - daily 1/2 a case   . Drug use: No  . Sexual activity: Not Currently  Other Topics Concern  . Not on file  Social History Narrative   Live in shelter - [facility] Determinants of Health   Financial Resource Strain:   . Difficulty of Paying Living Expenses: Not on file  Food Insecurity:   . Worried About Charity fundraiser in the Last Year: Not on file  . Ran Out of Food in the Last Year: Not on file  Transportation Needs:   . Lack of Transportation (Medical): Not on file  . Lack of Transportation (Non-Medical): Not on file  Physical Activity:   . Days of Exercise per Week: Not on file  . Minutes of Exercise per Session: Not on file  Stress:   . Feeling of Stress : Not on file  Social  Connections:   . Frequency of Communication with Friends and Family: Not on file  . Frequency of Social Gatherings with Friends and Family: Not on file  . Attends Religious Services: Not on file  . Active Member of Clubs or Organizations: Not on file  . Attends Archivist Meetings: Not on file  . Marital Status: Not on file  Intimate Partner Violence:   . Fear of Current or Ex-Partner: Not on file  . Emotionally Abused: Not on file  . Physically Abused: Not on file  . Sexually Abused: Not on file    Outpatient Medications Prior to Visit  Medication Sig Dispense Refill  . atorvastatin (LIPITOR) 10 MG tablet Take 1 tablet (10 mg total) by mouth daily. 30 tablet 1  . Blood Glucose Monitoring Suppl (TRUE METRIX METER) w/Device KIT 1 each by Does not  apply route daily. 1 kit 0  . escitalopram (LEXAPRO) 20 MG tablet Take 1 tablet (20 mg total) by mouth daily. 30 tablet 3  . glimepiride (AMARYL) 2 MG tablet Take 1 tablet (2 mg total) by mouth 2 (two) times daily. 60 tablet 5  . glucose blood (TRUE METRIX BLOOD GLUCOSE TEST) test strip Use as instructed 100 each 12  . glucose blood (TRUE METRIX BLOOD GLUCOSE TEST) test strip Use as instructed 100 each 12  . losartan (COZAAR) 50 MG tablet Take 1 tablet (50 mg total) by mouth daily. 30 tablet 6  . LYRICA 50 MG capsule Take 1 capsule by mouth twice a day as directed by physician. 180 capsule 0  . meloxicam (MOBIC) 15 MG tablet Take 1 tablet (15 mg total) by mouth daily as needed for pain. Take with food. 30 tablet 6  . metFORMIN (GLUCOPHAGE) 1000 MG tablet Take 1 tablet (1,000 mg total) by mouth 2 (two) times daily with a meal. 60 tablet 6  . TRUEplus Lancets 28G MISC USE AS DIRECTED 100 each 4   No facility-administered medications prior to visit.    Allergies  Allergen Reactions  . Lisinopril Cough    Tolerates Losartan (home med)  . Buspar [Buspirone]     Cause nause    Review of Systems  Constitutional: Negative for fever.  HENT: Negative for congestion and sore throat.   Respiratory: Negative for cough, shortness of breath and wheezing.   Cardiovascular: Negative for chest pain, palpitations and leg swelling.  Gastrointestinal: Positive for nausea and vomiting.       Nausea and vomiting only in the morning after consuming tobacco dip.  Musculoskeletal: Positive for joint pain.       Left knee and hip pain.  Negative except for stated above.    Objective:    Physical Exam  BP 136/88   Pulse 95   Temp (!) 92.3 F (33.5 C)   Resp 16   Wt 205 lb (93 kg)   SpO2 96%   BMI 33.09 kg/m  Wt Readings from Last 3 Encounters:  01/05/20 205 lb (93 kg)  09/03/19 197 lb (89.4 kg)  01/30/19 210 lb (95.3 kg)   General appearance - alert, well appearing, and in no distress and  oriented to person, place, and time Mental status - alert, oriented to person, place, and time, normal mood, behavior, speech, dress, motor activity, and thought processes Chest - clear to auscultation, no wheezes, rales or rhonchi, symmetric air entry, no tachypnea, retractions or cyanosis Heart - normal rate, regular rhythm, normal S1, S2, no murmurs, rubs, clicks or gallops Neurological -  alert, oriented, normal speech, no focal findings or movement disorder noted, strength 4/5 left lower extremity, normal muscle tone, no tremors Musculoskeletal - left lower extremity joint and muscle tenderness, no swelling, limited range of motion  Extremities - peripheral pulses normal, no pedal edema, no clubbing or cyanosis  Health Maintenance Due  Topic Date Due  . OPHTHALMOLOGY EXAM  05/04/2018  . COLONOSCOPY  11/24/2018    There are no preventive care reminders to display for this patient.   Lab Results  Component Value Date   TSH 1.186 08/12/2019   Lab Results  Component Value Date   WBC 5.5 08/12/2019   HGB 15.7 08/12/2019   HCT 47.0 08/12/2019   MCV 96.9 08/12/2019   PLT 225 08/12/2019   Lab Results  Component Value Date   NA 143 08/12/2019   K 3.7 08/12/2019   CO2 27 08/12/2019   GLUCOSE 363 (H) 08/12/2019   BUN 7 08/12/2019   CREATININE 0.87 08/12/2019   BILITOT 0.7 08/12/2019   ALKPHOS 116 08/12/2019   AST 57 (H) 08/12/2019   ALT 68 (H) 08/12/2019   PROT 7.5 08/12/2019   ALBUMIN 3.7 08/12/2019   CALCIUM 8.8 (L) 08/12/2019   ANIONGAP 13 08/12/2019   Lab Results  Component Value Date   CHOL 202 (H) 08/12/2019   Lab Results  Component Value Date   HDL 46 08/12/2019   Lab Results  Component Value Date   LDLCALC UNABLE TO CALCULATE IF TRIGLYCERIDE OVER 400 mg/dL 08/12/2019   Lab Results  Component Value Date   TRIG 597 (H) 08/12/2019   Lab Results  Component Value Date   CHOLHDL 4.4 08/12/2019   Lab Results  Component Value Date   HGBA1C 9.2 (H)  08/12/2019       Assessment & Plan:  1. Chronic pain of left knee: - naproxen (NAPROSYN) 500 MG tablet; Take 1 tablet (500 mg total) by mouth 2 (two) times daily with a meal.  Dispense: 90 tablet; Refill: 1 - Ambulatory referral to Orthopedic Surgery -Schedule appointment appointment with financial counselor to apply for orange card  Problem List Items Addressed This Visit    None      Cathyrn Deas Zachery Dauer, NP

## 2020-01-13 ENCOUNTER — Ambulatory Visit: Payer: Medicaid Other | Admitting: Orthopaedic Surgery

## 2020-01-26 ENCOUNTER — Ambulatory Visit: Payer: Medicaid Other | Admitting: Orthopaedic Surgery

## 2020-02-02 ENCOUNTER — Ambulatory Visit: Payer: Medicaid Other | Admitting: Orthopaedic Surgery

## 2020-02-09 ENCOUNTER — Ambulatory Visit (INDEPENDENT_AMBULATORY_CARE_PROVIDER_SITE_OTHER): Payer: Medicaid Other

## 2020-02-09 ENCOUNTER — Ambulatory Visit (INDEPENDENT_AMBULATORY_CARE_PROVIDER_SITE_OTHER): Payer: Medicaid Other | Admitting: Orthopaedic Surgery

## 2020-02-09 ENCOUNTER — Encounter: Payer: Self-pay | Admitting: Orthopaedic Surgery

## 2020-02-09 ENCOUNTER — Other Ambulatory Visit: Payer: Self-pay

## 2020-02-09 DIAGNOSIS — M545 Low back pain, unspecified: Secondary | ICD-10-CM

## 2020-02-09 DIAGNOSIS — M1712 Unilateral primary osteoarthritis, left knee: Secondary | ICD-10-CM | POA: Diagnosis not present

## 2020-02-09 MED ORDER — LIDOCAINE HCL 1 % IJ SOLN
2.0000 mL | INTRAMUSCULAR | Status: AC | PRN
  Administered 2020-02-09: 14:00:00 2 mL

## 2020-02-09 MED ORDER — BUPIVACAINE HCL 0.25 % IJ SOLN
2.0000 mL | INTRAMUSCULAR | Status: AC | PRN
  Administered 2020-02-09: 2 mL via INTRA_ARTICULAR

## 2020-02-09 MED ORDER — METHYLPREDNISOLONE ACETATE 40 MG/ML IJ SUSP
40.0000 mg | INTRAMUSCULAR | Status: AC | PRN
  Administered 2020-02-09: 14:00:00 40 mg via INTRA_ARTICULAR

## 2020-02-09 NOTE — Progress Notes (Signed)
Office Visit Note   Patient: Adrian Schultz           Date of Birth: 11-03-1966           MRN: [id number] Visit Date: 02/09/2020              Requested by: Marcine Matar, MD [address] [place],  [place] [address] PCP: Marcine Matar, MD   Assessment & Plan: Visit Diagnoses:  1. Primary localized osteoarthritis of left knee   2. Bilateral low back pain without sciatica, unspecified chronicity     Plan: Impression is left knee osteoarthritis, #2 bilateral hip trochanteric bursitis.  The patient would like to proceed with cortisone injection to the left knee today.  In regards to the hips, I provided him with an iliotibial band exercise program.  Should he want to proceed with cortisone injections to the hips, he will need to wait at least 1 to 2 weeks from today's injection as he is diabetic.  Follow-Up Instructions: Return if symptoms worsen or fail to improve.   Orders:  Orders Placed This Encounter  Procedures  . Large Joint Inj: L knee  . XR Lumbar Spine 2-3 Views  . XR Pelvis 1-2 Views  . XR KNEE 3 VIEW LEFT   No orders of the defined types were placed in this encounter.     Procedures: Large Joint Inj: L knee on 02/09/2020 2:18 PM Indications: pain Details: 22 G needle, anterolateral approach Medications: 2 mL lidocaine 1 %; 2 mL bupivacaine 0.25 %; 40 mg methylPREDNISolone acetate 40 MG/ML      Clinical Data: No additional findings.   Subjective: Chief Complaint  Patient presents with  . Lower Back - Pain  . Left Knee - Pain    HPI patient is a 54 year old gentleman who comes in today with left knee and bilateral hip pain.  In regards to his left knee, history of osteoarthritis.  He has had cortisone injections in the past with mild to moderate relief of symptoms.  Pain he has is to the entire knee and is worse with ambulation.  He has been taking meloxicam without significant relief of symptoms.  In regards to his hips, both are equally as bad.   The pain he has is to the lateral aspect.  Worse with ambulation.  He does occasionally note tingling to the left lower extremity.  No previous injections to the hip or back.  Review of Systems as detailed in HPI.  All others reviewed and are negative.   Objective: Vital Signs: There were no vitals taken for this visit.  Physical Exam well-developed well-nourished gentleman in no acute distress.  Alert and oriented x3.  Ortho Exam examination of the left knee shows a trace effusion.  Range of motion 0 to 110 degrees.  Medial and lateral joint line tenderness.  Mild patellofemoral crepitus.  He is neurovascularly intact distally.  Bilateral hip exam is moderate tenderness to the greater trochanters.  No spinous or paraspinous pain.  Negative logroll negative straight leg raise.  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  Specialty Comments:  No specialty comments available.  Imaging: XR KNEE 3 VIEW LEFT  Result Date: 02/09/2020 X-rays demonstrate moderate joint space narrowing medial compartment  XR Lumbar Spine 2-3 Views  Result Date: 02/09/2020 X-rays demonstrate mild spondylosis with anterior spurring  XR Pelvis 1-2 Views  Result Date: 02/09/2020  x-rays show moderate degenerative changes to both hips.    PMFS History:  Patient Active Problem List   Diagnosis Date Noted  . Psychosis ([place]) 08/12/2019  . Microalbuminuria 01/30/2019  . Alcohol use disorder, severe, in early remission ([facility]) 12/05/2018  . Chronic pain of left knee 05/08/2018  . Primary localized osteoarthritis of left knee 12/27/2017  . Cervicalgia 12/27/2017  . Anxiety 12/27/2017  . History of panic attacks 12/27/2017  . Chronic right shoulder pain 12/27/2017  . Essential hypertension 10/04/2017  . Alcohol abuse with alcohol-induced mood disorder ([facility]) 09/06/2017  . Hypercholesteremia 07/04/2017  . Type 2 diabetes mellitus without complication, without long-term current use of insulin ([place]) 04/05/2017   Past  Medical History:  Diagnosis Date  . Diabetes mellitus without complication ([place])   . ETOH abuse   . Hypercholesteremia   . Hypertension   . Suicidal ideation     History reviewed. No pertinent family history.  Past Surgical History:  Procedure Laterality Date  . FOOT SURGERY    . KNEE SURGERY     Social History   Occupational History  . Occupation: Unemployeed  Tobacco Use  . Smoking status: Never Smoker  . Smokeless tobacco: Current User    Types: Snuff  . Tobacco comment: daily since 54 years old  Substance and Sexual Activity  . Alcohol use: Yes    Alcohol/week: 0.0 standard drinks    Comment: beer - daily 1/2 a case   . Drug use: No  . Sexual activity: Not Currently

## 2020-02-11 ENCOUNTER — Telehealth: Payer: Self-pay | Admitting: Internal Medicine

## 2020-02-11 ENCOUNTER — Other Ambulatory Visit: Payer: Self-pay | Admitting: Internal Medicine

## 2020-02-11 DIAGNOSIS — M5412 Radiculopathy, cervical region: Secondary | ICD-10-CM

## 2020-02-11 NOTE — Telephone Encounter (Signed)
Adrian Schultz from [employer] is requesting verbal authorization for  LYRICA 50 MG capsule [[id number]]

## 2020-02-12 MED ORDER — PREGABALIN 50 MG PO CAPS: ORAL_CAPSULE | ORAL | 0 refills | Status: DC

## 2020-02-12 NOTE — Telephone Encounter (Signed)
This is just for a verbal renewal.

## 2020-02-12 NOTE — Telephone Encounter (Signed)
Must come from PCP.  

## 2020-02-12 NOTE — Addendum Note (Signed)
Addended by: Jonah Blue B on: 02/12/2020 04:32 PM   Modules accepted: Orders

## 2020-04-20 NOTE — Telephone Encounter (Signed)
CREATED IN ERROR

## 2020-05-03 ENCOUNTER — Other Ambulatory Visit: Payer: Self-pay | Admitting: Internal Medicine

## 2020-05-03 DIAGNOSIS — M5412 Radiculopathy, cervical region: Secondary | ICD-10-CM

## 2020-05-03 NOTE — Telephone Encounter (Signed)
1) Medication(s) Requested (by name):pregabalin (LYRICA) 50 MG capsule [[id number]]    2) Pharmacy of Choice:[facility] - [place], [place] - [address]   3) Special Requests:   Approved medications will be sent to the pharmacy, we will reach out if there is an issue.  Requests made after 3pm may not be addressed until the following business day!  If a patient is unsure of the name of the medication(s) please note and ask patient to call back when they are able to provide all info, do not send to responsible party until all information is available!

## 2020-05-04 ENCOUNTER — Telehealth: Payer: Self-pay

## 2020-05-04 MED ORDER — PREGABALIN 50 MG PO CAPS: ORAL_CAPSULE | ORAL | 0 refills | Status: DC

## 2020-05-04 NOTE — Telephone Encounter (Signed)
Inhaler is not listed on patient's profile. Will need specific medication.

## 2020-05-04 NOTE — Telephone Encounter (Signed)
[facility] [address] [place] . It is delivered [facility].  [phone] ph# pharmacy.  RX # [id number] Lyrica filled 02/19/20 #180. Also pt request spray inhaler at [facility] pharmacy.

## 2020-05-06 NOTE — Telephone Encounter (Signed)
Can we schedule this patient for an office visit? He needs to be seen to have refills sent.

## 2020-05-06 NOTE — Telephone Encounter (Signed)
Called pt. Pt states he was prescribed the inhaler several years ago. Pt states he has some breathing difficulty around hot grease at work. No other symptoms per pt.  Informed pt he may need to see provider since it has been so long. Informed pt I see no record of inhaler. Pt confirmed home mailing address for Lyrica to be mailed to him.

## 2020-05-10 ENCOUNTER — Ambulatory Visit (INDEPENDENT_AMBULATORY_CARE_PROVIDER_SITE_OTHER): Payer: Medicaid Other | Admitting: Orthopaedic Surgery

## 2020-05-10 ENCOUNTER — Other Ambulatory Visit: Payer: Self-pay | Admitting: Orthopaedic Surgery

## 2020-05-10 ENCOUNTER — Encounter: Payer: Self-pay | Admitting: Orthopaedic Surgery

## 2020-05-10 ENCOUNTER — Other Ambulatory Visit: Payer: Self-pay

## 2020-05-10 VITALS — Ht 67.0 in | Wt 193.0 lb

## 2020-05-10 DIAGNOSIS — M1712 Unilateral primary osteoarthritis, left knee: Secondary | ICD-10-CM

## 2020-05-10 DIAGNOSIS — Z77018 Contact with and (suspected) exposure to other hazardous metals: Secondary | ICD-10-CM

## 2020-05-10 NOTE — Progress Notes (Signed)
   Office Visit Note   Patient: Adrian Schultz           Date of Birth: Sep 16, 1966           MRN: [id number] Visit Date: 05/10/2020              Requested by: Marcine Matar, MD [address] [place],  [place] [address] PCP: Marcine Matar, MD   Assessment & Plan: Visit Diagnoses:  1. Primary localized osteoarthritis of left knee     Plan: Impression is chronic left knee pain without relief from cortisone injection.  I recommend MRI at this point to rule out structural abnormalities.  Follow-up after the MRI.  Follow-Up Instructions: Return if symptoms worsen or fail to improve.   Orders:  Orders Placed This Encounter  Procedures  . MR Knee Left w/o contrast   No orders of the defined types were placed in this encounter.     Procedures: No procedures performed   Clinical Data: No additional findings.   Subjective: Chief Complaint  Patient presents with  . Left Knee - Follow-up    [name] is returning today for chronic left knee pain.  He had a cortisone injection about 3 months ago with chief feels that did not help much.  He is taking meloxicam has pain that is worse at night.   Review of Systems   Objective: Vital Signs: Ht 5\' 7"  (1.702 m)   Wt 193 lb (87.5 kg)   BMI 30.23 kg/m   Physical Exam  Ortho Exam Left knee exam is unchanged. Specialty Comments:  No specialty comments available.  Imaging: No results found.   PMFS History: Patient Active Problem List   Diagnosis Date Noted  . Psychosis ([facility]) 08/12/2019  . Microalbuminuria 01/30/2019  . Alcohol use disorder, severe, in early remission ([facility]) 12/05/2018  . Chronic pain of left knee 05/08/2018  . Primary localized osteoarthritis of left knee 12/27/2017  . Cervicalgia 12/27/2017  . Anxiety 12/27/2017  . History of panic attacks 12/27/2017  . Chronic right shoulder pain 12/27/2017  . Essential hypertension 10/04/2017  . Alcohol abuse with alcohol-induced mood disorder ([facility])  09/06/2017  . Hypercholesteremia 07/04/2017  . Type 2 diabetes mellitus without complication, without long-term current use of insulin ([facility]) 04/05/2017   Past Medical History:  Diagnosis Date  . Diabetes mellitus without complication ([facility])   . ETOH abuse   . Hypercholesteremia   . Hypertension   . Suicidal ideation     History reviewed. No pertinent family history.  Past Surgical History:  Procedure Laterality Date  . FOOT SURGERY    . KNEE SURGERY     Social History   Occupational History  . Occupation: Unemployeed  Tobacco Use  . Smoking status: Never Smoker  . Smokeless tobacco: Current User    Types: Snuff  . Tobacco comment: daily since 54 years old  Substance and Sexual Activity  . Alcohol use: Yes    Alcohol/week: 0.0 standard drinks    Comment: beer - daily 1/2 a case   . Drug use: No  . Sexual activity: Not Currently

## 2020-05-26 ENCOUNTER — Telehealth: Payer: Self-pay

## 2020-05-26 NOTE — Telephone Encounter (Signed)
Spoke to pt via phone today, 4pm.  Patient does not qualify for patient assistance through [employer] with active Medicaid-pt expressed understanding and wanted to know where he could refill his script.  I advised pt that the script was sent to [facility].  [facility]  Advised [facility] to call [facility] to see when the medication would be ready for pick up and to inquire about copay.

## 2020-05-27 ENCOUNTER — Ambulatory Visit: Payer: Medicaid Other | Admitting: Internal Medicine

## 2020-06-01 ENCOUNTER — Ambulatory Visit
Admission: RE | Admit: 2020-06-01 | Discharge: 2020-06-01 | Disposition: A | Discharge status: Home / Self Care | Payer: Medicaid Other | Source: Ambulatory Visit | Attending: Orthopaedic Surgery | Admitting: Orthopaedic Surgery

## 2020-06-01 ENCOUNTER — Other Ambulatory Visit: Payer: Self-pay | Admitting: Internal Medicine

## 2020-06-01 ENCOUNTER — Ambulatory Visit
Admission: RE | Admit: 2020-06-01 | Discharge: 2020-06-01 | Disposition: A | Discharge status: Home / Self Care | Payer: Medicaid Other | Source: Ambulatory Visit | Attending: Internal Medicine | Admitting: Internal Medicine

## 2020-06-01 DIAGNOSIS — G8929 Other chronic pain: Secondary | ICD-10-CM

## 2020-06-01 DIAGNOSIS — Z77018 Contact with and (suspected) exposure to other hazardous metals: Secondary | ICD-10-CM

## 2020-06-01 DIAGNOSIS — M1712 Unilateral primary osteoarthritis, left knee: Secondary | ICD-10-CM

## 2020-06-01 DIAGNOSIS — M25552 Pain in left hip: Secondary | ICD-10-CM

## 2020-06-07 ENCOUNTER — Ambulatory Visit: Payer: Medicaid Other | Admitting: Orthopaedic Surgery

## 2020-06-28 ENCOUNTER — Ambulatory Visit (INDEPENDENT_AMBULATORY_CARE_PROVIDER_SITE_OTHER): Payer: Medicaid Other | Admitting: Orthopaedic Surgery

## 2020-06-28 ENCOUNTER — Ambulatory Visit: Payer: Medicaid Other | Admitting: Orthopaedic Surgery

## 2020-06-28 ENCOUNTER — Encounter: Payer: Self-pay | Admitting: Orthopaedic Surgery

## 2020-06-28 VITALS — Ht 66.5 in | Wt 208.4 lb

## 2020-06-28 DIAGNOSIS — M25562 Pain in left knee: Secondary | ICD-10-CM

## 2020-06-28 DIAGNOSIS — G8929 Other chronic pain: Secondary | ICD-10-CM | POA: Diagnosis not present

## 2020-06-28 NOTE — Progress Notes (Signed)
Office Visit Note   Patient: Adrian Schultz           Date of Birth: 09/08/1966           MRN: [id number] Visit Date: 06/28/2020              Requested by: Marcine Matar, MD [address] [place],  [place] [address] PCP: Marcine Matar, MD   Assessment & Plan: Visit Diagnoses:  1. Chronic pain of left knee     Plan: Impression is symptomatic left knee osteoarthritis.  The patient has failed cortisone injections and we would like to proceed with viscosupplementation injections if possible at this point.  We will fill out the JJ paperwork to try and help with expenses.  He will follow-up with [place] if he has been approved.  This patient is diagnosed with osteoarthritis of the knee(s).    Radiographs show evidence of joint space narrowing, osteophytes, subchondral sclerosis and/or subchondral cysts.  This patient has knee pain which interferes with functional and activities of daily living.    This patient has experienced inadequate response, adverse effects and/or intolerance with conservative treatments such as acetaminophen, NSAIDS, topical creams, physical therapy or regular exercise, knee bracing and/or weight loss.   This patient has experienced inadequate response or has a contraindication to intra articular steroid injections for at least 3 months.   This patient is not scheduled to have a total knee replacement within 6 months of starting treatment with viscosupplementation.   Follow-Up Instructions: Return if symptoms worsen or fail to improve.   Orders:  No orders of the defined types were placed in this encounter.  No orders of the defined types were placed in this encounter.     Procedures: No procedures performed   Clinical Data: No additional findings.   Subjective: Chief Complaint  Patient presents with  . Left Knee - Pain    HPI patient is a 54 year old gentleman who comes in today to discuss MRI results of the left knee.  He has had left knee  pain for quite some time.  The pain he has is deep within the knee.  He has increased pain with walking the distance between 2 telephone poles.  He has been taking meloxicam without relief of symptoms.  He has had previous cortisone injections which do not help longer than 3 days.  He does note a very remote injury when he was 54 years old when a tiller took off part of his calf muscle which was then reattached.  No other injury to his left knee.  Subsequent MRI was ordered which showed slight edema to the posterior calf and soleus as well as partial-thickness cartilage loss to the medial patella facet.  Review of Systems as detailed in HPI.  All others reviewed and are negative.   Objective: Vital Signs: Ht 5' 6.5" (1.689 m)   Wt 208 lb 6.4 oz (94.5 kg)   BMI 33.13 kg/m   Physical Exam well-developed well-nourished gentleman in no acute distress.  Alert and oriented x3.  Ortho Exam examination of the left knee shows mild effusion.  Medial joint line tenderness.  No tenderness the patella tendon.  No calf tenderness.  Specialty Comments:  No specialty comments available.  Imaging: No new imaging   PMFS History: Patient Active Problem List   Diagnosis Date Noted  . Psychosis ([facility]) 08/12/2019  . Microalbuminuria 01/30/2019  . Alcohol use disorder, severe, in early remission ([facility]) 12/05/2018  . Chronic  pain of left knee 05/08/2018  . Primary localized osteoarthritis of left knee 12/27/2017  . Cervicalgia 12/27/2017  . Anxiety 12/27/2017  . History of panic attacks 12/27/2017  . Chronic right shoulder pain 12/27/2017  . Essential hypertension 10/04/2017  . Alcohol abuse with alcohol-induced mood disorder ([facility]) 09/06/2017  . Hypercholesteremia 07/04/2017  . Type 2 diabetes mellitus without complication, without long-term current use of insulin ([facility]) 04/05/2017   Past Medical History:  Diagnosis Date  . Diabetes mellitus without complication ([facility])   . ETOH abuse   .  Hypercholesteremia   . Hypertension   . Suicidal ideation     History reviewed. No pertinent family history.  Past Surgical History:  Procedure Laterality Date  . FOOT SURGERY    . KNEE SURGERY     Social History   Occupational History  . Occupation: Unemployeed  Tobacco Use  . Smoking status: Never Smoker  . Smokeless tobacco: Current User    Types: Snuff  . Tobacco comment: daily since 54 years old  Substance and Sexual Activity  . Alcohol use: Yes    Alcohol/week: 0.0 standard drinks    Comment: beer - daily 1/2 a case   . Drug use: No  . Sexual activity: Not Currently

## 2020-07-12 ENCOUNTER — Telehealth: Payer: Self-pay

## 2020-07-12 ENCOUNTER — Ambulatory Visit: Payer: Medicaid Other | Admitting: Internal Medicine

## 2020-07-12 NOTE — Telephone Encounter (Signed)
Faxed [employer] application to [phone] for Monovisc, left knee.

## 2020-07-18 ENCOUNTER — Telehealth: Payer: Self-pay | Admitting: Orthopaedic Surgery

## 2020-07-18 NOTE — Telephone Encounter (Signed)
Patient called.

## 2020-08-04 ENCOUNTER — Telehealth: Payer: Self-pay | Admitting: Orthopaedic Surgery

## 2020-08-04 NOTE — Telephone Encounter (Signed)
Patient called.   He was calling to check the status of his pending gel injection. I don't see where approval was documented but I told him I would check with his providers.  Call back: [phone]

## 2020-08-08 NOTE — Telephone Encounter (Signed)
Talked with patient and advised him that his application is still processing for gel injection, per Velvet with [employer] patient assistance and the turn around time has changed due to staffing.  Patient voiced that he understands.

## 2020-08-09 ENCOUNTER — Other Ambulatory Visit: Payer: Self-pay | Admitting: Pharmacist

## 2020-08-09 DIAGNOSIS — IMO0002 Reserved for concepts with insufficient information to code with codable children: Secondary | ICD-10-CM

## 2020-08-09 DIAGNOSIS — E119 Type 2 diabetes mellitus without complications: Secondary | ICD-10-CM

## 2020-08-09 MED ORDER — GLIMEPIRIDE 2 MG PO TABS: 2.0000 mg | ORAL_TABLET | Freq: Two times a day (BID) | ORAL | 0 refills | Status: DC

## 2020-08-09 MED ORDER — TRUE METRIX BLOOD GLUCOSE TEST VI STRP: ORAL_STRIP | 0 refills | Status: DC

## 2020-08-09 MED ORDER — METFORMIN HCL 1000 MG PO TABS: 1000.0000 mg | ORAL_TABLET | Freq: Two times a day (BID) | ORAL | 0 refills | Status: DC

## 2020-08-09 MED ORDER — ACCU-CHEK SOFTCLIX LANCETS MISC: 2 refills | Status: DC

## 2020-08-09 MED ORDER — ACCU-CHEK GUIDE VI STRP: ORAL_STRIP | 2 refills | Status: DC

## 2020-08-09 MED ORDER — ACCU-CHEK GUIDE ME W/DEVICE KIT: PACK | 0 refills | Status: DC

## 2020-08-09 MED FILL — ACCU-CHEK GUIDE TEST STRIP: 25 days supply | Qty: 100 | Fill #0

## 2020-08-09 MED FILL — GLIMEPIRIDE 2 MG TABS: 2 | 30 days supply | Qty: 60 | Fill #0

## 2020-08-09 MED FILL — metFORMIN HCL 1000 MG TABS: 1000 | 30 days supply | Qty: 60 | Fill #0

## 2020-08-09 MED FILL — ACCU-CHEK SOFTCLIX LANCETS: 90 days supply | Qty: 100 | Fill #0

## 2020-08-09 MED FILL — ACCU-CHEK GUIDE W/DEVICE KI: W/DEVICE | 1 days supply | Qty: 1 | Fill #0

## 2020-08-10 ENCOUNTER — Ambulatory Visit: Payer: Medicaid Other | Admitting: Physician Assistant

## 2020-08-12 ENCOUNTER — Ambulatory Visit: Payer: Medicaid Other | Admitting: Internal Medicine

## 2020-09-02 ENCOUNTER — Other Ambulatory Visit: Payer: Self-pay | Admitting: Internal Medicine

## 2020-09-02 DIAGNOSIS — M5412 Radiculopathy, cervical region: Secondary | ICD-10-CM

## 2020-09-02 NOTE — Telephone Encounter (Signed)
Pt called in to request a refill for pregabalin (LYRICA) 50 MG capsule    Pharmacy:  [id number] [place],  - [address] Phone:  [phone]  Fax:  [phone]

## 2020-09-02 NOTE — Telephone Encounter (Signed)
Requested medication (s) are due for refill today: yes  Requested medication (s) are on the active medication list: yes  Last refill:  05/14/20  Future visit scheduled: yes  Notes to clinic:  not delegated    Requested Prescriptions  Pending Prescriptions Disp Refills   pregabalin (LYRICA) 50 MG capsule 180 capsule 0    Sig: Take 1 capsule by mouth twice a day as directed by physician.      Not Delegated - Neurology:  Anticonvulsants - Controlled Failed - 09/02/2020 11:39 AM      Failed - This refill cannot be delegated      Passed - Valid encounter within last 12 months    Recent Outpatient Visits           8 months ago Chronic pain of left knee   [facility] [place], [place] J, NP   1 year ago Type 2 diabetes mellitus without complication, without long-term current use of insulin (HCC)   [facility] Marcine Matar, MD   1 year ago Diabetes mellitus type 2, uncontrolled, with complications [facility])   [facility] Marcine Matar, MD   1 year ago Generalized anxiety disorder   [facility] Marcine Matar, MD   1 year ago Diabetes mellitus type 2, uncontrolled, without complications [facility])   [facility] Marcine Matar, MD       Future Appointments             In 1 week Rema Fendt, NP [facility]

## 2020-09-03 MED ORDER — PREGABALIN 50 MG PO CAPS: ORAL_CAPSULE | ORAL | 0 refills | Status: DC

## 2020-09-13 ENCOUNTER — Ambulatory Visit: Payer: Medicaid Other | Admitting: Family

## 2020-09-13 NOTE — Progress Notes (Deleted)
Patient ID: Adrian Schultz, male    DOB: 08/10/1966  MRN: [id number]  CC: Hypertension and Diabetes Follow-Up  Subjective: Adrian Schultz is a 54 y.o. male with history of essential hypertension, type 2 diabetes without complication without long-term current use of insulin, alcohol abuse with alcohol-induced mood disorder, primary localized osteoarthritis of left knee, hypercholesterolemia, cervicalgia, anxiety, history of panic attacks, chronic right shoulder pain, chronic pain of left knee, and psychosis who presents for hypertension and diabetes follow-up.  1. HYPERTENSION FOLLOW-UP: 09/03/2019: Visit with Dr. Wynetta Emery. Blood pressure at goal. Continued on current medications.   09/13/2020: Currently taking: see medication list Have you taken your blood pressure medication today: _0  Yes _1  No  Med Adherence: _2  Yes    _3  No Medication side effects: _4  Yes    _5  No Adherence with salt restriction: _6  Yes    _7  No Exercise: Yes _8  No _9  Home Monitoring?: _10  Yes    _11  No Monitoring Frequency: _12  Yes    _13  No Home BP results range: _14  Yes    _15  No Smoking _16  Yes _17  No SOB? _18  Yes    _19  No Chest Pain?: _20  Yes    _21  No Leg swelling?: _22  Yes    _23  No Headaches?: _24  Yes    _25  No Dizziness? _26  Yes    _27  No Comments:   2. DIABETES TYPE 2 FOLLOW-UP: 09/03/2019: Visit with Dr. Wynetta Emery. Not at goal. Declined starting Lantus insulin. Increased Amaryl.   09/13/2020: Last A1C:    Are you fasting today: _28  Yes _29  No  Have you taken your anti-diabetic medications today: _30  Yes _31  No  Med Adherence:  _32  Yes    _33  No Medication side effects:  _34  Yes    _35  No Home Monitoring?  _36  Yes    _37  No Home glucose results range:*** Diet Adherence: _38  Yes    _39  No Exercise: _40  Yes    _41  No Hypoglycemic episodes?: _42  Yes    _43  No Numbness of the feet? _44  Yes    _45  No Retinopathy hx? _46  Yes    _47  No Last eye exam:  Comments: ***  3. HYPERLIPIDEMIA FOLLOW-UP: 09/03/2019: Visit with Dr. Wynetta Emery.  Continued on Atorvastatin.  09/13/2020: Last Lipid Panel results:  HDL  Date Value Ref Range Status  08/12/2019 46 >40 mg/dL Final  12/05/2018 56 >39 mg/dL Final   Triglycerides  Date Value Ref Range Status  08/12/2019 597 (H) <150 mg/dL Final    Are you fasting today: _48  Yes _49  No Med Adherence: _50  Yes    _51  No Medication side effects: _52  Yes    _53  No Muscle aches:  _54  Yes    _55  No Diet Adherence: _56  Yes    _57  No Comments:  ***  Patient Active Problem List   Diagnosis Date Noted  . Psychosis ([place]) 08/12/2019  . Microalbuminuria 01/30/2019  . Alcohol use disorder, severe, in early remission ([place]) 12/05/2018  . Chronic pain of left knee 05/08/2018  . Primary localized osteoarthritis of left knee 12/27/2017  . Cervicalgia 12/27/2017  . Anxiety 12/27/2017  . History of panic attacks 12/27/2017  . Chronic right shoulder pain 12/27/2017  . Essential hypertension 10/04/2017  . Alcohol abuse with alcohol-induced mood disorder ([place]) 09/06/2017  . Hypercholesteremia 07/04/2017  . Type 2 diabetes mellitus without complication, without long-term current use of insulin ([place]) 04/05/2017     Current Outpatient Medications on File Prior to Visit  Medication Sig Dispense Refill  .  Accu-Chek Softclix Lancets lancets Use as instructed to check blood sugar once daily. 100 each 2  . atorvastatin (LIPITOR) 10 MG tablet Take 1 tablet (10 mg total) by mouth daily. 30 tablet 1  . Blood Glucose Monitoring Suppl (ACCU-CHEK GUIDE ME) w/Device KIT Use as instructed to check blood sugar once daily. 1 kit 0  . escitalopram (LEXAPRO) 20 MG tablet Take 1 tablet (20 mg total) by mouth daily. 30 tablet 3  . glimepiride (AMARYL) 2 MG tablet Take 1 tablet (2 mg total) by mouth 2 (two) times daily. 60 tablet 0  . glucose blood (ACCU-CHEK GUIDE) test strip Use as instructed to check blood sugar once daily. 100 each 2  . losartan (COZAAR) 50 MG tablet Take 1 tablet (50 mg total) by mouth daily. 30 tablet 6  .  metFORMIN (GLUCOPHAGE) 1000 MG tablet Take 1 tablet (1,000 mg total) by mouth 2 (two) times daily with a meal. 60 tablet 0  . naproxen (NAPROSYN) 500 MG tablet Take 1 tablet (500 mg total) by mouth 2 (two) times daily with a meal. 90 tablet 1  . pregabalin (LYRICA) 50 MG capsule Take 1 capsule by mouth twice a day as directed by physician. 180 capsule 0   No current facility-administered medications on file prior to visit.    Allergies  Allergen Reactions  . Lisinopril Cough    Tolerates Losartan (home med)  . Buspar [Buspirone]     Cause nause    Social History   Socioeconomic History  . Marital status: Divorced    Spouse name: Not on file  . Number of children: Not on file  . Years of education: Not on file  . Highest education level: Not on file  Occupational History  . Occupation: Unemployeed  Tobacco Use  . Smoking status: Never Smoker  . Smokeless tobacco: Current User    Types: Snuff  . Tobacco comment: daily since 54 years old  Substance and Sexual Activity  . Alcohol use: Yes    Alcohol/week: 0.0 standard drinks    Comment: beer - daily 1/2 a case   . Drug use: No  . Sexual activity: Not Currently  Other Topics Concern  . Not on file  Social History Narrative   Live in shelter - [place] Determinants of Health   Financial Resource Strain:   . Difficulty of Paying Living Expenses: Not on file  Food Insecurity:   . Worried About Charity fundraiser in the Last Year: Not on file  . Ran Out of Food in the Last Year: Not on file  Transportation Needs:   . Lack of Transportation (Medical): Not on file  . Lack of Transportation (Non-Medical): Not on file  Physical Activity:   . Days of Exercise per Week: Not on file  . Minutes of Exercise per Session: Not on file  Stress:   . Feeling of Stress : Not on file  Social Connections:   . Frequency of Communication with Friends and Family: Not on file  . Frequency of Social Gatherings with Friends  and Family: Not on file  . Attends Religious Services: Not on file  . Active Member of Clubs or Organizations: Not on file  . Attends Archivist Meetings: Not on file  . Marital Status: Not on file  Intimate Partner Violence:   . Fear of Current or Ex-Partner: Not on file  . Emotionally Abused: Not on file  . Physically Abused: Not on file  .  Sexually Abused: Not on file    No family history on file.  Past Surgical History:  Procedure Laterality Date  . FOOT SURGERY    . KNEE SURGERY      ROS: Review of Systems Negative except as stated above  PHYSICAL EXAM: There were no vitals taken for this visit.  Physical Exam  {male adult master:310786} {male adult master:310785}  CMP Latest Ref Rng & Units 08/12/2019 01/30/2019 12/05/2018  Glucose 70 - 99 mg/dL 363(H) - 227(H)  BUN 6 - 20 mg/dL 7 - 12  Creatinine 0.61 - 1.24 mg/dL 0.87 - 0.80  Sodium 135 - 145 mmol/L 143 - 141  Potassium 3.5 - 5.1 mmol/L 3.7 - 4.3  Chloride 98 - 111 mmol/L 103 - 99  CO2 22 - 32 mmol/L 27 - 21  Calcium 8.9 - 10.3 mg/dL 8.8(L) - 9.7  Total Protein 6.5 - 8.1 g/dL 7.5 7.6 7.7  Total Bilirubin 0.3 - 1.2 mg/dL 0.7 0.4 0.9  Alkaline Phos 38 - 126 U/L 116 98 79  AST 15 - 41 U/L 57(H) 21 44(H)  ALT 0 - 44 U/L 68(H) 23 47(H)   Lipid Panel     Component Value Date/Time   CHOL 202 (H) 08/12/2019 1845   CHOL 218 (H) 12/05/2018 1228   TRIG 597 (H) 08/12/2019 1845   HDL 46 08/12/2019 1845   HDL 56 12/05/2018 1228   CHOLHDL 4.4 08/12/2019 1845   VLDL UNABLE TO CALCULATE IF TRIGLYCERIDE OVER 400 mg/dL 08/12/2019 1845   LDLCALC UNABLE TO CALCULATE IF TRIGLYCERIDE OVER 400 mg/dL 08/12/2019 1845   LDLCALC 128 (H) 12/05/2018 1228   LDLDIRECT 75.0 08/12/2019 1845    CBC    Component Value Date/Time   WBC 5.5 08/12/2019 1845   RBC 4.85 08/12/2019 1845   HGB 15.7 08/12/2019 1845   HGB 15.4 12/05/2018 1228   HCT 47.0 08/12/2019 1845   HCT 44.4 12/05/2018 1228   PLT 225 08/12/2019 1845    PLT 183 12/05/2018 1228   MCV 96.9 08/12/2019 1845   MCV 92 12/05/2018 1228   MCH 32.4 08/12/2019 1845   MCHC 33.4 08/12/2019 1845   RDW 12.1 08/12/2019 1845   RDW 12.7 12/05/2018 1228   LYMPHSABS 2.8 03/07/2018 0016   MONOABS 0.4 03/07/2018 0016   EOSABS 0.2 03/07/2018 0016   BASOSABS 0.0 03/07/2018 0016    ASSESSMENT AND PLAN:  There are no diagnoses linked to this encounter.   Patient was given the opportunity to ask questions.  Patient verbalized understanding of the plan and was able to repeat key elements of the plan.   No orders of the defined types were placed in this encounter.    Requested Prescriptions    No prescriptions requested or ordered in this encounter    No follow-ups on file.  Camillia Herter, NP

## 2020-09-29 ENCOUNTER — Other Ambulatory Visit: Payer: Self-pay

## 2020-09-29 ENCOUNTER — Encounter: Payer: Self-pay | Admitting: Family

## 2020-09-29 ENCOUNTER — Other Ambulatory Visit: Payer: Self-pay | Admitting: Family

## 2020-09-29 ENCOUNTER — Ambulatory Visit: Payer: Medicaid Other | Attending: Internal Medicine | Admitting: Family

## 2020-09-29 VITALS — BP 135/88 | HR 88 | Resp 20 | Ht 66.0 in | Wt 206.0 lb

## 2020-09-29 DIAGNOSIS — I10 Essential (primary) hypertension: Secondary | ICD-10-CM | POA: Diagnosis not present

## 2020-09-29 DIAGNOSIS — E782 Mixed hyperlipidemia: Secondary | ICD-10-CM | POA: Diagnosis not present

## 2020-09-29 DIAGNOSIS — E119 Type 2 diabetes mellitus without complications: Secondary | ICD-10-CM | POA: Diagnosis not present

## 2020-09-29 DIAGNOSIS — F419 Anxiety disorder, unspecified: Secondary | ICD-10-CM

## 2020-09-29 LAB — POCT GLYCOSYLATED HEMOGLOBIN (HGB A1C): Hemoglobin A1C: 11.8 % — AB (ref 4.0–5.6)

## 2020-09-29 LAB — POCT CBG (FASTING - GLUCOSE)-MANUAL ENTRY
Glucose Fasting, POC: 388 mg/dL — AB (ref 70–99)
Glucose Fasting, POC: 313 mg/dL — AB (ref 70–99)

## 2020-09-29 MED ORDER — METFORMIN HCL 1000 MG PO TABS: 1000.0000 mg | ORAL_TABLET | Freq: Two times a day (BID) | ORAL | 0 refills | Status: DC

## 2020-09-29 MED ORDER — GLIMEPIRIDE 4 MG PO TABS: 4.0000 mg | ORAL_TABLET | Freq: Two times a day (BID) | ORAL | 2 refills | Status: DC

## 2020-09-29 MED ORDER — ESCITALOPRAM OXALATE 20 MG PO TABS: 20.0000 mg | ORAL_TABLET | Freq: Every day | ORAL | 2 refills | Status: DC

## 2020-09-29 MED ORDER — GLIMEPIRIDE 2 MG PO TABS: 2.0000 mg | ORAL_TABLET | Freq: Two times a day (BID) | ORAL | 0 refills | Status: DC

## 2020-09-29 MED ORDER — LOSARTAN POTASSIUM 50 MG PO TABS: 50.0000 mg | ORAL_TABLET | Freq: Every day | ORAL | 2 refills | Status: DC

## 2020-09-29 MED ORDER — ATORVASTATIN CALCIUM 10 MG PO TABS: 10.0000 mg | ORAL_TABLET | Freq: Every day | ORAL | 2 refills | Status: DC

## 2020-09-29 MED ORDER — SODIUM CHLORIDE 0.9 % IV BOLUS
1000.0000 mL | Freq: Once | INTRAVENOUS | Status: AC
  Administered 2020-09-29: 1000 mL via INTRAVENOUS

## 2020-09-29 MED ORDER — INSULIN ASPART 100 UNIT/ML ~~LOC~~ SOLN
10.0000 [IU] | Freq: Once | SUBCUTANEOUS | Status: AC
  Administered 2020-09-29: 10 [IU] via SUBCUTANEOUS

## 2020-09-29 MED ORDER — LOSARTAN POTASSIUM 50 MG PO TABS: 50.0000 mg | ORAL_TABLET | Freq: Every day | ORAL | 6 refills | Status: DC

## 2020-09-29 MED ORDER — GLIMEPIRIDE 2 MG PO TABS: 2.0000 mg | ORAL_TABLET | Freq: Two times a day (BID) | ORAL | 2 refills | Status: DC

## 2020-09-29 MED ORDER — METFORMIN HCL 1000 MG PO TABS: 1000.0000 mg | ORAL_TABLET | Freq: Two times a day (BID) | ORAL | 2 refills | Status: DC

## 2020-09-29 MED ORDER — ATORVASTATIN CALCIUM 10 MG PO TABS: 10.0000 mg | ORAL_TABLET | Freq: Every day | ORAL | 1 refills | Status: DC

## 2020-09-29 MED FILL — GLIMEPIRIDE 4 MG TABS: 4 | 30 days supply | Qty: 60 | Fill #0

## 2020-09-29 MED FILL — LOSARTAN POTASSIUM 50 MG TA: 50 | 30 days supply | Qty: 30 | Fill #0

## 2020-09-29 MED FILL — METFORMIN HCL 1000 MG TABS: 1000 | 30 days supply | Qty: 60 | Fill #0

## 2020-09-29 MED FILL — ESCITALOPRAM 20 MG TABLET: 20 | 30 days supply | Qty: 30 | Fill #0

## 2020-09-29 MED FILL — ATORVASTATIN 10 MG TABLET: 10 | 30 days supply | Qty: 30 | Fill #0

## 2020-09-29 NOTE — Patient Instructions (Signed)
Begin taking Losartan for high blood pressure.  Begin taking Metformin as for diabetes.  Begin taking Amaryl for diabetes.   Follow-up with clinical pharmacist in 1 month for diabetes check.   Begin taking Atorvastatin for high cholesterol.   Lab today.  Follow-up with primary physician in 3 months or sooner if needed. Diabetes Basics  Diabetes (diabetes mellitus) is a long-term (chronic) disease. It occurs when the body does not properly use sugar (glucose) that is released from food after you eat. Diabetes may be caused by one or both of these problems:  Your pancreas does not make enough of a hormone called insulin.  Your body does not react in a normal way to insulin that it makes. Insulin lets sugars (glucose) go into cells in your body. This gives you energy. If you have diabetes, sugars cannot get into cells. This causes high blood sugar (hyperglycemia). Follow these instructions at home: How is diabetes treated? You may need to take insulin or other diabetes medicines daily to keep your blood sugar in balance. Take your diabetes medicines every day as told by your doctor. List your diabetes medicines here: Diabetes medicines  Name of medicine: ______________________________ ? Amount (dose): _______________ Time (a.m./p.m.): _______________ Notes: ___________________________________  Name of medicine: ______________________________ ? Amount (dose): _______________ Time (a.m./p.m.): _______________ Notes: ___________________________________  Name of medicine: ______________________________ ? Amount (dose): _______________ Time (a.m./p.m.): _______________ Notes: ___________________________________ If you use insulin, you will learn how to give yourself insulin by injection. You may need to adjust the amount based on the food that you eat. List the types of insulin you use here: Insulin  Insulin type: ______________________________ ? Amount (dose): _______________ Time  (a.m./p.m.): _______________ Notes: ___________________________________  Insulin type: ______________________________ ? Amount (dose): _______________ Time (a.m./p.m.): _______________ Notes: ___________________________________  Insulin type: ______________________________ ? Amount (dose): _______________ Time (a.m./p.m.): _______________ Notes: ___________________________________  Insulin type: ______________________________ ? Amount (dose): _______________ Time (a.m./p.m.): _______________ Notes: ___________________________________  Insulin type: ______________________________ ? Amount (dose): _______________ Time (a.m./p.m.): _______________ Notes: ___________________________________ How do I manage my blood sugar?  Check your blood sugar levels using a blood glucose monitor as directed by your doctor. Your doctor will set treatment goals for you. Generally, you should have these blood sugar levels:  Before meals (preprandial): 80-130 mg/dL (0.9-3.2 mmol/L).  After meals (postprandial): below 180 mg/dL (10 mmol/L).  A1c level: less than 7%. Write down the times that you will check your blood sugar levels: Blood sugar checks  Time: _______________ Notes: ___________________________________  Time: _______________ Notes: ___________________________________  Time: _______________ Notes: ___________________________________  Time: _______________ Notes: ___________________________________  Time: _______________ Notes: ___________________________________  Time: _______________ Notes: ___________________________________  What do I need to know about low blood sugar? Low blood sugar is called hypoglycemia. This is when blood sugar is at or below 70 mg/dL (3.9 mmol/L). Symptoms may include:  Feeling: ? Hungry. ? Worried or nervous (anxious). ? Sweaty and clammy. ? Confused. ? Dizzy. ? Sleepy. ? Sick to your stomach (nauseous).  Having: ? A fast heartbeat. ? A  headache. ? A change in your vision. ? Tingling or no feeling (numbness) around the mouth, lips, or tongue. ? Jerky movements that you cannot control (seizure).  Having trouble with: ? Moving (coordination). ? Sleeping. ? Passing out (fainting). ? Getting upset easily (irritability). Treating low blood sugar To treat low blood sugar, eat or drink something sugary right away. If you can think clearly and swallow safely, follow the 15:15 rule:  Take 15 grams of a fast-acting carb (carbohydrate).  Talk with your doctor about how much you should take.  Some fast-acting carbs are: ? Sugar tablets (glucose pills). Take 3-4 glucose pills. ? 6-8 pieces of hard candy. ? 4-6 oz (120-150 mL) of fruit juice. ? 4-6 oz (120-150 mL) of regular (not diet) soda. ? 1 Tbsp (15 mL) honey or sugar.  Check your blood sugar 15 minutes after you take the carb.  If your blood sugar is still at or below 70 mg/dL (3.9 mmol/L), take 15 grams of a carb again.  If your blood sugar does not go above 70 mg/dL (3.9 mmol/L) after 3 tries, get help right away.  After your blood sugar goes back to normal, eat a meal or a snack within 1 hour. Treating very low blood sugar If your blood sugar is at or below 54 mg/dL (3 mmol/L), you have very low blood sugar (severe hypoglycemia). This is an emergency. Do not wait to see if the symptoms will go away. Get medical help right away. Call your local emergency services (911 in the [place] Do not drive yourself to the hospital. Questions to ask your health care provider  Do I need to meet with a diabetes educator?  What equipment will I need to care for myself at home?  What diabetes medicines do I need? When should I take them?  How often do I need to check my blood sugar?  What number can I call if I have questions?  When is my next doctor's visit?  Where can I find a support group for people with diabetes? Where to find more information  American Diabetes  Association: www.diabetes.org  American Association of Diabetes Educators: www.diabeteseducator.org/patient-resources Contact a doctor if:  Your blood sugar is at or above 240 mg/dL (02.5 mmol/L) for 2 days in a row.  You have been sick or have had a fever for 2 days or more, and you are not getting better.  You have any of these problems for more than 6 hours: ? You cannot eat or drink. ? You feel sick to your stomach (nauseous). ? You throw up (vomit). ? You have watery poop (diarrhea). Get help right away if:  Your blood sugar is lower than 54 mg/dL (3 mmol/L).  You get confused.  You have trouble: ? Thinking clearly. ? Breathing. Summary  Diabetes (diabetes mellitus) is a long-term (chronic) disease. It occurs when the body does not properly use sugar (glucose) that is released from food after digestion.  Take insulin and diabetes medicines as told.  Check your blood sugar every day, as often as told.  Keep all follow-up visits as told by your doctor. This is important. This information is not intended to replace advice given to you by your health care provider. Make sure you discuss any questions you have with your health care provider. Document Revised: 08/05/2019 Document Reviewed: 02/14/2018 Elsevier Patient Education  2020 [employer].

## 2020-09-29 NOTE — Progress Notes (Addendum)
Patient ID: Adrian Schultz, male    DOB: October 30, 1966  MRN: [id number]  CC: Hypertension and Diabetes Follow-Up  Subjective: Adrian Schultz is a 54 y.o. male with history of essential hypertension, type 2 diabetes mellitus without complication without long-term current use of insulin, alcohol abuse with alcohol-induced mood disorder, hypercholesterolemia, and psychosis who presents for hypertension and diabetes follow-up.   1. HYPERTENSION FOLLOW-UP: 09/03/2019: Visit with Dr. Wynetta Emery. At goal and continued on Losartan.  09/29/2020:  Currently taking: see medication list Have you taken your blood pressure medication today: []  Yes []  No  Med Adherence: []  Yes    [x]  No, none since January 2021 says that he is forgetful  Medication side effects: Not sure because he says he takes too many pills  Adherence with salt restriction: []  Yes    [x]  No Exercise: Yes [x]  No []    Home Monitoring?: []  Yes    [x]  No Monitoring Frequency: []  Yes    [x]  No Home BP results range: []  Yes    [x]  No Smoking []  Yes [x]  No, does dip  SOB? [x]  Yes, sometimes says related to worrying  Chest Pain?: [x]  Yes, sometimes says related to worrying Leg swelling?: []  Yes    [x]  No Headaches?: []  Yes    [x]  No Dizziness? [x]  Yes, sometimes  Comments:  2. DIABETES TYPE 2 FOLLOW-UP: 09/03/2019: Visit with Dr. Wynetta Emery. Not at goal. Declined Lantus insulin. Increased Amaryl.   09/29/2020: Last A1C: 11.8% on 09/29/2020 Are you fasting today: [x]  Yes []  No  Have you taken your anti-diabetic medications today: []  Yes [x]  No  Med Adherence:  []  Yes    [x]  No, just started back Metformin and Amaryl 2 weeks ago (not taking since January 2021). Medication side effects:  Not sure because he says he takes too many pills Home Monitoring?  [x]  Yes    []  No Home glucose results range 90-135 and sometimes 200 Diet Adherence: []  Yes    [x]  No Exercise: []  Yes    [x]  No Hypoglycemic episodes?: []  Yes    [x]  No Numbness of the feet?  [x]  Yes, sometimes Retinopathy hx? []  Yes    [x]  No Last eye exam: 5 years ago at least, wearing reading glasses    3. HYPERLIPIDEMIA FOLLOW-UP: 09/03/2019: Visit with Dr. Wynetta Emery. Continued on Atorvastatin.  09/29/2020: Last Lipid Panel results:  HDL  Date Value Ref Range Status  08/12/2019 46 >40 mg/dL Final  12/05/2018 56 >39 mg/dL Final   Triglycerides  Date Value Ref Range Status  08/12/2019 597 (H) <150 mg/dL Final    Med Adherence: []  Yes    [x]  No, reports not taking medication since January 2021 Diet Adherence: []  Yes    [x]  No  4. ANXIETY FOLLOW-UP: 05/28/2019: Visit with Dr. Wynetta Emery. Encouraged patient to speak with Psychotherapist about anxiety. Valium not a good option at that time. Lexapro prescribed.  09/29/2020: Plans to see counselor at [facility] in 5 days. Reports all people cause anxiety and makes him irritable. Says politics bother him as well. Denies thoughts of self-harm and suicidal ideation.   Patient Active Problem List   Diagnosis Date Noted  . Psychosis ([place]) 08/12/2019  . Microalbuminuria 01/30/2019  . Alcohol use disorder, severe, in early remission ([place]) 12/05/2018  . Chronic pain of left knee 05/08/2018  . Primary localized osteoarthritis of left knee 12/27/2017  . Cervicalgia 12/27/2017  . Anxiety 12/27/2017  . History of panic attacks 12/27/2017  . Chronic right shoulder  pain 12/27/2017  . Essential hypertension 10/04/2017  . Alcohol abuse with alcohol-induced mood disorder ([place]) 09/06/2017  . Hypercholesteremia 07/04/2017  . Type 2 diabetes mellitus without complication, without long-term current use of insulin ([place]) 04/05/2017     Current Outpatient Medications on File Prior to Visit  Medication Sig Dispense Refill  . meloxicam (MOBIC) 15 MG tablet Take 15 mg by mouth daily.    . pregabalin (LYRICA) 50 MG capsule Take 1 capsule by mouth twice a day as directed by physician. 180 capsule 0  . Accu-Chek Softclix Lancets lancets Use as  instructed to check blood sugar once daily. 100 each 2  . Blood Glucose Monitoring Suppl (ACCU-CHEK GUIDE ME) w/Device KIT Use as instructed to check blood sugar once daily. 1 kit 0  . glucose blood (ACCU-CHEK GUIDE) test strip Use as instructed to check blood sugar once daily. 100 each 2  . naproxen (NAPROSYN) 500 MG tablet Take 1 tablet (500 mg total) by mouth 2 (two) times daily with a meal. 90 tablet 1   No current facility-administered medications on file prior to visit.    Allergies  Allergen Reactions  . Lisinopril Cough    Tolerates Losartan (home med)  . Buspar [Buspirone]     Cause nause    Social History   Socioeconomic History  . Marital status: Divorced    Spouse name: Not on file  . Number of children: Not on file  . Years of education: Not on file  . Highest education level: Not on file  Occupational History  . Occupation: Unemployeed  Tobacco Use  . Smoking status: Never Smoker  . Smokeless tobacco: Current User    Types: Snuff  . Tobacco comment: daily since 54 years old  Substance and Sexual Activity  . Alcohol use: Yes    Alcohol/week: 0.0 standard drinks    Comment: beer - daily 1/2 a case   . Drug use: No  . Sexual activity: Not Currently  Other Topics Concern  . Not on file  Social History Narrative   Live in shelter - [place] Determinants of Health   Financial Resource Strain:   . Difficulty of Paying Living Expenses: Not on file  Food Insecurity:   . Worried About Charity fundraiser in the Last Year: Not on file  . Ran Out of Food in the Last Year: Not on file  Transportation Needs:   . Lack of Transportation (Medical): Not on file  . Lack of Transportation (Non-Medical): Not on file  Physical Activity:   . Days of Exercise per Week: Not on file  . Minutes of Exercise per Session: Not on file  Stress:   . Feeling of Stress : Not on file  Social Connections:   . Frequency of Communication with Friends and Family: Not on  file  . Frequency of Social Gatherings with Friends and Family: Not on file  . Attends Religious Services: Not on file  . Active Member of Clubs or Organizations: Not on file  . Attends Archivist Meetings: Not on file  . Marital Status: Not on file  Intimate Partner Violence:   . Fear of Current or Ex-Partner: Not on file  . Emotionally Abused: Not on file  . Physically Abused: Not on file  . Sexually Abused: Not on file    No family history on file.  Past Surgical History:  Procedure Laterality Date  . FOOT SURGERY    . KNEE SURGERY  ROS: Review of Systems Negative except as stated above  PHYSICAL EXAM: BP 135/88   Pulse 88   Resp 20   Ht 5' 6"  (1.676 m)   Wt 206 lb (93.4 kg)   SpO2 96%   BMI 33.25 kg/m   Physical Exam General appearance - alert, well appearing, and in no distress, oriented to person, place, and time and overweight Mental status - alert, oriented to person, place, and time, anxious Neck - supple, no significant adenopathy Lymphatics - no palpable lymphadenopathy, no hepatosplenomegaly Chest - clear to auscultation, no wheezes, rales or rhonchi, symmetric air entry, no tachypnea, retractions or cyanosis Heart - normal rate, regular rhythm, normal S1, S2, no murmurs, rubs, clicks or gallops Neurological - alert, oriented, normal speech, no focal findings or movement disorder noted Skin - normal coloration and turgor, no rashes, no suspicious skin lesions noted Diabetic foot exam was performed with the following findings:   No deformities, ulcerations, or other skin breakdown Normal sensation of 10g monofilament Intact posterior tibialis and dorsalis pedis pulses Left foot with decreased sensation with monofilament testing    Results for orders placed or performed in visit on 09/29/20  Glucose (CBG), Fasting  Result Value Ref Range   Glucose Fasting, POC 388 (A) 70 - 99 mg/dL  HgB A1c  Result Value Ref Range   Hemoglobin A1C 11.8  (A) 4.0 - 5.6 %   HbA1c POC (<> result, manual entry)     HbA1c, POC (prediabetic range)     HbA1c, POC (controlled diabetic range)    Glucose (CBG), Fasting  Result Value Ref Range   Glucose Fasting, POC 313 (A) 70 - 99 mg/dL    ASSESSMENT AND PLAN: 1. Essential hypertension: - Blood pressure close to goal.  - Restart taking Losartan as prescribed.  - Counseled on blood pressure goal of less than 130/80, low-sodium, DASH diet, medication compliance, 150 minutes of moderate intensity exercise per week as tolerated. Discussed medication compliance, adverse effects. - CMP to check kidney function, liver function, and electrolyte balance.  - CBC to check blood count for anemia. - Follow-up with primary physician in 3 months or sooner if needed. - Comprehensive metabolic panel - CBC - losartan (COZAAR) 50 MG tablet; Take 1 tablet (50 mg total) by mouth daily.  Dispense: 30 tablet; Refill: 6  2. Type 2 diabetes mellitus without complication, without long-term current use of insulin ([place]): - Uncontrolled.  - Patient reported he has not taken medications since January 2021 because he is forgetful.  - Hemoglobin A1C not at goal today at 11.8%, goal < 7%. This is increased from previous hemoglobin A1C of 9.2% on 08/12/2019. Reports has not taken medications since January 2021. - Urinalysis positive for glucose and ketones. - Fasting CBG in clinic 388. Administered 10 units of Novolog insulin and 1 liter sodium chloride bolus. CBG rechecked and resulted 313. - Continue Metformin as prescribed.  - Declines use of daily insulin to assist with diabetes management.  - Increase Glimepiride from 2 mg two times daily to 4 mg two times daily.  - Follow-up with clinical pharmacist in 4 weeks for diabetes checkup. Write your home blood sugar results down each day and bring those results to your appointment along with your home glucose monitor. Medications may be adjusted at that time if needed. - CMP to  check kidney function, liver function, and electrolyte balance.  - CBC to check for anemia.  - Referral to Ophthalmology for diabetic eye examination.  -  Follow-up with primary physician in 3 months or sooner if needed. - HgB A1c - Glucose (CBG), Fasting - Glucose (CBG), Fasting - Comprehensive metabolic panel - CBC - sodium chloride 0.9 % bolus 1,000 mL - insulin aspart (novoLOG) injection 10 Units - metFORMIN (GLUCOPHAGE) 1000 MG tablet; Take 1 tablet (1,000 mg total) by mouth 2 (two) times daily with a meal.  Dispense: 60 tablet; Refill: 2 - glimepiride (AMARYL) 4 MG tablet; Take 1 tablet (4 mg total) by mouth 2 (two) times daily.  Dispense: 60 tablet; Refill: 2 - Ambulatory referral to Ophthalmology   3. Mixed hyperlipidemia: - Practice low-fat heart healthy diet and at least 150 minutes of moderate intensity exercise weekly as tolerated.  - Continue Atorvastatin as prescribed.  - Lipid panel to check cholesterol level. Patient is fasting. - Follow-up with primary physician in 3 months or sooner if needed. - Lipid panel - atorvastatin (LIPITOR) 10 MG tablet; Take 1 tablet (10 mg total) by mouth daily.  Dispense: 30 tablet; Refill: 2  4. Anxiety: - Denies thoughts of self-harm and suicidal ideation.  - Continue Escitalopram as prescribed. - Keep all scheduled appointments with [facility].  - Follow-up with primary physician in 3 months or sooner if needed. - escitalopram (LEXAPRO) 20 MG tablet; Take 1 tablet (20 mg total) by mouth daily.  Dispense: 30 tablet; Refill: 2  Patient was given the opportunity to ask questions.  Patient verbalized understanding of the plan and was able to repeat key elements of the plan. Patient was given clear instructions to go to Emergency Department or return to medical center if symptoms don't improve, worsen, or new problems develop.The patient verbalized understanding.   Orders Placed This Encounter  Procedures  . Comprehensive metabolic panel  .  CBC  . Lipid panel  . Ambulatory referral to Ophthalmology  . Glucose (CBG), Fasting  . HgB A1c  . Glucose (CBG), Fasting    Requested Prescriptions   Signed Prescriptions Disp Refills  . metFORMIN (GLUCOPHAGE) 1000 MG tablet 60 tablet 2    Sig: Take 1 tablet (1,000 mg total) by mouth 2 (two) times daily with a meal.  . losartan (COZAAR) 50 MG tablet 30 tablet 2    Sig: Take 1 tablet (50 mg total) by mouth daily.  Marland Kitchen atorvastatin (LIPITOR) 10 MG tablet 30 tablet 2    Sig: Take 1 tablet (10 mg total) by mouth daily.  Marland Kitchen glimepiride (AMARYL) 4 MG tablet 60 tablet 2    Sig: Take 1 tablet (4 mg total) by mouth 2 (two) times daily.  Marland Kitchen escitalopram (LEXAPRO) 20 MG tablet 30 tablet 2    Sig: Take 1 tablet (20 mg total) by mouth daily.    Return in about 3 months (around 12/30/2020) for Dr. Wynetta Emery and 1 month with Lurena Joiner.  Camillia Herter, NP

## 2020-09-30 LAB — COMPREHENSIVE METABOLIC PANEL
ALT: 22 IU/L (ref 0–44)
AST: 21 IU/L (ref 0–40)
Albumin/Globulin Ratio: 1.4 (ref 1.2–2.2)
Albumin: 4.2 g/dL (ref 3.8–4.9)
Alkaline Phosphatase: 88 IU/L (ref 44–121)
BUN/Creatinine Ratio: 7 — ABNORMAL LOW (ref 9–20)
BUN: 5 mg/dL — ABNORMAL LOW (ref 6–24)
Bilirubin Total: 0.6 mg/dL (ref 0.0–1.2)
CO2: 24 mmol/L (ref 20–29)
Calcium: 9 mg/dL (ref 8.7–10.2)
Chloride: 99 mmol/L (ref 96–106)
Creatinine, Ser: 0.67 mg/dL — ABNORMAL LOW (ref 0.76–1.27)
GFR calc Af Amer: 129 mL/min/{1.73_m2} (ref >59)
GFR calc non Af Amer: 111 mL/min/{1.73_m2} (ref >59)
Globulin, Total: 3 g/dL (ref 1.5–4.5)
Glucose: 294 mg/dL — ABNORMAL HIGH (ref 65–99)
Potassium: 4.5 mmol/L (ref 3.5–5.2)
Sodium: 138 mmol/L (ref 134–144)
Total Protein: 7.2 g/dL (ref 6.0–8.5)

## 2020-09-30 LAB — CBC
Hematocrit: 40 % (ref 37.5–51.0)
Hemoglobin: 13.9 g/dL (ref 13.0–17.7)
MCH: 32.9 pg (ref 26.6–33.0)
MCHC: 34.8 g/dL (ref 31.5–35.7)
MCV: 95 fL (ref 79–97)
Platelets: 202 10*3/uL (ref 150–450)
RBC: 4.22 x10E6/uL (ref 4.14–5.80)
RDW: 12.3 % (ref 11.6–15.4)
WBC: 6.4 10*3/uL (ref 3.4–10.8)

## 2020-09-30 LAB — LIPID PANEL
Chol/HDL Ratio: 5.2 ratio — ABNORMAL HIGH (ref 0.0–5.0)
Cholesterol, Total: 224 mg/dL — ABNORMAL HIGH (ref 100–199)
HDL: 43 mg/dL (ref >39)
LDL Chol Calc (NIH): 144 mg/dL — ABNORMAL HIGH (ref 0–99)
Triglycerides: 206 mg/dL — ABNORMAL HIGH (ref 0–149)
VLDL Cholesterol Cal: 37 mg/dL (ref 5–40)

## 2020-09-30 NOTE — Progress Notes (Signed)
Please call patient with update.   Kidney function normal.   Liver function normal.   No anemia.   Cholesterol higher than expected. High cholesterol may increase risk of heart attack and/or stroke. Consider eating more fruits, vegetables, and lean baked meats such as chicken or fish. Moderate intensity exercise at least 150 minutes as tolerated per week may help as well.  Resume taking Atorvastatin for cholesterol as prescribed. Follow-up with primary physician at next appointment.   Diabetes discussed in clinic.

## 2020-10-12 ENCOUNTER — Telehealth: Payer: Self-pay

## 2020-10-12 NOTE — Telephone Encounter (Signed)
Received approval letter from [employer] for Monovisc, left knee. Faxed completed PRF to [employer] at [phone].

## 2020-10-18 ENCOUNTER — Ambulatory Visit: Payer: Medicaid Other | Admitting: Orthopaedic Surgery

## 2020-10-26 ENCOUNTER — Ambulatory Visit (INDEPENDENT_AMBULATORY_CARE_PROVIDER_SITE_OTHER): Payer: Medicaid Other | Admitting: Orthopaedic Surgery

## 2020-10-26 ENCOUNTER — Encounter: Payer: Self-pay | Admitting: Orthopaedic Surgery

## 2020-10-26 ENCOUNTER — Other Ambulatory Visit: Payer: Self-pay

## 2020-10-26 DIAGNOSIS — M1712 Unilateral primary osteoarthritis, left knee: Secondary | ICD-10-CM | POA: Diagnosis not present

## 2020-10-26 MED ORDER — BUPIVACAINE HCL 0.25 % IJ SOLN
2.0000 mL | INTRAMUSCULAR | Status: AC | PRN
  Administered 2020-10-26: 2 mL via INTRA_ARTICULAR

## 2020-10-26 MED ORDER — HYALURONAN 88 MG/4ML IX SOSY
88.0000 mg | PREFILLED_SYRINGE | INTRA_ARTICULAR | Status: AC | PRN
  Administered 2020-10-26: 88 mg via INTRA_ARTICULAR

## 2020-10-26 MED ORDER — LIDOCAINE HCL 1 % IJ SOLN
2.0000 mL | INTRAMUSCULAR | Status: AC | PRN
  Administered 2020-10-26: 2 mL

## 2020-10-26 NOTE — Progress Notes (Signed)
   Procedure Note  Patient: Adrian Schultz             Date of Birth: [birth year]           MRN: [id number]             Visit Date: 10/26/2020  Procedures: Visit Diagnoses:  1. Unilateral primary osteoarthritis, left knee     Large Joint Inj: L knee on 10/26/2020 10:04 AM Indications: pain Details: 22 G needle, anterolateral approach Medications: 2 mL lidocaine 1 %; 2 mL bupivacaine 0.25 %; 88 mg Hyaluronan 88 MG/4ML

## 2020-11-01 ENCOUNTER — Other Ambulatory Visit: Payer: Self-pay | Admitting: Internal Medicine

## 2020-11-01 ENCOUNTER — Encounter: Payer: Self-pay | Admitting: Pharmacist

## 2020-11-01 ENCOUNTER — Ambulatory Visit: Payer: Medicaid Other | Attending: Internal Medicine | Admitting: Pharmacist

## 2020-11-01 ENCOUNTER — Other Ambulatory Visit: Payer: Self-pay

## 2020-11-01 DIAGNOSIS — Z23 Encounter for immunization: Secondary | ICD-10-CM

## 2020-11-01 DIAGNOSIS — E1165 Type 2 diabetes mellitus with hyperglycemia: Secondary | ICD-10-CM

## 2020-11-01 DIAGNOSIS — E118 Type 2 diabetes mellitus with unspecified complications: Secondary | ICD-10-CM | POA: Diagnosis not present

## 2020-11-01 DIAGNOSIS — IMO0002 Reserved for concepts with insufficient information to code with codable children: Secondary | ICD-10-CM

## 2020-11-01 MED ORDER — TRUEPLUS PEN NEEDLES 32G X 4 MM MISC: 2 refills | Status: DC

## 2020-11-01 MED ORDER — LANTUS SOLOSTAR 100 UNIT/ML ~~LOC~~ SOPN: 10.0000 [IU] | PEN_INJECTOR | Freq: Every day | SUBCUTANEOUS | 0 refills | Status: DC

## 2020-11-01 MED FILL — TRUEplus 5-BEVEL PEN NEEDLE: 32G X 4 MM | 90 days supply | Qty: 100 | Fill #0

## 2020-11-01 MED FILL — LANTUS SOLOSTAR 100 UNITS/M: 100 | 90 days supply | Qty: 9 | Fill #0

## 2020-11-01 NOTE — Patient Instructions (Signed)
Atorvastatin 10 mg tablet   Take once a day.   For cholesterol, heart health        Escitalopram 20 mg tablet   Take once a day.   For mood, antidepression        Lantus insulin  Take 10 units once a day.  For diabetes   Metformin 1000 mg tablet   Take 1 tablet in the morning and 1 tablet in the evening.   For diabetes         Losartan 50 mg tablet   Take once a day.  For high blood pressure        Pregabalin 50 mg capsule  Take 1 capsule in the morning and 1 capsule in the evening.   For nerve pain

## 2020-11-01 NOTE — Progress Notes (Signed)
    S:     No chief complaint on file.  Patient arrives well and in good spirits.  Presents for diabetes evaluation, education, and management. Patient was referred and last seen by Primary Care Provider, Ricky Stabs, on 09/29/20. At that visit, patient reported he had not been taking medications since January 2021. Patient was started back on metformin and glimepiride.   Today, patient reports appropriate adherence to his metformin. Denies any side effects. Patient has not started glimepiride. He reports difficulty remembering to take his medications, as well as difficulty remembering what his medications are for. He requests a print out of his medications in patient friendly terms.   Insurance coverage/medication affordability: Medicaid   Medication adherence denied.  Current diabetes medications include: metformin 1g BID, glimepiride 4 mg (not taking)  Patient denies hypoglycemic events.  Patient reported dietary habits: Eats 2 meals/day Breakfast: grits, eggs, biscuits, sausage Lunch and dinner: corn, green beans, chicken, pork chops  Patient-reported exercise habits: none    Patient reports nocturia (nighttime urination).  Patient reports neuropathy (nerve pain). Patient reports visual changes. Patient denies self foot exams.     O:  Lab Results  Component Value Date   HGBA1C 11.8 (A) 09/29/2020   There were no vitals filed for this visit.  Lipid Panel     Component Value Date/Time   CHOL 224 (H) 09/29/2020 1340   TRIG 206 (H) 09/29/2020 1340   HDL 43 09/29/2020 1340   CHOLHDL 5.2 (H) 09/29/2020 1340   CHOLHDL 4.4 08/12/2019 1845   VLDL UNABLE TO CALCULATE IF TRIGLYCERIDE OVER 400 mg/dL 54/27/0623 7628   LDLCALC 144 (H) 09/29/2020 1340   LDLDIRECT 75.0 08/12/2019 1845      Clinical Atherosclerotic Cardiovascular Disease (ASCVD): No  The 10-year ASCVD risk score Denman George DC Jr., et al., 2013) is: 12.5%   Values used to calculate the score:     Age: 54 years      Sex: Male     Is Non-Hispanic African American: No     Diabetic: Yes     Tobacco smoker: No     Systolic Blood Pressure: 135 mmHg     Is BP treated: Yes     HDL Cholesterol: 43 mg/dL     Total Cholesterol: 224 mg/dL    A/P: Diabetes longstanding currently uncontrolled. Patient is able to verbalize appropriate hypoglycemia management plan. Medication adherence has historically been an issue. Control is suboptimal due to dietary indiscretion, physical inactivity, and medication noncompliance. Patient has previously been very resistant to injectables. Discussed the need for insulin today. Pt is amenable to starting this.  -Continue metformin 1g BID -Started Lantus insulin 10 units daily. Discussed appropriate injection technique. Patient practiced with demo pens, as well as injected himself with a insulin needle. -Stopped glimepiride  -Extensively discussed pathophysiology of diabetes, recommended lifestyle interventions, dietary effects on blood sugar control -Counseled on s/sx of and management of hypoglycemia -Next A1C anticipated February 2022.   Written patient instructions provided.  Total time in face to face counseling 25 minutes.   Follow up Pharmacist Clinic Visit in 2 weeks.    Theodis Sato, PharmD PGY-1 [facility] Pharmacy Resident   11/01/2020 12:14 PM

## 2020-11-02 MED FILL — ACCU-CHEK SOFTCLIX LANCETS: 90 days supply | Qty: 100 | Fill #1

## 2020-11-02 MED FILL — ACCU-CHEK GUIDE TEST STRIP: 90 days supply | Qty: 100 | Fill #0

## 2020-11-15 ENCOUNTER — Ambulatory Visit: Payer: Medicaid Other | Admitting: Pharmacist

## 2020-11-23 ENCOUNTER — Telehealth: Payer: Self-pay

## 2020-11-23 NOTE — Telephone Encounter (Signed)
Patient was approved through [employer] patient assistance to receive Monovisc injection for left knee. Patient received Monovisc on 10/26/2020

## 2020-12-07 ENCOUNTER — Ambulatory Visit (INDEPENDENT_AMBULATORY_CARE_PROVIDER_SITE_OTHER): Payer: Medicaid Other | Admitting: Orthopaedic Surgery

## 2020-12-07 ENCOUNTER — Encounter: Payer: Self-pay | Admitting: Orthopaedic Surgery

## 2020-12-07 DIAGNOSIS — M1712 Unilateral primary osteoarthritis, left knee: Secondary | ICD-10-CM | POA: Diagnosis not present

## 2020-12-07 MED ORDER — METHYLPREDNISOLONE ACETATE 40 MG/ML IJ SUSP
40.0000 mg | INTRAMUSCULAR | Status: AC | PRN
  Administered 2020-12-07: 40 mg via INTRA_ARTICULAR

## 2020-12-07 MED ORDER — BUPIVACAINE HCL 0.25 % IJ SOLN
2.0000 mL | INTRAMUSCULAR | Status: AC | PRN
  Administered 2020-12-07: 2 mL via INTRA_ARTICULAR

## 2020-12-07 MED ORDER — LIDOCAINE HCL 1 % IJ SOLN
2.0000 mL | INTRAMUSCULAR | Status: AC | PRN
  Administered 2020-12-07: 2 mL

## 2020-12-07 NOTE — Progress Notes (Signed)
Office Visit Note   Patient: Adrian Schultz           Date of Birth: [birth year]           MRN: [id number] Visit Date: 12/07/2020              Requested by: Marcine Matar, MD [address] [place],  [place] [address] PCP: Marcine Matar, MD   Assessment & Plan: Visit Diagnoses:  1. Unilateral primary osteoarthritis, left knee     Plan: Impression is left knee arthritis flareup.  We will inject the left knee with cortisone today in hopes of providing more relief.  He will follow-up with [place] as needed.  Follow-Up Instructions: Return if symptoms worsen or fail to improve.   Orders:  Orders Placed This Encounter  Procedures  . Large Joint Inj: L knee   No orders of the defined types were placed in this encounter.     Procedures: Large Joint Inj: L knee on 12/07/2020 11:03 AM Indications: pain Details: 22 G needle, anterolateral approach Medications: 2 mL lidocaine 1 %; 2 mL bupivacaine 0.25 %; 40 mg methylPREDNISolone acetate 40 MG/ML      Clinical Data: No additional findings.   Subjective: Chief Complaint  Patient presents with  . Left Knee - Pain    HPI patient is a pleasant 55 year old gentleman who comes in today with pain to the left knee.  We have been seeing him for this where he has been injected with cortisone and viscosupplementation injection.  The last injection about 6 weeks ago was with viscosupplementation and significantly helped.  He still notes he has slight pain with ambulation.  He is requesting repeat cortisone injection today  Review of Systems as detailed in HPI.  All others reviewed and are negative.   Objective: Vital Signs: There were no vitals taken for this visit.  Physical Exam well-developed well-nourished gentleman in no acute distress.  Alert and oriented x3.  Ortho Exam left knee exam shows no effusion.  Range of motion 0 to 125 degrees.  Mild medial joint line tenderness.  Ligaments are stable.  He is neurovascular  intact distally.  Specialty Comments:  No specialty comments available.  Imaging: No new imaging   PMFS History: Patient Active Problem List   Diagnosis Date Noted  . Psychosis ([facility]) 08/12/2019  . Microalbuminuria 01/30/2019  . Alcohol use disorder, severe, in early remission ([facility]) 12/05/2018  . Chronic pain of left knee 05/08/2018  . Primary localized osteoarthritis of left knee 12/27/2017  . Cervicalgia 12/27/2017  . Anxiety 12/27/2017  . History of panic attacks 12/27/2017  . Chronic right shoulder pain 12/27/2017  . Essential hypertension 10/04/2017  . Alcohol abuse with alcohol-induced mood disorder ([facility]) 09/06/2017  . Hypercholesteremia 07/04/2017  . Type 2 diabetes mellitus without complication, without long-term current use of insulin ([facility]) 04/05/2017   Past Medical History:  Diagnosis Date  . Diabetes mellitus without complication ([facility])   . ETOH abuse   . Hypercholesteremia   . Hypertension   . Suicidal ideation     History reviewed. No pertinent family history.  Past Surgical History:  Procedure Laterality Date  . FOOT SURGERY    . KNEE SURGERY     Social History   Occupational History  . Occupation: Unemployeed  Tobacco Use  . Smoking status: Never Smoker  . Smokeless tobacco: Current User    Types: Snuff  . Tobacco comment: daily since 55 years old  Substance and Sexual  Activity  . Alcohol use: Yes    Alcohol/week: 0.0 standard drinks    Comment: beer - daily 1/2 a case   . Drug use: No  . Sexual activity: Not Currently

## 2020-12-19 ENCOUNTER — Other Ambulatory Visit: Payer: Self-pay | Admitting: Pharmacist

## 2020-12-19 DIAGNOSIS — E119 Type 2 diabetes mellitus without complications: Secondary | ICD-10-CM

## 2020-12-19 MED ORDER — ACCU-CHEK GUIDE ME W/DEVICE KIT: PACK | 0 refills | Status: DC

## 2021-01-03 ENCOUNTER — Ambulatory Visit: Payer: Medicaid Other | Admitting: Internal Medicine

## 2021-01-24 ENCOUNTER — Ambulatory Visit: Payer: Medicaid Other | Admitting: Orthopaedic Surgery

## 2021-02-02 ENCOUNTER — Telehealth: Payer: Self-pay

## 2021-02-02 DIAGNOSIS — M5412 Radiculopathy, cervical region: Secondary | ICD-10-CM

## 2021-02-02 MED ORDER — PREGABALIN 50 MG PO CAPS
ORAL_CAPSULE | ORAL | 0 refills | Status: DC
Start: 2021-02-02 — End: 2021-05-10

## 2021-02-02 NOTE — Addendum Note (Signed)
Addended by: Jonah Blue B on: 02/02/2021 10:40 PM   Modules accepted: Orders

## 2021-02-02 NOTE — Telephone Encounter (Signed)
Patient came in to clinic requesting a refill for pregabalin (LYRICA) 50 MG capsule   Patient uses [id number], [place] - [address]  [address], [place] [address]

## 2021-02-02 NOTE — Telephone Encounter (Signed)
Will forward to provider  

## 2021-02-03 ENCOUNTER — Ambulatory Visit (INDEPENDENT_AMBULATORY_CARE_PROVIDER_SITE_OTHER): Payer: Medicaid Other | Admitting: Orthopaedic Surgery

## 2021-02-03 ENCOUNTER — Encounter: Payer: Self-pay | Admitting: Orthopaedic Surgery

## 2021-02-03 VITALS — Ht 66.0 in | Wt 206.0 lb

## 2021-02-03 DIAGNOSIS — M1712 Unilateral primary osteoarthritis, left knee: Secondary | ICD-10-CM

## 2021-02-03 NOTE — Telephone Encounter (Signed)
Contacted pt and made aware that rx was sent to preferred pharmacy

## 2021-02-03 NOTE — Progress Notes (Signed)
Office Visit Note   Patient: Adrian Schultz           Date of Birth: 1966/03/12           MRN: [id number] Visit Date: 02/03/2021              Requested by: Marcine Matar, MD [address] [place],  [place] [address] PCP: Marcine Matar, MD   Assessment & Plan: Visit Diagnoses:  1. Primary osteoarthritis of left knee     Plan: Impression is left knee patellofemoral arthritis.  He has had temporary relief from cortisone injections.  Too soon to repeat today.  Based on findings I think the most important things that he should work on her weight loss and strengthening.  Prescription for physical therapy was made.  Follow-up as needed.  Follow-Up Instructions: Return if symptoms worsen or fail to improve.   Orders:  Orders Placed This Encounter  Procedures  . Ambulatory referral to Physical Therapy   No orders of the defined types were placed in this encounter.     Procedures: No procedures performed   Clinical Data: No additional findings.   Subjective: Chief Complaint  Patient presents with  . Left Knee - Follow-up    [name] returns today for follow-up of left knee pain.  He had a cortisone injection about 8 weeks ago which helped temporarily but stopped recently.  He is taking meloxicam as well.  Denies any changes to his knee otherwise.   Review of Systems  Constitutional: Negative.   All other systems reviewed and are negative.    Objective: Vital Signs: Ht 5\' 6"  (1.676 m)   Wt 206 lb (93.4 kg)   BMI 33.25 kg/m   Physical Exam Vitals and nursing note reviewed.  Constitutional:      Appearance: He is well-developed.  Pulmonary:     Effort: Pulmonary effort is normal.  Abdominal:     Palpations: Abdomen is soft.  Skin:    General: Skin is warm.  Neurological:     Mental Status: He is alert and oriented to person, place, and time.  Psychiatric:        Behavior: Behavior normal.        Thought Content: Thought content normal.         Judgment: Judgment normal.     Ortho Exam Left knee exam is unchanged. Specialty Comments:  No specialty comments available.  Imaging: No results found.   PMFS History: Patient Active Problem List   Diagnosis Date Noted  . Psychosis (HCC) 08/12/2019  . Microalbuminuria 01/30/2019  . Alcohol use disorder, severe, in early remission (HCC) 12/05/2018  . Chronic pain of left knee 05/08/2018  . Primary localized osteoarthritis of left knee 12/27/2017  . Cervicalgia 12/27/2017  . Anxiety 12/27/2017  . History of panic attacks 12/27/2017  . Chronic right shoulder pain 12/27/2017  . Essential hypertension 10/04/2017  . Alcohol abuse with alcohol-induced mood disorder (HCC) 09/06/2017  . Hypercholesteremia 07/04/2017  . Type 2 diabetes mellitus without complication, without long-term current use of insulin (HCC) 04/05/2017   Past Medical History:  Diagnosis Date  . Diabetes mellitus without complication (HCC)   . ETOH abuse   . Hypercholesteremia   . Hypertension   . Suicidal ideation     History reviewed. No pertinent family history.  Past Surgical History:  Procedure Laterality Date  . FOOT SURGERY    . KNEE SURGERY     Social History   Occupational History  .  Occupation: Unemployeed  Tobacco Use  . Smoking status: Never Smoker  . Smokeless tobacco: Current User    Types: Snuff  . Tobacco comment: daily since 55 years old  Substance and Sexual Activity  . Alcohol use: Yes    Alcohol/week: 0.0 standard drinks    Comment: beer - daily 1/2 a case   . Drug use: No  . Sexual activity: Not Currently

## 2021-02-28 ENCOUNTER — Ambulatory Visit: Payer: Medicaid Other | Attending: Orthopaedic Surgery

## 2021-03-07 ENCOUNTER — Ambulatory Visit: Payer: Medicaid Other | Admitting: Internal Medicine

## 2021-03-17 ENCOUNTER — Other Ambulatory Visit: Payer: Self-pay

## 2021-03-17 ENCOUNTER — Ambulatory Visit (INDEPENDENT_AMBULATORY_CARE_PROVIDER_SITE_OTHER): Payer: Medicaid Other | Admitting: Orthopaedic Surgery

## 2021-03-17 ENCOUNTER — Encounter: Payer: Self-pay | Admitting: Orthopaedic Surgery

## 2021-03-17 VITALS — Ht 66.0 in | Wt 206.0 lb

## 2021-03-17 DIAGNOSIS — M1712 Unilateral primary osteoarthritis, left knee: Secondary | ICD-10-CM

## 2021-03-17 NOTE — Progress Notes (Signed)
Office Visit Note   Patient: Adrian Schultz           Date of Birth: 1966-06-28           MRN: [id number] Visit Date: 03/17/2021              Requested by: Marcine Matar, MD [address] [place],  [place] [address] PCP: Marcine Matar, MD   Assessment & Plan: Visit Diagnoses:  1. Primary osteoarthritis of left knee     Plan: From my standpoint Adrian Schultz is doing well and I have encouraged him to continue to lose weight and strengthen his legs.  We made another referral to outpatient physical therapy for him.  We will see him back as needed.  Follow-Up Instructions: Return if symptoms worsen or fail to improve.   Orders:  No orders of the defined types were placed in this encounter.  No orders of the defined types were placed in this encounter.     Procedures: No procedures performed   Clinical Data: No additional findings.   Subjective: Chief Complaint  Patient presents with  . Left Knee - Pain    Adrian Schultz is a 55 year old gentleman following up for left knee patellofemoral arthritis.  He had cortisone injection in the left knee about 3 months ago.  He states that he still feels relief.  He would like another referral to physical therapy.  He has lost 11 pounds.  Not interested in cortisone injection today.  Denies any changes otherwise.   Review of Systems  Constitutional: Negative.   All other systems reviewed and are negative.    Objective: Vital Signs: Ht 5\' 6"  (1.676 m)   Wt 206 lb (93.4 kg)   BMI 33.25 kg/m   Physical Exam Vitals and nursing note reviewed.  Constitutional:      Appearance: He is well-developed.  Pulmonary:     Effort: Pulmonary effort is normal.  Abdominal:     Palpations: Abdomen is soft.  Skin:    General: Skin is warm.  Neurological:     Mental Status: He is alert and oriented to person, place, and time.  Psychiatric:        Behavior: Behavior normal.        Thought Content: Thought content normal.        Judgment:  Judgment normal.     Ortho Exam Left knee shows no joint effusion.  Decent range of motion with patellofemoral crepitus.  Collaterals and cruciates are stable. Specialty Comments:  No specialty comments available.  Imaging: No results found.   PMFS History: Patient Active Problem List   Diagnosis Date Noted  . Psychosis (HCC) 08/12/2019  . Microalbuminuria 01/30/2019  . Alcohol use disorder, severe, in early remission (HCC) 12/05/2018  . Chronic pain of left knee 05/08/2018  . Primary localized osteoarthritis of left knee 12/27/2017  . Cervicalgia 12/27/2017  . Anxiety 12/27/2017  . History of panic attacks 12/27/2017  . Chronic right shoulder pain 12/27/2017  . Essential hypertension 10/04/2017  . Alcohol abuse with alcohol-induced mood disorder (HCC) 09/06/2017  . Hypercholesteremia 07/04/2017  . Type 2 diabetes mellitus without complication, without long-term current use of insulin (HCC) 04/05/2017   Past Medical History:  Diagnosis Date  . Diabetes mellitus without complication (HCC)   . ETOH abuse   . Hypercholesteremia   . Hypertension   . Suicidal ideation     History reviewed. No pertinent family history.  Past Surgical History:  Procedure Laterality Date  .  FOOT SURGERY    . KNEE SURGERY     Social History   Occupational History  . Occupation: Unemployeed  Tobacco Use  . Smoking status: Never Smoker  . Smokeless tobacco: Current User    Types: Snuff  . Tobacco comment: daily since 55 years old  Substance and Sexual Activity  . Alcohol use: Yes    Alcohol/week: 0.0 standard drinks    Comment: beer - daily 1/2 a case   . Drug use: No  . Sexual activity: Not Currently

## 2021-05-03 ENCOUNTER — Ambulatory Visit: Payer: Medicaid Other | Admitting: Physician Assistant

## 2021-05-10 ENCOUNTER — Other Ambulatory Visit: Payer: Self-pay

## 2021-05-10 ENCOUNTER — Ambulatory Visit: Payer: Medicaid Other | Attending: Physician Assistant | Admitting: Physician Assistant

## 2021-05-10 VITALS — BP 134/88 | HR 90 | Resp 16 | Ht 66.5 in | Wt 196.6 lb

## 2021-05-10 DIAGNOSIS — Z91199 Patient's noncompliance with other medical treatment and regimen due to unspecified reason: Secondary | ICD-10-CM

## 2021-05-10 DIAGNOSIS — Z79899 Other long term (current) drug therapy: Secondary | ICD-10-CM | POA: Diagnosis not present

## 2021-05-10 DIAGNOSIS — E782 Mixed hyperlipidemia: Secondary | ICD-10-CM | POA: Diagnosis not present

## 2021-05-10 DIAGNOSIS — Z9114 Patient's other noncompliance with medication regimen: Secondary | ICD-10-CM | POA: Diagnosis not present

## 2021-05-10 DIAGNOSIS — M5412 Radiculopathy, cervical region: Secondary | ICD-10-CM

## 2021-05-10 DIAGNOSIS — F419 Anxiety disorder, unspecified: Secondary | ICD-10-CM | POA: Diagnosis not present

## 2021-05-10 DIAGNOSIS — I10 Essential (primary) hypertension: Secondary | ICD-10-CM

## 2021-05-10 DIAGNOSIS — Z794 Long term (current) use of insulin: Secondary | ICD-10-CM | POA: Diagnosis not present

## 2021-05-10 DIAGNOSIS — E1165 Type 2 diabetes mellitus with hyperglycemia: Secondary | ICD-10-CM | POA: Insufficient documentation

## 2021-05-10 DIAGNOSIS — Z9119 Patient's noncompliance with other medical treatment and regimen: Secondary | ICD-10-CM

## 2021-05-10 DIAGNOSIS — E119 Type 2 diabetes mellitus without complications: Secondary | ICD-10-CM

## 2021-05-10 LAB — POCT URINALYSIS DIP (CLINITEK)
Bilirubin, UA: NEGATIVE
Glucose, UA: >=1000 mg/dL — AB
Leukocytes, UA: NEGATIVE
Nitrite, UA: NEGATIVE
POC PROTEIN,UA: NEGATIVE
Spec Grav, UA: 1.01 (ref 1.010–1.025)
Urobilinogen, UA: 0.2 E.U./dL
pH, UA: 5.5 (ref 5.0–8.0)

## 2021-05-10 LAB — GLUCOSE, POCT (MANUAL RESULT ENTRY)
POC Glucose: 364 mg/dl — AB (ref 70–99)
POC Glucose: 325 mg/dl — AB (ref 70–99)

## 2021-05-10 LAB — POCT GLYCOSYLATED HEMOGLOBIN (HGB A1C): HbA1c, POC (controlled diabetic range): 11.2 % — AB (ref 0.0–7.0)

## 2021-05-10 MED ORDER — ATORVASTATIN CALCIUM 10 MG PO TABS
ORAL_TABLET | Freq: Every day | ORAL | 1 refills | Status: DC
  Filled 2021-05-10: qty 30, 30d supply, fill #0

## 2021-05-10 MED ORDER — MELOXICAM 15 MG PO TABS
15.0000 mg | ORAL_TABLET | Freq: Every day | ORAL | 0 refills | Status: DC
  Filled 2021-05-10: qty 30, 30d supply, fill #0

## 2021-05-10 MED ORDER — LOSARTAN POTASSIUM 50 MG PO TABS
ORAL_TABLET | Freq: Every day | ORAL | 6 refills | Status: DC
  Filled 2021-05-10: qty 30, 30d supply, fill #0

## 2021-05-10 MED ORDER — PREGABALIN 50 MG PO CAPS
ORAL_CAPSULE | ORAL | 3 refills | Status: DC
  Filled 2021-05-10: qty 60, 30d supply, fill #0

## 2021-05-10 MED ORDER — GLIPIZIDE 5 MG PO TABS
5.0000 mg | ORAL_TABLET | Freq: Two times a day (BID) | ORAL | 3 refills | Status: DC
  Filled 2021-05-10: qty 60, 30d supply, fill #0

## 2021-05-10 MED ORDER — ESCITALOPRAM OXALATE 20 MG PO TABS
ORAL_TABLET | Freq: Every day | ORAL | 2 refills | Status: DC
  Filled 2021-05-10: qty 30, 30d supply, fill #0

## 2021-05-10 MED ORDER — INSULIN ASPART 100 UNIT/ML IJ SOLN
20.0000 [IU] | Freq: Once | INTRAMUSCULAR | Status: AC
  Administered 2021-05-10: 20 [IU] via SUBCUTANEOUS

## 2021-05-10 MED ORDER — METFORMIN HCL 1000 MG PO TABS
ORAL_TABLET | Freq: Two times a day (BID) | ORAL | 3 refills | Status: DC
  Filled 2021-05-10: qty 60, 30d supply, fill #0

## 2021-05-10 NOTE — Progress Notes (Signed)
 [name], is a 55 y.o. male  CSN:[id number]  MRN:[id number]  DOB - 09/24/1966  Subjective:  Chief Complaint and HPI: Adrian Schultz is a 55 y.o. male here today for med RF and check up.  Not taking lantus.  Takes some of his meds sometimes.  Overall non-compliant.  Says he won't take insulin so "don't send RF."  Doesn't have the money to get all of his meds.  Drinks beer, no liquor.  Poor diet. C/o nerve pain in legs but not taking lyrica.  Basically after lengthy conversation, it sounds as though he takes metformin occasionally and losartan occasionally. He says he is afraid of what all these meds will do to his kidneys and liver and organs  ROS:   Constitutional:  No f/c, No night sweats, No unexplained weight loss. EENT:  No vision changes, No blurry vision, No hearing changes. No mouth, throat, or ear problems.  Respiratory: No cough, No SOB Cardiac: No CP, no palpitations GI:  No abd pain, No N/V/D. GU: No Urinary s/sx Musculoskeletal: leg and back pain Neuro: No headache, no dizziness, no motor weakness.  Skin: No rash Endocrine:  No polydipsia. No polyuria.  Psych: Denies SI/HI  No problems updated.  ALLERGIES: Allergies  Allergen Reactions   Lisinopril Cough    Tolerates Losartan (home med)   Buspar [Buspirone]     Cause nause    PAST MEDICAL HISTORY: Past Medical History:  Diagnosis Date   Diabetes mellitus without complication (HCC)    ETOH abuse    Hypercholesteremia    Hypertension    Suicidal ideation     MEDICATIONS AT HOME: Prior to Admission medications   Medication Sig Start Date End Date Taking? Authorizing Provider  Accu-Chek Softclix Lancets lancets USE AS INSTRUCTED TO CHECK BLOOD SUGAR ONCE DAILY. Patient taking differently: USE AS INSTRUCTED TO CHECK BLOOD SUGAR ONCE DAILY. 08/09/20 08/09/21  Johnson, Deborah B, MD  atorvastatin (LIPITOR) 10 MG tablet TAKE 1 TABLET (10 MG TOTAL) BY MOUTH DAILY. 05/10/21 05/10/22  ,  M, PA-C  Blood  Glucose Monitoring Suppl (ACCU-CHEK GUIDE ME) w/Device KIT USE AS INSTRUCTED TO CHECK BLOOD SUGAR ONCE DAILY. 12/19/20 12/19/21  Johnson, Deborah B, MD  escitalopram (LEXAPRO) 20 MG tablet TAKE 1 TABLET (20 MG TOTAL) BY MOUTH DAILY. 05/10/21 05/10/22  ,  M, PA-C  glipiZIDE (GLUCOTROL) 5 MG tablet Take 1 tablet (5 mg total) by mouth 2 (two) times daily before a meal. 05/10/21  Yes ,  M, PA-C  glucose blood test strip USE AS INSTRUCTED TO CHECK BLOOD SUGAR ONCE DAILY. Patient taking differently: USE AS INSTRUCTED TO CHECK BLOOD SUGAR ONCE DAILY. 08/09/20 08/09/21  Johnson, Deborah B, MD  Insulin Pen Needle 32G X 4 MM MISC USE TO INJECT INSULIN ONCE DAILY. Patient not taking: Reported on 05/10/2021 11/01/20 11/01/21  Johnson, Deborah B, MD  losartan (COZAAR) 50 MG tablet TAKE 1 TABLET (50 MG TOTAL) BY MOUTH DAILY. 05/10/21 05/10/22  ,  M, PA-C  metFORMIN (GLUCOPHAGE) 1000 MG tablet TAKE 1 TABLET (1,000 MG TOTAL) BY MOUTH 2 (TWO) TIMES DAILY WITH A MEAL. 05/10/21 05/10/22  ,  M, PA-C  pregabalin (LYRICA) 50 MG capsule Take 1 capsule by mouth twice a day as directed by physician. 05/10/21   ,  M, PA-C  glimepiride (AMARYL) 4 MG tablet Take 1 tablet (4 mg total) by mouth 2 (two) times daily. 09/29/20 11/01/20  Stephens, Amy J, NP     Objective:  EXAM:   Vitals:     05/10/21 0841  BP: 134/88  Pulse: 90  Resp: 16  SpO2: 95%  Weight: 196 lb 9.6 oz (89.2 kg)  Height: 5' 6.5" (1.689 m)    General appearance : A&OX3. NAD. Non-toxic-appearing HEENT: Atraumatic and Normocephalic.  PERRLA. EOM intact.  Neck: supple, no JVD. No cervical lymphadenopathy. No thyromegaly Chest/Lungs:  Breathing-non-labored, Good air entry bilaterally, breath sounds normal without rales, rhonchi, or wheezing  CVS: S1 S2 regular, no murmurs, gallops, rubs  Extremities: Bilateral Lower Ext shows no edema, both legs are warm to touch with = pulse throughout Neurology:  CN II-XII  grossly intact, Non focal.   Psych:  TP scattered. J/I poor. Normal speech. Appropriate eye contact and affect.  Skin:  No Rash  Data Review Lab Results  Component Value Date   HGBA1C 11.2 (A) 05/10/2021   HGBA1C 11.8 (A) 09/29/2020   HGBA1C 9.2 (H) 08/12/2019     Assessment & Plan   1. Type 2 diabetes mellitus without complication, without long-term current use of insulin (HCC) Uncontrolled/non-compliance.  He said he would not take lantus but "might take" an additional oral agent.  I explained to him at length the dangers and comorbidities associated with uncontrolled health issues, ESP DIABETES!!!! - Glucose (CBG) - HgB A1c - Microalbumin / creatinine urine ratio - POCT URINALYSIS DIP (CLINITEK) - insulin aspart (novoLOG) injection 20 Units - metFORMIN (GLUCOPHAGE) 1000 MG tablet; TAKE 1 TABLET (1,000 MG TOTAL) BY MOUTH 2 (TWO) TIMES DAILY WITH A MEAL.  Dispense: 60 tablet; Refill: 3 - losartan (COZAAR) 50 MG tablet; TAKE 1 TABLET (50 MG TOTAL) BY MOUTH DAILY.  Dispense: 30 tablet; Refill: 6 - glipiZIDE (GLUCOTROL) 5 MG tablet; Take 1 tablet (5 mg total) by mouth 2 (two) times daily before a meal.  Dispense: 60 tablet; Refill: 3 - Comprehensive metabolic panel  2. Anxiety resume- escitalopram (LEXAPRO) 20 MG tablet; TAKE 1 TABLET (20 MG TOTAL) BY MOUTH DAILY.  Dispense: 30 tablet; Refill: 2  3. Cervical radiculopathy - pregabalin (LYRICA) 50 MG capsule; Take 1 capsule by mouth twice a day as directed by physician.  Dispense: 180 capsule; Refill: 3  4. Essential hypertension - losartan (COZAAR) 50 MG tablet; TAKE 1 TABLET (50 MG TOTAL) BY MOUTH DAILY.  Dispense: 30 tablet; Refill: 6  5. Mixed hyperlipidemia - atorvastatin (LIPITOR) 10 MG tablet; TAKE 1 TABLET (10 MG TOTAL) BY MOUTH DAILY.  Dispense: 30 tablet; Refill: 1  6. Non-compliance I explained to him at length the dangers and comorbidities associated with uncontrolled health issues, ESP DIABETES!!!!   Patient have  been counseled extensively about nutrition and exercise  Return for luke in 3 weeks for DM and 3 months with PCP.  The patient was given clear instructions to go to ER or return to medical center if symptoms don't improve, worsen or new problems develop. The patient verbalized understanding. The patient was told to call to get lab results if they haven't heard anything in the next week.     Freeman Caldron, PA-C [facility], [place]   05/10/2021, 9:10 AM Patient ID: KEILON RESSEL, male   DOB: [birth year], 55 y.o.   MRN: [id number]

## 2021-05-10 NOTE — Patient Instructions (Signed)
Check blood sugars fasting and bedtime and record.  Bring numbers to next visit.  Follow diabetic diet

## 2021-05-11 LAB — COMPREHENSIVE METABOLIC PANEL
ALT: 20 IU/L (ref 0–44)
AST: 24 IU/L (ref 0–40)
Albumin/Globulin Ratio: 1.4 (ref 1.2–2.2)
Albumin: 4.1 g/dL (ref 3.8–4.9)
Alkaline Phosphatase: 102 IU/L (ref 44–121)
BUN/Creatinine Ratio: 11 (ref 9–20)
BUN: 8 mg/dL (ref 6–24)
Bilirubin Total: 1.3 mg/dL — ABNORMAL HIGH (ref 0.0–1.2)
CO2: 18 mmol/L — ABNORMAL LOW (ref 20–29)
Calcium: 9 mg/dL (ref 8.7–10.2)
Chloride: 92 mmol/L — ABNORMAL LOW (ref 96–106)
Creatinine, Ser: 0.73 mg/dL — ABNORMAL LOW (ref 0.76–1.27)
Globulin, Total: 3 g/dL (ref 1.5–4.5)
Glucose: 328 mg/dL — ABNORMAL HIGH (ref 65–99)
Potassium: 4.1 mmol/L (ref 3.5–5.2)
Sodium: 136 mmol/L (ref 134–144)
Total Protein: 7.1 g/dL (ref 6.0–8.5)
eGFR: 109 mL/min/{1.73_m2} (ref >59)

## 2021-05-11 LAB — MICROALBUMIN / CREATININE URINE RATIO
Creatinine, Urine: 55.6 mg/dL
Microalb/Creat Ratio: 109 mg/g creat — ABNORMAL HIGH (ref 0–29)
Microalbumin, Urine: 60.5 ug/mL

## 2021-05-11 NOTE — Progress Notes (Signed)
Pt name and DOB verified. Patient aware of results and result note per Angela McClung, PA-C 

## 2021-05-17 ENCOUNTER — Other Ambulatory Visit: Payer: Self-pay

## 2021-06-06 ENCOUNTER — Other Ambulatory Visit: Payer: Self-pay

## 2021-06-06 ENCOUNTER — Ambulatory Visit: Payer: Medicaid Other | Attending: Internal Medicine | Admitting: Pharmacist

## 2021-06-06 ENCOUNTER — Encounter: Payer: Self-pay | Admitting: Pharmacist

## 2021-06-06 DIAGNOSIS — E119 Type 2 diabetes mellitus without complications: Secondary | ICD-10-CM

## 2021-06-06 LAB — GLUCOSE, POCT (MANUAL RESULT ENTRY): POC Glucose: 389 mg/dl — AB (ref 70–99)

## 2021-06-06 MED ORDER — LANTUS SOLOSTAR 100 UNIT/ML ~~LOC~~ SOPN
10.0000 [IU] | PEN_INJECTOR | Freq: Every day | SUBCUTANEOUS | 0 refills | Status: DC
  Filled 2021-06-06: qty 3, 30d supply, fill #0

## 2021-06-06 NOTE — Patient Instructions (Signed)
Thank you for coming to see me today. Please do the following:  Start Lantus. Take 10 units in the evening.  Take metformin in the morning and in the evening. You should be taking 1 pill in the morning with breakfast and 1 pill in the evening with supper.  Continue checking blood sugars at home.   Continue making the lifestyle changes we've discussed together during our visit. Diet and exercise play a significant role in improving your blood sugars.  Follow-up with min 2 to 3 weeks.     Hypoglycemia or low blood sugar:   Low blood sugar can happen quickly and may become an emergency if not treated right away.   While this shouldn't happen often, it can be brought upon if you skip a meal or do not eat enough. Also, if your insulin or other diabetes medications are dosed too high, this can cause your blood sugar to go to low.   Warning signs of low blood sugar include: Feeling shaky or dizzy Feeling weak or tired  Excessive hunger Feeling anxious or upset  Sweating even when you aren't exercising  What to do if I experience low blood sugar? Check your blood sugar with your meter. If lower than 70, proceed to step 2.  Treat with 3-4 glucose tablets or 3 packets of regular sugar. If these aren't around, you can try hard candy. Yet another option would be to drink 4 ounces of fruit juice or 6 ounces of REGULAR soda.  Re-check your sugar in 15 minutes. If it is still below 70, do what you did in step 2 again. If has come back up, go ahead and eat a snack or small meal at this time.

## 2021-06-06 NOTE — Progress Notes (Signed)
    S:     No chief complaint on file.  Patient arrives well and in good spirits.  Presents for diabetes evaluation, education, and management. Patient was referred and last seen by Marylene Land on 05/10/2021. At that visit, patient reported medication noncompliance. He told Marylene Land he did not want to start insulin.   Today, patient reports appropriate adherence to his metformin. He did not start the glipizide. He reports difficulty remembering to take his medications, as well as difficulty remembering what his medications are for. He's a poor historian. Today, I asked him why he refused insulin and he tells me he ran out of refills. He reports good results with insulin when pharmacy saw him and started it last December.   Insurance coverage/medication affordability: Medicaid   Medication adherence denied.  Current diabetes medications include: metformin 1g BID, glipizide 5mg  BID (not taking)  Patient denies hypoglycemic events.  Patient reported dietary habits: Eats 2 meals/day Breakfast: grits, eggs, biscuits, sausage Lunch and dinner: corn, green beans, chicken, pork chops  Patient-reported exercise habits: none    Patient reports nocturia (nighttime urination).  Patient reports neuropathy (nerve pain). Patient reports visual changes. Patient denies self foot exams.     O:  POCT: 389  Lab Results  Component Value Date   HGBA1C 11.2 (A) 05/10/2021   There were no vitals filed for this visit.  Lipid Panel     Component Value Date/Time   CHOL 224 (H) 09/29/2020 1340   TRIG 206 (H) 09/29/2020 1340   HDL 43 09/29/2020 1340   CHOLHDL 5.2 (H) 09/29/2020 1340   CHOLHDL 4.4 08/12/2019 1845   VLDL UNABLE TO CALCULATE IF TRIGLYCERIDE OVER 400 mg/dL 08/14/2019 74/06/1447   LDLCALC 144 (H) 09/29/2020 1340   LDLDIRECT 75.0 08/12/2019 1845      Clinical Atherosclerotic Cardiovascular Disease (ASCVD): No  The 10-year ASCVD risk score 08/14/2019 DC Jr., et al., 2013) is: 13.4%   Values used to  calculate the score:     Age: 55 years     Sex: Male     Is Non-Hispanic African American: No     Diabetic: Yes     Tobacco smoker: No     Systolic Blood Pressure: 134 mmHg     Is BP treated: Yes     HDL Cholesterol: 43 mg/dL     Total Cholesterol: 224 mg/dL    A/P: Diabetes longstanding currently uncontrolled. Patient is able to verbalize appropriate hypoglycemia management plan. Medication adherence has historically been an issue. Control is suboptimal due to dietary indiscretion, physical inactivity, and medication noncompliance. Patient has previously been very resistant to injectables. Discussed the need for insulin today. Pt is amenable to starting this.  -Continue metformin 1g BID -Started Lantus insulin 10 units daily. Discussed appropriate injection technique. Patient practiced with demo pens, as well as injected himself with a insulin needle. -Stopped glipizide.  -Extensively discussed pathophysiology of diabetes, recommended lifestyle interventions, dietary effects on blood sugar control -Counseled on s/sx of and management of hypoglycemia -Next A1C anticipated September 2022.   Written patient instructions provided.  Total time in face to face counseling 25 minutes.   Follow up Pharmacist Clinic Visit in 2 weeks.    October 2022, PharmD, Butch Penny, CPP Clinical Pharmacist [facility] [phone]

## 2021-06-20 ENCOUNTER — Ambulatory Visit: Payer: Medicaid Other | Admitting: Pharmacist

## 2021-06-27 ENCOUNTER — Ambulatory Visit: Payer: Medicaid Other | Admitting: Pharmacist

## 2021-07-04 ENCOUNTER — Ambulatory Visit: Payer: Medicaid Other | Attending: Internal Medicine | Admitting: Pharmacist

## 2021-07-04 ENCOUNTER — Other Ambulatory Visit: Payer: Self-pay

## 2021-07-04 ENCOUNTER — Encounter: Payer: Self-pay | Admitting: Pharmacist

## 2021-07-04 DIAGNOSIS — E119 Type 2 diabetes mellitus without complications: Secondary | ICD-10-CM | POA: Diagnosis not present

## 2021-07-04 DIAGNOSIS — Z794 Long term (current) use of insulin: Secondary | ICD-10-CM

## 2021-07-04 MED ORDER — ACCU-CHEK GUIDE VI STRP
ORAL_STRIP | 2 refills | Status: DC
  Filled 2021-07-04: qty 100, 33d supply, fill #0

## 2021-07-04 MED ORDER — ACCU-CHEK GUIDE ME W/DEVICE KIT
PACK | 0 refills | Status: DC
  Filled 2021-07-04: qty 1, 1d supply, fill #0

## 2021-07-04 MED ORDER — ACCU-CHEK SOFTCLIX LANCETS MISC
2 refills | Status: DC
  Filled 2021-07-04: qty 100, 33d supply, fill #0

## 2021-07-04 MED ORDER — LANTUS SOLOSTAR 100 UNIT/ML ~~LOC~~ SOPN
16.0000 [IU] | PEN_INJECTOR | Freq: Every day | SUBCUTANEOUS | 0 refills | Status: DC
Start: 2021-07-04 — End: 2021-11-09
  Filled 2021-07-04: qty 12, 75d supply, fill #0

## 2021-07-04 NOTE — Progress Notes (Signed)
    S:     No chief complaint on file.  Patient arrives in good spirits.  Presents for diabetes evaluation, education, and management. Patient was last seen by [facility] on 06/06/2021. At that visit Lantus was restarted at 10 units every evening.  Social History: Hx of alcohol abuse; currently drinks only beer every other day  Insurance coverage/medication affordability: Medicaid  Medication adherence: pt reports taking metformin BID (not taken this morning) and injecting Lantus QAM   Current diabetes medications include: Metformin 1000 mg BID, Lantus 10 units QPM (pt injects QAM) Current hypertension medications include: losartan 50 mg daily Current hyperlipidemia medications include: atorvastatin 10 mg daily  Patient denies hypoglycemic events.  Patient reported dietary habits:  -does not eat regular or consistent meals -reports snacking on foods from [employer] or eating fast foods such as [employer], drinks a lot of orange juice and milk, and occasionally drinks sodas; also regularly eats mini candy bars.  Patient-reported exercise habits: very little exercise d/t to pain, but will do push-ups on the window sill and stretching   Patient denies nocturia (nighttime urination).    O:   POCT: 403 (pt reports not eating or taking metformin this morning but drinking a large amount of orange juice)  Lab Results  Component Value Date   HGBA1C 11.2 (A) 05/10/2021   There were no vitals filed for this visit.  Lipid Panel     Component Value Date/Time   CHOL 224 (H) 09/29/2020 1340   TRIG 206 (H) 09/29/2020 1340   HDL 43 09/29/2020 1340   CHOLHDL 5.2 (H) 09/29/2020 1340   CHOLHDL 4.4 08/12/2019 1845   VLDL UNABLE TO CALCULATE IF TRIGLYCERIDE OVER 400 mg/dL 78/24/2353 6144   LDLCALC 144 (H) 09/29/2020 1340   LDLDIRECT 75.0 08/12/2019 1845    Home fasting blood sugars: pt reports in the 200s  2 hour post-meal/random blood sugars: none to report  Clinical Atherosclerotic  Cardiovascular Disease (ASCVD): No  The 10-year ASCVD risk score Denman George DC Jr., et al., 2013) is: 13.4%   Values used to calculate the score:     Age: 55 years     Sex: Male     Is Non-Hispanic African American: No     Diabetic: Yes     Tobacco smoker: No     Systolic Blood Pressure: 134 mmHg     Is BP treated: Yes     HDL Cholesterol: 43 mg/dL     Total Cholesterol: 224 mg/dL    A/P: Diabetes longstanding currently uncontrolled. Patient is able to verbalize appropriate hypoglycemia management plan. Medication adherence has been an issue in the past, but pt reports currently adherent. Control is suboptimal due to poor eating habits and lack of physical activity. -Increased dose of basal insulin to 16 units.  -Continued metformin 1000 mg BID -Extensively discussed pathophysiology of diabetes, recommended lifestyle interventions, dietary effects on blood sugar control -Counseled on s/sx of and management of hypoglycemia -Next A1C anticipated September 2022.   ASCVD risk - primary prevention in patient with diabetes. Last LDL is not controlled. Increasing atorvastatin to 40 mg a future consideration if LDL is still above goal.  Written patient instructions provided.  Total time in face to face counseling 30 minutes.   Follow up Clinic Visit with [facility] in 3 weeks.    Mitzie Na, PharmD Pharmacy Resident [facility] [phone]

## 2021-07-11 ENCOUNTER — Other Ambulatory Visit: Payer: Self-pay

## 2021-07-18 ENCOUNTER — Other Ambulatory Visit: Payer: Self-pay

## 2021-08-01 ENCOUNTER — Ambulatory Visit: Payer: Medicaid Other | Admitting: Pharmacist

## 2021-08-01 NOTE — Progress Notes (Unsigned)
S:     No chief complaint on file.  Patient arrives in good spirits.  Presents for diabetes evaluation, education, and management. Patient was last seen by PCP on 05/10/2021 and last seen by pharmacy on 07/04/2021. At last pharmacy visit, Lantus (insulin glargine) was increased from 10 units to 16 units and atorvastatin was increased from 10 mg to 40 mg.   Social History: Hx of alcohol abuse; currently drinks only beer every other day  Insurance coverage/medication affordability: Medicaid  Medication adherence: pt reports taking metformin BID (not taken this morning) and Lantus (insulin glargine) 16 units QAM   Current diabetes medications include: Metformin 1000 mg BID, Lantus 16 units QPM (pt injects QAM), pregabalin (Lyrica) 50 mg BID Current hypertension medications include: losartan 50 mg daily Current hyperlipidemia medications include: atorvastatin 40 mg daily  Patient {Actions; denies-reports:120008} hypoglycemic events.  Patient reported dietary habits: Eats *** meals/day Breakfast:*** Lunch:*** Dinner:*** Snacks:*** Drinks:***  Patient-reported exercise habits: ***   Patient {Actions; denies-reports:120008} nocturia (nighttime urination).  Patient {Actions; denies-reports:120008} neuropathy (nerve pain). Patient {Actions; denies-reports:120008} visual changes. Patient {Actions; denies-reports:120008} self foot exams.   Patient reported dietary habits:  -does not eat regular or consistent meals -reports snacking on foods from [employer] or eating fast foods such as [employer], drinks a lot of orange juice and milk, and occasionally drinks sodas; also regularly eats mini candy bars.  Patient-reported exercise habits: very little exercise d/t to pain, but will do push-ups on the window sill and stretching   Patient denies nocturia (nighttime urination).    O:   POCT: 403 (pt reports not eating or taking metformin this morning but drinking a large amount of  orange juice)  Lab Results  Component Value Date   HGBA1C 11.2 (A) 05/10/2021   There were no vitals filed for this visit.  Lipid Panel     Component Value Date/Time   CHOL 224 (H) 09/29/2020 1340   TRIG 206 (H) 09/29/2020 1340   HDL 43 09/29/2020 1340   CHOLHDL 5.2 (H) 09/29/2020 1340   CHOLHDL 4.4 08/12/2019 1845   VLDL UNABLE TO CALCULATE IF TRIGLYCERIDE OVER 400 mg/dL 93/71/6967 8938   LDLCALC 144 (H) 09/29/2020 1340   LDLDIRECT 75.0 08/12/2019 1845    Home fasting blood sugars: pt reports in the 200s  2 hour post-meal/random blood sugars: none to report  Clinical Atherosclerotic Cardiovascular Disease (ASCVD): No  The 10-year ASCVD risk score Denman George DC Jr., et al., 2013) is: 13.4%   Values used to calculate the score:     Age: 55 years     Sex: Male     Is Non-Hispanic African American: No     Diabetic: Yes     Tobacco smoker: No     Systolic Blood Pressure: 134 mmHg     Is BP treated: Yes     HDL Cholesterol: 43 mg/dL     Total Cholesterol: 224 mg/dL    A/P: Diabetes longstanding currently uncontrolled. Patient is able to verbalize appropriate hypoglycemia management plan. Medication adherence has been an issue in the past, but pt reports currently adherent. Control is suboptimal due to poor eating habits and lack of physical activity. -Increased dose of basal insulin to 16 units.  -Continued metformin 1000 mg BID -Extensively discussed pathophysiology of diabetes, recommended lifestyle interventions, dietary effects on blood sugar control -Counseled on s/sx of and management of hypoglycemia -Next A1C anticipated September 2022.   ASCVD risk - primary prevention in patient with diabetes. Last LDL  is not controlled. Increasing atorvastatin to 40 mg a future consideration if LDL is still above goal.  Written patient instructions provided.  Total time in face to face counseling 30 minutes.   Follow up Clinic Visit with [facility] in 3 weeks.    Valeda Malm,  Pharm.D. PGY-1 Ambulatory Care Resident 08/01/2021 8:16 AM       S:     No chief complaint on file.   Patient arrives ***.  Presents for diabetes evaluation, education, and management Patient was referred and last seen by Primary Care Provider on ***.  Patient was referred on ***.  Patient was last seen by Primary Care Provider on ***.   Patient reports Diabetes was diagnosed in ***.   Family/Social History: ***  Insurance coverage/medication affordability: ***  Medication adherence reported *** .   Current diabetes medications include: *** Current hypertension medications include: *** Current hyperlipidemia medications include: ***  Patient {Actions; denies-reports:120008} hypoglycemic events.  Patient reported dietary habits: Eats *** meals/day Breakfast:*** Lunch:*** Dinner:*** Snacks:*** Drinks:***  Patient-reported exercise habits: ***   Patient {Actions; denies-reports:120008} nocturia (nighttime urination).  Patient {Actions; denies-reports:120008} neuropathy (nerve pain). Patient {Actions; denies-reports:120008} visual changes. Patient {Actions; denies-reports:120008} self foot exams.     O:  Physical Exam  ROS  Lab Results  Component Value Date   HGBA1C 11.2 (A) 05/10/2021   There were no vitals filed for this visit.  Lipid Panel     Component Value Date/Time   CHOL 224 (H) 09/29/2020 1340   TRIG 206 (H) 09/29/2020 1340   HDL 43 09/29/2020 1340   CHOLHDL 5.2 (H) 09/29/2020 1340   CHOLHDL 4.4 08/12/2019 1845   VLDL UNABLE TO CALCULATE IF TRIGLYCERIDE OVER 400 mg/dL 76/73/4193 7902   LDLCALC 144 (H) 09/29/2020 1340   LDLDIRECT 75.0 08/12/2019 1845    Home fasting blood sugars: ***  2 hour post-meal/random blood sugars: ***.  Clinical Atherosclerotic Cardiovascular Disease (ASCVD): {YES/NO:21197} The 10-year ASCVD risk score Denman George DC Jr., et al., 2013) is: 13.4%   Values used to calculate the score:     Age: 55 years     Sex: Male      Is Non-Hispanic African American: No     Diabetic: Yes     Tobacco smoker: No     Systolic Blood Pressure: 134 mmHg     Is BP treated: Yes     HDL Cholesterol: 43 mg/dL     Total Cholesterol: 224 mg/dL    A/P: Diabetes longstanding*** currently ***. Patient is *** able to verbalize appropriate hypoglycemia management plan. Medication adherence appears ***. Control is suboptimal due to ***. -{Meds adjust:18428} basal insulin *** (insulin ***). Patient will continue to titrate 1 unit every *** days if fasting blood sugar > 100mg /dl until fasting blood sugars reach goal or next visit.  -{Meds adjust:18428}  rapid insulin *** (insulin ***) to ***.  -{Meds adjust:18428} GLP-1 *** (generic name***) to ***.  -{Meds adjust:18428} SGLT2-I *** (generic name***) to ***. Counseled on sick day rules for ***. -Extensively discussed pathophysiology of diabetes, recommended lifestyle interventions, dietary effects on blood sugar control -Counseled on s/sx of and management of hypoglycemia -Next A1C anticipated ***.   ASCVD risk - primary***secondary prevention in patient with diabetes. Last LDL {Is/is not:9024} controlled. ASCVD risk score {Is/is not:9024} >20%  - {Desc; low/moderate/high:110033} intensity statin indicated. Aspirin {Is/is not:9024} indicated.  -{Meds adjust:18428} aspirin *** mg  -{Meds adjust:18428} ***statin *** mg.

## 2021-08-08 ENCOUNTER — Ambulatory Visit: Payer: Medicaid Other | Admitting: Internal Medicine

## 2021-11-09 ENCOUNTER — Other Ambulatory Visit: Payer: Self-pay

## 2021-11-09 ENCOUNTER — Encounter: Payer: Self-pay | Admitting: Internal Medicine

## 2021-11-09 ENCOUNTER — Ambulatory Visit: Payer: Medicaid Other | Attending: Internal Medicine | Admitting: Internal Medicine

## 2021-11-09 VITALS — BP 133/83 | HR 101 | Resp 16 | Wt 188.6 lb

## 2021-11-09 DIAGNOSIS — I10 Essential (primary) hypertension: Secondary | ICD-10-CM | POA: Diagnosis not present

## 2021-11-09 DIAGNOSIS — E1140 Type 2 diabetes mellitus with diabetic neuropathy, unspecified: Secondary | ICD-10-CM

## 2021-11-09 DIAGNOSIS — R809 Proteinuria, unspecified: Secondary | ICD-10-CM

## 2021-11-09 DIAGNOSIS — Z23 Encounter for immunization: Secondary | ICD-10-CM | POA: Diagnosis not present

## 2021-11-09 DIAGNOSIS — F321 Major depressive disorder, single episode, moderate: Secondary | ICD-10-CM

## 2021-11-09 DIAGNOSIS — E782 Mixed hyperlipidemia: Secondary | ICD-10-CM | POA: Diagnosis not present

## 2021-11-09 DIAGNOSIS — Z794 Long term (current) use of insulin: Secondary | ICD-10-CM

## 2021-11-09 LAB — POCT GLYCOSYLATED HEMOGLOBIN (HGB A1C): HbA1c, POC (controlled diabetic range): 11.1 % — AB (ref 0.0–7.0)

## 2021-11-09 LAB — GLUCOSE, POCT (MANUAL RESULT ENTRY): POC Glucose: 481 mg/dl — AB (ref 70–99)

## 2021-11-09 MED ORDER — ACCU-CHEK GUIDE VI STRP
ORAL_STRIP | 2 refills | Status: DC
  Filled 2021-11-09: qty 100, 33d supply, fill #0

## 2021-11-09 MED ORDER — ATORVASTATIN CALCIUM 10 MG PO TABS
ORAL_TABLET | Freq: Every day | ORAL | 1 refills | Status: DC
  Filled 2021-11-09: qty 30, 30d supply, fill #0

## 2021-11-09 MED ORDER — METFORMIN HCL 1000 MG PO TABS
1000.0000 mg | ORAL_TABLET | Freq: Two times a day (BID) | ORAL | 3 refills | Status: DC
  Filled 2021-11-09: qty 60, 30d supply, fill #0

## 2021-11-09 MED ORDER — ACCU-CHEK SOFTCLIX LANCETS MISC
2 refills | Status: DC
Start: 2021-11-09 — End: 2022-02-20
  Filled 2021-11-09: qty 100, 33d supply, fill #0

## 2021-11-09 MED ORDER — PREGABALIN 50 MG PO CAPS
ORAL_CAPSULE | ORAL | 3 refills | Status: DC
  Filled 2021-11-09: qty 60, 30d supply, fill #0

## 2021-11-09 MED ORDER — LANTUS SOLOSTAR 100 UNIT/ML ~~LOC~~ SOPN
10.0000 [IU] | PEN_INJECTOR | Freq: Every day | SUBCUTANEOUS | 3 refills | Status: DC
  Filled 2021-11-09: qty 3, 30d supply, fill #0

## 2021-11-09 MED ORDER — ACCU-CHEK GUIDE W/DEVICE KIT
PACK | 0 refills | Status: DC
  Filled 2021-11-09: qty 1, 30d supply, fill #0

## 2021-11-09 NOTE — Progress Notes (Signed)
° ° °Patient ID: Adrian Schultz, male    DOB: 06/09/1966  MRN: [id number] ° °CC: Diabetes ° ° °Subjective: °Adrian Schultz is a 54 y.o. male who presents for chronic ds management.  I last saw him 08/2019. °His concerns today include:  °Pt with hx of HTN, HL, DM with neuropathy, ETOH abuse, anxiety, chronic RT shoulder pain.  ° °DIABETES TYPE 2 °Last A1C:   °Results for orders placed or performed in visit on 11/09/21  °POCT glucose (manual entry)  °Result Value Ref Range  ° POC Glucose 481 (A) 70 - 99 mg/dl  °POCT glycosylated hemoglobin (Hb A1C)  °Result Value Ref Range  ° Hemoglobin A1C    ° HbA1c POC (<> result, manual entry)    ° HbA1c, POC (prediabetic range)    ° HbA1c, POC (controlled diabetic range) 11.1 (A) 0.0 - 7.0 %  °  °Med Adherence:Out of Lantus for  2 yrs.  Metformin last filled 04/2021 with 3 one mth refills which means he should have been out.  However patient states that he still had some bottles at home and he is taking the metformin 1000 mg twice a day.  He had a bottle with him today that he showed to me.  Not taking glimepiride mildly. °Medication side effects:  [] Yes    [] No °Home Monitoring?  [] Yes    [x] No.  Reports he needs new diabetic supplies. °Home glucose results range: °Diet Adherence: [] Yes    [x] No he eats what he can get. °Exercise: [] Yes    [x] No neuropathy in his legs and feet bother him too much.  He is not taking Lyrica.  He did not recall that this helps with neuropathy symptoms. °Hypoglycemic episodes?: [] Yes    [] No °Numbness of the feet? [x] Yes    [] No °Retinopathy hx? [] Yes    [] No °Last eye exam: Overdue for eye exam. °Comments: Endorses polyuria and polydipsia. ° °HTN: He is supposed to be on Cozaar 50 mg daily.  However he tells me that he has not been taking that either.  His blood pressure today does not look too bad.  He has history of microalbuminuria.  Denies any chest pains, shortness of breath or swelling in the lower extremities. ° °HL: Supposed to be on  Lipitor but has not been taking it.  He has a bottle with him in his backpack that has pills in it.  Patient states he was not taking it because he did not know what it was for. ° °Depression: Positive depression screen today.  Denies suicidal ideation.  He tells me he is followed by [facility] and sees someone there every 2 weeks.  He is not on Lexapro.  He states that the person he has been seeing at [facility] spoke with him on his last visit about possibly starting medication when they see him again. ° °Patient Active Problem List  ° Diagnosis Date Noted  ° Psychosis (HCC) 08/12/2019  ° Microalbuminuria 01/30/2019  ° Alcohol use disorder, severe, in early remission (HCC) 12/05/2018  ° Chronic pain of left knee 05/08/2018  ° Primary localized osteoarthritis of left knee 12/27/2017  ° Cervicalgia 12/27/2017  ° Anxiety 12/27/2017  ° History of panic attacks 12/27/2017  ° Chronic right shoulder pain 12/27/2017  ° Essential hypertension 10/04/2017  ° Alcohol abuse with alcohol-induced mood disorder (HCC) 09/06/2017  ° Hypercholesteremia 07/04/2017  ° Type 2 diabetes mellitus without complication, without long-term   long-term current use of insulin ([place]) 04/05/2017     Current Outpatient Medications on File Prior to Visit  Medication Sig Dispense Refill   losartan (COZAAR) 50 MG tablet TAKE 1 TABLET (50 MG TOTAL) BY MOUTH DAILY. (Patient not taking: Reported on 11/09/2021) 30 tablet 6   [DISCONTINUED] glimepiride (AMARYL) 4 MG tablet Take 1 tablet (4 mg total) by mouth 2 (two) times daily. 60 tablet 2   No current facility-administered medications on file prior to visit.    Allergies  Allergen Reactions   Lisinopril Cough    Tolerates Losartan (home med)   Buspar [Buspirone]     Cause nause    Social History   Socioeconomic History   Marital status: Divorced    Spouse name: Not on file   Number of children: Not on file   Years of education: Not on file   Highest education level: Not on file  Occupational  History   Occupation: Unemployeed  Tobacco Use   Smoking status: Never   Smokeless tobacco: Current    Types: Snuff   Tobacco comments:    daily since 55 years old  Substance and Sexual Activity   Alcohol use: Yes    Alcohol/week: 0.0 standard drinks    Comment: beer - daily 1/2 a case    Drug use: No   Sexual activity: Not Currently  Other Topics Concern   Not on file  Social History Narrative   Live in shelter - [place] Determinants of Health   Financial Resource Strain: Not on file  Food Insecurity: Not on file  Transportation Needs: Not on file  Physical Activity: Not on file  Stress: Not on file  Social Connections: Not on file  Intimate Partner Violence: Not on file    No family history on file.  Past Surgical History:  Procedure Laterality Date   FOOT SURGERY     KNEE SURGERY      ROS: Review of Systems Negative except as stated above  PHYSICAL EXAM: BP 133/83    Pulse (!) 101    Resp 16    Wt 188 lb 9.6 oz (85.5 kg)    SpO2 96%    BMI 29.98 kg/m   Physical Exam  General appearance -middle-age Caucasian male who appears in NAD.  Clothes are slightly unkept.  He has a cane with him and his backpack. Mental status -patient has odd affect Neck - supple, no significant adenopathy Chest - clear to auscultation, no wheezes, rales or rhonchi, symmetric air entry Heart - normal rate, regular rhythm, normal S1, S2, no murmurs, rubs, clicks or gallops Extremities - no LE edema Diabetic Foot Exam - Simple   Simple Foot Form Visual Inspection See comments: Yes Sensation Testing See comments: Yes Pulse Check Posterior Tibialis and Dorsalis pulse intact bilaterally: Yes Comments Patient has high riding arches.  Decreased sensation on the plantar surface of the feet on leap exam.     Depression screen [facility] 2/9 11/09/2021 05/10/2021 09/29/2020  Decreased Interest _0 Down, Depressed, Hopeless _1 PHQ - 2 Score _2 Altered sleeping _3 Tired, decreased energy _4 Change in appetite _5 Feeling bad or failure about yourself  _6 Trouble concentrating _7 Moving slowly or fidgety/restless 2 0 1  Suicidal thoughts 0 0 0  PHQ-9 Score _8 Some recent data  might be hidden    CMP Latest Ref Rng & Units 05/10/2021 09/29/2020 08/12/2019  Glucose 65 - 99 mg/dL 328(H) 294(H) 363(H)  BUN 6 - 24 mg/dL 8 5(L) 7  Creatinine 0.76 - 1.27 mg/dL 0.73(L) 0.67(L) 0.87  Sodium 134 - 144 mmol/L 136 138 143  Potassium 3.5 - 5.2 mmol/L 4.1 4.5 3.7  Chloride 96 - 106 mmol/L 92(L) 99 103  CO2 20 - 29 mmol/L 18(L) 24 27  Calcium 8.7 - 10.2 mg/dL 9.0 9.0 8.8(L)  Total Protein 6.0 - 8.5 g/dL 7.1 7.2 7.5  Total Bilirubin 0.0 - 1.2 mg/dL 1.3(H) 0.6 0.7  Alkaline Phos 44 - 121 IU/L 102 88 116  AST 0 - 40 IU/L 24 21 57(H)  ALT 0 - 44 IU/L 20 22 68(H)   Lipid Panel     Component Value Date/Time   CHOL 224 (H) 09/29/2020 1340   TRIG 206 (H) 09/29/2020 1340   HDL 43 09/29/2020 1340   CHOLHDL 5.2 (H) 09/29/2020 1340   CHOLHDL 4.4 08/12/2019 1845   VLDL UNABLE TO CALCULATE IF TRIGLYCERIDE OVER 400 mg/dL 08/12/2019 1845   LDLCALC 144 (H) 09/29/2020 1340   LDLDIRECT 75.0 08/12/2019 1845    CBC    Component Value Date/Time   WBC 6.4 09/29/2020 1340   WBC 5.5 08/12/2019 1845   RBC 4.22 09/29/2020 1340   RBC 4.85 08/12/2019 1845   HGB 13.9 09/29/2020 1340   HCT 40.0 09/29/2020 1340   PLT 202 09/29/2020 1340   MCV 95 09/29/2020 1340   MCH 32.9 09/29/2020 1340   MCH 32.4 08/12/2019 1845   MCHC 34.8 09/29/2020 1340   MCHC 33.4 08/12/2019 1845   RDW 12.3 09/29/2020 1340   LYMPHSABS 2.8 03/07/2018 0016   MONOABS 0.4 03/07/2018 0016   EOSABS 0.2 03/07/2018 0016   BASOSABS 0.0 03/07/2018 0016   Depression screen PHQ 2/9 11/09/2021 05/10/2021 09/29/2020  Decreased Interest _0 Down, Depressed, Hopeless _1 PHQ - 2 Score _2 Altered sleeping _3 Tired, decreased energy _4 Change in appetite _5 Feeling bad or failure about yourself  _6 Trouble concentrating _7 Moving slowly or fidgety/restless 2 0 1  Suicidal thoughts 0 0 0  PHQ-9 Score _8 Some recent data might be hidden    ASSESSMENT AND PLAN:  1. 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  He will continue metformin.  We will check chemistry today to make sure his creatinine and GFR are still okay.  Recommend restarting Lantus insulin 10 units at bedtime.  Discussed health risks associated with uncontrolled diabetes.  He already has complications from diabetes including peripheral neuropathy and microalbumin.  Encouraged him to eat as healthy as he can. Refill given on Lyrica. Prescription sent for diabetic testing supplies.  Encouraged him to check blood sugars at least once a day before breakfast with goal being 90-130.  Follow-up with clinical pharmacist in 2 weeks for recheck. - POCT glucose (manual entry) - POCT glycosylated hemoglobin (Hb A1C) - CBC - Comprehensive metabolic panel - Lipid panel - Accu-Chek Softclix Lancets lancets; Use to check blood sugar three times daily.  Dispense: 100 each; Refill: 2 - Blood Glucose Monitoring Suppl (ACCU-CHEK GUIDE ME) w/Device KIT; Check blood sugar three times daily.  Dispense: 1 kit; Refill: 0 - glucose blood (  GUIDE) test strip; Use to check blood sugar three times daily.  Dispense: 100 each; Refill: 2 °- insulin glargine (LANTUS SOLOSTAR) 100 UNIT/ML Solostar Pen; Inject 10 Units into the skin daily.  Dispense: 15 mL; Refill: 3 °- metFORMIN (GLUCOPHAGE) 1000 MG tablet; Take 1 tablet (1,000 mg total) by mouth 2 (two) times daily with a meal.  Dispense: 60 tablet; Refill: 3 °- pregabalin (LYRICA) 50 MG capsule; Take 1 capsule by mouth twice a day as directed by physician.  Dispense: 60 capsule; Refill: 3 °- Ambulatory referral to Ophthalmology ° °2. Essential hypertension °Blood  pressure today is not bad considering he has been off Cozaar for some time.  I want to see what his creatinine looks like.  If it is okay we will start him on 25 of Cozaar taking half a pill a day more so for the microalbuminuria. ° °3. Mixed hyperlipidemia °- atorvastatin (LIPITOR) 10 MG tablet; TAKE 1 TABLET (10 MG TOTAL) BY MOUTH DAILY.  Dispense: 30 tablet; Refill: 1 ° °4. Moderate major depression (HCC) °Followed at [facility]. ° °5. Microalbuminuria °See #2 above.  Discussed importance of good diabetes control to prevent progression of this. ° °6. Need for immunization against influenza ° °- Flu Vaccine QUAD 6mo+IM (Fluarix, Fluzone & Alfiuria Quad PF) ° °7. Need for Streptococcus pneumoniae vaccination °- PNEUMOCOCCAL CONJUGATE VACCINE 15-VALENT ° ° °Patient was given the opportunity to ask questions.  Patient verbalized understanding of the plan and was able to repeat key elements of the plan.  ° °Orders Placed This Encounter  °Procedures  ° Flu Vaccine QUAD 6mo+IM (Fluarix, Fluzone & Alfiuria Quad PF)  ° PNEUMOCOCCAL CONJUGATE VACCINE 15-VALENT  ° CBC  ° Comprehensive metabolic panel  ° Lipid panel  ° Ambulatory referral to Ophthalmology  ° POCT glucose (manual entry)  ° POCT glycosylated hemoglobin (Hb A1C)  ° ° ° °Requested Prescriptions  ° °Signed Prescriptions Disp Refills  ° Accu-Chek Softclix Lancets lancets 100 each 2  °  Sig: Use to check blood sugar three times daily.  ° atorvastatin (LIPITOR) 10 MG tablet 30 tablet 1  °  Sig: TAKE 1 TABLET (10 MG TOTAL) BY MOUTH DAILY.  ° Blood Glucose Monitoring Suppl (ACCU-CHEK GUIDE ME) w/Device KIT 1 kit 0  °  Sig: Check blood sugar three times daily.  ° glucose blood (ACCU-CHEK GUIDE) test strip 100 each 2  °  Sig: Use to check blood sugar three times daily.  ° insulin glargine (LANTUS SOLOSTAR) 100 UNIT/ML Solostar Pen 15 mL 3  °  Sig: Inject 10 Units into the skin daily.  ° metFORMIN (GLUCOPHAGE) 1000 MG tablet 60 tablet 3  °  Sig: Take 1 tablet (1,000 mg  total) by mouth 2 (two) times daily with a meal.  ° pregabalin (LYRICA) 50 MG capsule 60 capsule 3  °  Sig: Take 1 capsule by mouth twice a day as directed by physician.  ° ° °Return in about 3 months (around 02/07/2022) for Appt with Luke in 2 wks for BS check. ° ° , MD, FACP ° °

## 2021-11-10 ENCOUNTER — Emergency Department (HOSPITAL_COMMUNITY)
Admission: EM | Admit: 2021-11-10 | Discharge: 2021-11-10 | Disposition: A | Discharge status: Home / Self Care | Payer: Medicaid Other | Attending: Emergency Medicine | Admitting: Emergency Medicine

## 2021-11-10 ENCOUNTER — Encounter (HOSPITAL_COMMUNITY): Payer: Self-pay

## 2021-11-10 ENCOUNTER — Telehealth: Payer: Self-pay | Admitting: Internal Medicine

## 2021-11-10 ENCOUNTER — Other Ambulatory Visit: Payer: Self-pay

## 2021-11-10 DIAGNOSIS — E1165 Type 2 diabetes mellitus with hyperglycemia: Secondary | ICD-10-CM | POA: Insufficient documentation

## 2021-11-10 DIAGNOSIS — I10 Essential (primary) hypertension: Secondary | ICD-10-CM | POA: Diagnosis not present

## 2021-11-10 DIAGNOSIS — F17290 Nicotine dependence, other tobacco product, uncomplicated: Secondary | ICD-10-CM | POA: Insufficient documentation

## 2021-11-10 DIAGNOSIS — R739 Hyperglycemia, unspecified: Secondary | ICD-10-CM

## 2021-11-10 DIAGNOSIS — Z7984 Long term (current) use of oral hypoglycemic drugs: Secondary | ICD-10-CM | POA: Diagnosis not present

## 2021-11-10 DIAGNOSIS — Z794 Long term (current) use of insulin: Secondary | ICD-10-CM | POA: Diagnosis not present

## 2021-11-10 LAB — CBC
Hematocrit: 44.7 % (ref 37.5–51.0)
Hemoglobin: 15.8 g/dL (ref 13.0–17.7)
MCH: 33.2 pg — ABNORMAL HIGH (ref 26.6–33.0)
MCHC: 35.3 g/dL (ref 31.5–35.7)
MCV: 94 fL (ref 79–97)
Platelets: 213 10*3/uL (ref 150–450)
RBC: 4.76 x10E6/uL (ref 4.14–5.80)
RDW: 12 % (ref 11.6–15.4)
WBC: 10.6 10*3/uL (ref 3.4–10.8)

## 2021-11-10 LAB — CBC WITH DIFFERENTIAL/PLATELET
Abs Immature Granulocytes: 0.04 10*3/uL (ref 0.00–0.07)
Basophils Absolute: 0.1 10*3/uL (ref 0.0–0.1)
Basophils Relative: 1 %
Eosinophils Absolute: 0.2 10*3/uL (ref 0.0–0.5)
Eosinophils Relative: 2 %
HCT: 44.9 % (ref 39.0–52.0)
Hemoglobin: 15.6 g/dL (ref 13.0–17.0)
Immature Granulocytes: 0 %
Lymphocytes Relative: 17 %
Lymphs Abs: 1.8 10*3/uL (ref 0.7–4.0)
MCH: 31.9 pg (ref 26.0–34.0)
MCHC: 34.7 g/dL (ref 30.0–36.0)
MCV: 91.8 fL (ref 80.0–100.0)
Monocytes Absolute: 0.8 10*3/uL (ref 0.1–1.0)
Monocytes Relative: 7 %
Neutro Abs: 7.5 10*3/uL (ref 1.7–7.7)
Neutrophils Relative %: 73 %
Platelets: 195 10*3/uL (ref 150–400)
RBC: 4.89 MIL/uL (ref 4.22–5.81)
RDW: 12.2 % (ref 11.5–15.5)
WBC: 10.4 10*3/uL (ref 4.0–10.5)
nRBC: 0 % (ref 0.0–0.2)

## 2021-11-10 LAB — COMPREHENSIVE METABOLIC PANEL
ALT: 19 IU/L (ref 0–44)
ALT: 19 U/L (ref 0–44)
AST: 16 IU/L (ref 0–40)
AST: 15 U/L (ref 15–41)
Albumin/Globulin Ratio: 1.3 (ref 1.2–2.2)
Albumin: 4.1 g/dL (ref 3.8–4.9)
Albumin: 3.5 g/dL (ref 3.5–5.0)
Alkaline Phosphatase: 106 IU/L (ref 44–121)
Alkaline Phosphatase: 84 U/L (ref 38–126)
Anion gap: 12 (ref 5–15)
BUN/Creatinine Ratio: 14 (ref 9–20)
BUN: 11 mg/dL (ref 6–24)
BUN: 9 mg/dL (ref 6–20)
Bilirubin Total: 0.9 mg/dL (ref 0.0–1.2)
CO2: 15 mmol/L — ABNORMAL LOW (ref 20–29)
CO2: 22 mmol/L (ref 22–32)
Calcium: 9.4 mg/dL (ref 8.7–10.2)
Calcium: 9.1 mg/dL (ref 8.9–10.3)
Chloride: 90 mmol/L — ABNORMAL LOW (ref 96–106)
Chloride: 98 mmol/L (ref 98–111)
Creatinine, Ser: 0.79 mg/dL (ref 0.76–1.27)
Creatinine, Ser: 0.74 mg/dL (ref 0.61–1.24)
GFR, Estimated: >60 mL/min (ref >60)
Globulin, Total: 3.2 g/dL (ref 1.5–4.5)
Glucose, Bld: 375 mg/dL — ABNORMAL HIGH (ref 70–99)
Glucose: 528 mg/dL (ref 70–99)
Potassium: 4 mmol/L (ref 3.5–5.2)
Potassium: 3.6 mmol/L (ref 3.5–5.1)
Sodium: 128 mmol/L — ABNORMAL LOW (ref 134–144)
Sodium: 132 mmol/L — ABNORMAL LOW (ref 135–145)
Total Bilirubin: 1.3 mg/dL — ABNORMAL HIGH (ref 0.3–1.2)
Total Protein: 7.3 g/dL (ref 6.0–8.5)
Total Protein: 7.4 g/dL (ref 6.5–8.1)
eGFR: 107 mL/min/{1.73_m2} (ref >59)

## 2021-11-10 LAB — LIPID PANEL
Chol/HDL Ratio: 5.5 ratio — ABNORMAL HIGH (ref 0.0–5.0)
Cholesterol, Total: 202 mg/dL — ABNORMAL HIGH (ref 100–199)
HDL: 37 mg/dL — ABNORMAL LOW (ref >39)
LDL Chol Calc (NIH): 107 mg/dL — ABNORMAL HIGH (ref 0–99)
Triglycerides: 339 mg/dL — ABNORMAL HIGH (ref 0–149)
VLDL Cholesterol Cal: 58 mg/dL — ABNORMAL HIGH (ref 5–40)

## 2021-11-10 LAB — BETA-HYDROXYBUTYRIC ACID: Beta-Hydroxybutyric Acid: 1.29 mmol/L — ABNORMAL HIGH (ref 0.05–0.27)

## 2021-11-10 LAB — CBG MONITORING, ED: Glucose-Capillary: 361 mg/dL — ABNORMAL HIGH (ref 70–99)

## 2021-11-10 NOTE — ED Triage Notes (Signed)
Pt arrives POV for eval of hyperglycemia and weakness. [facility] and found to have elevated CBG. Pt is poor historian and reports poorly controlled diabetes.

## 2021-11-10 NOTE — ED Provider Notes (Signed)
[facility] EMERGENCY DEPARTMENT Provider Note   CSN: [id number] Arrival date & time: 11/10/21  1514     History Chief Complaint  Patient presents with   Abnormal Lab    EZIO WIECK is a 55 y.o. male.  55 yo M with a chief complaints of hyperglycemia.  Was seen by his doctor yesterday and found to have lab work concerning for diabetic ketoacidosis.  He tells me that he had stopped taking his medicines about 2 months ago because he was feeling well and did not feel he needed anymore.  He did start taking his medicine after seeing his doctor yesterday.  He denies any confusion denies vomiting denies difficulty breathing.  Denies recent illness.  The history is provided by the patient and the spouse.  Abnormal Lab Illness Severity:  Moderate Onset quality:  Gradual Duration:  2 days Timing:  Constant Progression:  Unchanged Chronicity:  New Associated symptoms: no abdominal pain, no chest pain, no congestion, no diarrhea, no fever, no headaches, no myalgias, no rash, no shortness of breath and no vomiting       Past Medical History:  Diagnosis Date   Diabetes mellitus without complication ([place])    ETOH abuse    Hypercholesteremia    Hypertension    Suicidal ideation     Patient Active Problem List   Diagnosis Date Noted   Psychosis ([place]) 08/12/2019   Microalbuminuria 01/30/2019   Alcohol use disorder, severe, in early remission ([place]) 12/05/2018   Chronic pain of left knee 05/08/2018   Primary localized osteoarthritis of left knee 12/27/2017   Cervicalgia 12/27/2017   Anxiety 12/27/2017   History of panic attacks 12/27/2017   Chronic right shoulder pain 12/27/2017   Essential hypertension 10/04/2017   Alcohol abuse with alcohol-induced mood disorder ([place]) 09/06/2017   Hypercholesteremia 07/04/2017   Type 2 diabetes mellitus without complication, without long-term current use of insulin ([place]) 04/05/2017    Past Surgical History:  Procedure  Laterality Date   FOOT SURGERY     KNEE SURGERY         History reviewed. No pertinent family history.  Social History   Tobacco Use   Smoking status: Never   Smokeless tobacco: Current    Types: Snuff   Tobacco comments:    daily since 55 years old  Substance Use Topics   Alcohol use: Yes    Alcohol/week: 0.0 standard drinks    Comment: beer - daily 1/2 a case    Drug use: No    Home Medications Prior to Admission medications   Medication Sig Start Date End Date Taking? Authorizing Provider  Accu-Chek Softclix Lancets lancets Use to check blood sugar three times daily. 11/09/21   Ladell Pier, MD  atorvastatin (LIPITOR) 10 MG tablet TAKE 1 TABLET (10 MG TOTAL) BY MOUTH DAILY. 11/09/21 11/09/22  Ladell Pier, MD  Blood Glucose Monitoring Suppl (ACCU-CHEK GUIDE) w/Device KIT Check blood sugar three times daily 11/09/21   Ladell Pier, MD  glucose blood (ACCU-CHEK GUIDE) test strip Use to check blood sugar three times daily. 11/09/21   Ladell Pier, MD  insulin glargine (LANTUS SOLOSTAR) 100 UNIT/ML Solostar Pen Inject 10 Units into the skin daily. 11/09/21   Ladell Pier, MD  losartan (COZAAR) 50 MG tablet TAKE 1 TABLET (50 MG TOTAL) BY MOUTH DAILY. Patient not taking: Reported on 11/09/2021 05/10/21 05/10/22  Argentina Donovan, PA-C  metFORMIN (GLUCOPHAGE) 1000 MG tablet Take 1 tablet (1,000 mg  total) by mouth 2 (two) times daily with a meal. 11/09/21   Ladell Pier, MD  pregabalin (LYRICA) 50 MG capsule Take 1 capsule by mouth twice a day as directed by physician. 11/09/21   Ladell Pier, MD  glimepiride (AMARYL) 4 MG tablet Take 1 tablet (4 mg total) by mouth 2 (two) times daily. 09/29/20 11/01/20  Camillia Herter, NP    Allergies    Lisinopril and Buspar [buspirone]  Review of Systems   Review of Systems  Constitutional:  Negative for chills and fever.  HENT:  Negative for congestion and facial swelling.   Eyes:  Negative for  discharge and visual disturbance.  Respiratory:  Negative for shortness of breath.   Cardiovascular:  Negative for chest pain and palpitations.  Gastrointestinal:  Negative for abdominal pain, diarrhea and vomiting.  Musculoskeletal:  Negative for arthralgias and myalgias.  Skin:  Negative for color change and rash.  Neurological:  Negative for tremors, syncope and headaches.  Psychiatric/Behavioral:  Negative for confusion and dysphoric mood.    Physical Exam Updated Vital Signs BP (!) 137/91 (BP Location: Right Arm)    Pulse (!) 102    Temp 98 F (36.7 C) (Oral)    Resp 16    Ht 5' 6.5" (1.689 m)    Wt 85 kg    SpO2 97%    BMI 29.79 kg/m   Physical Exam Vitals and nursing note reviewed.  Constitutional:      Appearance: He is well-developed.  HENT:     Head: Normocephalic and atraumatic.  Eyes:     Pupils: Pupils are equal, round, and reactive to light.  Neck:     Vascular: No JVD.  Cardiovascular:     Rate and Rhythm: Normal rate and regular rhythm.     Heart sounds: No murmur heard.   No friction rub. No gallop.  Pulmonary:     Effort: No respiratory distress.     Breath sounds: No wheezing.  Abdominal:     General: There is no distension.     Tenderness: There is no abdominal tenderness. There is no guarding or rebound.  Musculoskeletal:        General: Normal range of motion.     Cervical back: Normal range of motion and neck supple.  Skin:    Coloration: Skin is not pale.     Findings: No rash.  Neurological:     Mental Status: He is alert and oriented to person, place, and time.  Psychiatric:        Behavior: Behavior normal.    ED Results / Procedures / Treatments   Labs (all labs ordered are listed, but only abnormal results are displayed) Labs Reviewed  COMPREHENSIVE METABOLIC PANEL - Abnormal; Notable for the following components:      Result Value   Sodium 132 (*)    Glucose, Bld 375 (*)    Total Bilirubin 1.3 (*)    All other components within  normal limits  BETA-HYDROXYBUTYRIC ACID - Abnormal; Notable for the following components:   Beta-Hydroxybutyric Acid 1.29 (*)    All other components within normal limits  CBG MONITORING, ED - Abnormal; Notable for the following components:   Glucose-Capillary 361 (*)    All other components within normal limits  CBC WITH DIFFERENTIAL/PLATELET  URINALYSIS, ROUTINE W REFLEX MICROSCOPIC    EKG None  Radiology No results found.  Procedures Procedures   Medications Ordered in ED Medications - No data to display  ED  Course  I have reviewed the triage vital signs and the nursing notes.  Pertinent labs & imaging results that were available during my care of the patient were reviewed by me and considered in my medical decision making (see chart for details).    MDM Rules/Calculators/A&P                         55 yo M with a chief complaints of hyperglycemia.  The patient had seen his family physician yesterday and had lab work that was concerning for diabetic ketoacidosis he had a bicarb of 67 and a anion gap and hyperglycemia into the 500s.  Likely today he does not appear to be in DKA.  Bicarb 22 blood sugar in the low 300s.  No anion gap.  This may have improved with his resumption of his home medications.  Discussed with him about the importance of continuing to take his home diabetes medicines.  We will have him call his family doctor tomorrow if able or otherwise the next working day to see if they want to change his medications.  4:42 PM:  I have discussed the diagnosis/risks/treatment options with the patient and family and believe the pt to be eligible for discharge home to follow-up with PCP. We also discussed returning to the ED immediately if new or worsening sx occur. We discussed the sx which are most concerning (e.g., sudden worsening pain, fever, inability to tolerate by mouth) that necessitate immediate return. Medications administered to the patient during their visit  and any new prescriptions provided to the patient are listed below.  Medications given during this visit Medications - No data to display   The patient appears reasonably screen and/or stabilized for discharge and I doubt any other medical condition or other [facility] requiring further screening, evaluation, or treatment in the ED at this time prior to discharge.      Final Clinical Impression(s) / ED Diagnoses Final diagnoses:  Hyperglycemia    Rx / DC Orders ED Discharge Orders     None        Deno Etienne, DO 11/10/21 1642

## 2021-11-10 NOTE — Telephone Encounter (Signed)
Noted  

## 2021-11-10 NOTE — Telephone Encounter (Signed)
Phone call placed to patient today.  Patient advised that his blood test came back showing blood sugar significantly elevated and findings to suggest that he has diabetic ketoacidosis.  I asked whether he started the Lantus insulin that was refilled on his visit yesterday afternoon and has started taking.  Patient states he has not picked up any of the medicines that I sent to the pharmacy yesterday as yet. Advised patient to be seen in the emergency room as labs suggest DKA.   Advised to hold off on taking the metformin until he has been seen in the ER and given fluids and insulin. Patient also advised that his cholesterol level is not at goal and he should take the atorvastatin. Patient states he will have someone take him to the emergency room.

## 2021-11-10 NOTE — Discharge Instructions (Signed)
Call your doctor tomorrow and let them know about your visit here and see if they want to change any of your medications.  Until you are able to talk with them take your medication as prescribed.

## 2021-11-10 NOTE — ED Provider Notes (Signed)
Emergency Medicine Provider Triage Evaluation Note  Adrian Schultz , a 55 y.o. male  was evaluated in triage.  Pt complains of abnormal labs. Patient had recent BG reading of 528 and was sent by his PCP. Patient denies past history of DKA.   Review of Systems  Positive: Weakness Negative: CP, SOB, abdominal pain, N/V/D  Physical Exam  BP (!) 137/91 (BP Location: Right Arm)    Pulse (!) 102    Temp 98 F (36.7 C) (Oral)    Resp 16    Ht 5' 6.5" (1.689 m)    Wt 85 kg    SpO2 97%    BMI 29.79 kg/m  Gen:   Awake, no distress   Resp:  Normal effort  MSK:   Moves extremities without difficulty  Other:    Medical Decision Making  Medically screening exam initiated at 3:28 PM.  Appropriate orders placed.  Adrian Schultz was informed that the remainder of the evaluation will be completed by another provider, this initial triage assessment does not replace that evaluation, and the importance of remaining in the ED until their evaluation is complete.     Al Decant, PA-C 11/10/21 1529    Horton, Clabe Seal, DO 11/16/21 1147

## 2021-11-10 NOTE — Telephone Encounter (Signed)
Copied from CRM [id number]. Topic: General - Other >> Nov 10, 2021  9:12 AM Gaetana Michaelis A wrote: Reason for CRM: Vance Gather with [facility] has called to provide a critical report of patient's glucose   Resulted at 528   Labs were collected on 11/09/21 at 16:50 and resulted this morning   Please contact further if needed

## 2021-11-14 NOTE — Telephone Encounter (Signed)
Pt calling asking if he can start taking metFORMIN (GLUCOPHAGE) again, requesting a call back at [phone]

## 2021-11-15 NOTE — Telephone Encounter (Signed)
Contacted pt and made aware that he suppose to be taking the Metformin based on Dr. Laural Benes last ov note. Pt states he understands and doesn't have any questions or concerns

## 2021-11-17 ENCOUNTER — Other Ambulatory Visit: Payer: Self-pay

## 2021-12-12 ENCOUNTER — Ambulatory Visit: Payer: Medicaid Other | Admitting: Pharmacist

## 2021-12-26 ENCOUNTER — Ambulatory Visit: Payer: Medicaid Other | Admitting: Pharmacist

## 2022-02-08 ENCOUNTER — Ambulatory Visit: Payer: Medicaid Other | Admitting: Internal Medicine

## 2022-02-20 ENCOUNTER — Other Ambulatory Visit: Payer: Self-pay

## 2022-02-20 ENCOUNTER — Ambulatory Visit: Payer: Medicaid Other | Attending: Internal Medicine | Admitting: Internal Medicine

## 2022-02-20 ENCOUNTER — Other Ambulatory Visit: Payer: Self-pay | Admitting: Pharmacist

## 2022-02-20 ENCOUNTER — Encounter: Payer: Self-pay | Admitting: Internal Medicine

## 2022-02-20 VITALS — BP 108/78 | HR 95 | Resp 16 | Wt 176.2 lb

## 2022-02-20 DIAGNOSIS — E1140 Type 2 diabetes mellitus with diabetic neuropathy, unspecified: Secondary | ICD-10-CM | POA: Insufficient documentation

## 2022-02-20 DIAGNOSIS — M79606 Pain in leg, unspecified: Secondary | ICD-10-CM | POA: Diagnosis not present

## 2022-02-20 DIAGNOSIS — Z1211 Encounter for screening for malignant neoplasm of colon: Secondary | ICD-10-CM | POA: Diagnosis not present

## 2022-02-20 DIAGNOSIS — R739 Hyperglycemia, unspecified: Secondary | ICD-10-CM

## 2022-02-20 DIAGNOSIS — M549 Dorsalgia, unspecified: Secondary | ICD-10-CM | POA: Diagnosis not present

## 2022-02-20 DIAGNOSIS — E782 Mixed hyperlipidemia: Secondary | ICD-10-CM | POA: Insufficient documentation

## 2022-02-20 DIAGNOSIS — I10 Essential (primary) hypertension: Secondary | ICD-10-CM | POA: Diagnosis not present

## 2022-02-20 DIAGNOSIS — R634 Abnormal weight loss: Secondary | ICD-10-CM | POA: Insufficient documentation

## 2022-02-20 DIAGNOSIS — Z79899 Other long term (current) drug therapy: Secondary | ICD-10-CM | POA: Diagnosis not present

## 2022-02-20 DIAGNOSIS — R809 Proteinuria, unspecified: Secondary | ICD-10-CM | POA: Insufficient documentation

## 2022-02-20 DIAGNOSIS — M25511 Pain in right shoulder: Secondary | ICD-10-CM | POA: Diagnosis not present

## 2022-02-20 DIAGNOSIS — Z794 Long term (current) use of insulin: Secondary | ICD-10-CM | POA: Diagnosis not present

## 2022-02-20 DIAGNOSIS — Z5986 Financial insecurity: Secondary | ICD-10-CM | POA: Insufficient documentation

## 2022-02-20 LAB — POCT GLYCOSYLATED HEMOGLOBIN (HGB A1C): HbA1c, POC (controlled diabetic range): 9.6 % — AB (ref 0.0–7.0)

## 2022-02-20 LAB — POCT URINALYSIS DIP (CLINITEK)
Glucose, UA: >=1000 mg/dL — AB
Leukocytes, UA: NEGATIVE
Nitrite, UA: NEGATIVE
POC PROTEIN,UA: 30 — AB
Spec Grav, UA: 1.02 (ref 1.010–1.025)
Urobilinogen, UA: 1 E.U./dL
pH, UA: 5.5 (ref 5.0–8.0)

## 2022-02-20 LAB — GLUCOSE, POCT (MANUAL RESULT ENTRY)
POC Glucose: 405 mg/dl — AB (ref 70–99)
POC Glucose: 435 mg/dl — AB (ref 70–99)

## 2022-02-20 MED ORDER — INSULIN ASPART 100 UNIT/ML IJ SOLN: 4.0000 [IU] | Freq: Once | INTRAMUSCULAR | Status: DC

## 2022-02-20 MED ORDER — NOVOLOG FLEXPEN 100 UNIT/ML ~~LOC~~ SOPN
3.0000 [IU] | PEN_INJECTOR | Freq: Three times a day (TID) | SUBCUTANEOUS | 11 refills | Status: DC
  Filled 2022-02-20: qty 15, 90d supply, fill #0
  Filled 2022-06-22: qty 15, 90d supply, fill #1

## 2022-02-20 MED ORDER — INSULIN ASPART 100 UNIT/ML IJ SOLN: 6.0000 [IU] | Freq: Once | INTRAMUSCULAR | Status: DC

## 2022-02-20 MED ORDER — ACCU-CHEK GUIDE W/DEVICE KIT
PACK | 0 refills | Status: DC
  Filled 2022-02-20: qty 1, 30d supply, fill #0

## 2022-02-20 MED ORDER — ATORVASTATIN CALCIUM 10 MG PO TABS
ORAL_TABLET | Freq: Every day | ORAL | 4 refills | Status: DC
  Filled 2022-02-20: qty 30, 30d supply, fill #0
  Filled 2022-06-22: qty 30, 30d supply, fill #1

## 2022-02-20 MED ORDER — ACCU-CHEK SOFTCLIX LANCETS MISC
3 refills | Status: AC
  Filled 2022-02-20: qty 100, 33d supply, fill #0
  Filled 2022-06-22: qty 100, 33d supply, fill #1

## 2022-02-20 MED ORDER — PREGABALIN 50 MG PO CAPS
ORAL_CAPSULE | ORAL | 3 refills | Status: DC
  Filled 2022-02-20: qty 60, 30d supply, fill #0

## 2022-02-20 MED ORDER — ACCU-CHEK GUIDE VI STRP
ORAL_STRIP | 3 refills | Status: AC
  Filled 2022-02-20: qty 100, 33d supply, fill #0
  Filled 2022-06-22: qty 100, 33d supply, fill #1

## 2022-02-20 MED ORDER — LANTUS SOLOSTAR 100 UNIT/ML ~~LOC~~ SOPN
10.0000 [IU] | PEN_INJECTOR | Freq: Every day | SUBCUTANEOUS | 3 refills | Status: AC
  Filled 2022-02-20: qty 15, 90d supply, fill #0
  Filled 2022-06-22: qty 15, 90d supply, fill #1

## 2022-02-20 NOTE — Progress Notes (Signed)
? ? ?Patient ID: Adrian Schultz, male    DOB: 1966/03/21  MRN: [id number] ? ?CC: Diabetes, Back Pain, and Leg Pain ? ? ?Subjective: ?Adrian Schultz is a 56 y.o. male who presents for chronic ds management ?His concerns today include:  ?Pt with hx of HTN, HL, DM with neuropathy, ETOH abuse, anxiety, chronic RT shoulder pain.  ? ?DM: ?Lab Results  ?Component Value Date  ? HGBA1C 9.6 (A) 02/20/2022  ?BS 405 ?Patient last seen in December.  A1c at that time was 11.1.  Restarted on metformin and Lantus insulin 10 units daily.  Blood test came back the next day showing that he was in DKA and patient had not picked up the Lantus insulin.  Patient sent to the emergency room but was not admitted. ?-Since last visit, he reports that he was taking the metformin but ran out 2 days ago.  Ran out of Lantus several weeks ago.  Has refills on the prescriptions but could not afford his $4 co-pay ?Referred for eye exam on last visit.  Missed appt.  Memory poor ?Wgh down 11 lbs since last visit 10/2021.  He is concerned about this. ?Endorses frequent urination.  Drinks water, V8 Cranberry juice ?No blurred vision  ?Gets "stinging pains" in feet.  Never did fill the prescription for Lyrica which she is supposed to be taking to help with diabetic neuropathy symptoms.  Did not get Lyrica or Lipitor for the same reason for not getting his diabetes medications ?No blood in stools or urine ?No fever or night sweats ?No palpitations or SOB ? ?On last visit, I had told him to hold off on filling Cozaar until I saw with his kidney function looked like.  He does have mild microalbumin urea. ? ? ?C/o aches and pain all over body and back for 1 mth or longer.  Also pain in the hips.  He ambulates with a cane.  He has not had any falls. ? ? ?Patient Active Problem List  ? Diagnosis Date Noted  ? Psychosis (HCC) 08/12/2019  ? Microalbuminuria 01/30/2019  ? Alcohol use disorder, severe, in early remission (HCC) 12/05/2018  ? Chronic pain of left knee  05/08/2018  ? Primary localized osteoarthritis of left knee 12/27/2017  ? Cervicalgia 12/27/2017  ? Anxiety 12/27/2017  ? History of panic attacks 12/27/2017  ? Chronic right shoulder pain 12/27/2017  ? Essential hypertension 10/04/2017  ? Alcohol abuse with alcohol-induced mood disorder (HCC) 09/06/2017  ? Hypercholesteremia 07/04/2017  ? Type 2 diabetes mellitus without complication, without long-term current use of insulin (HCC) 04/05/2017  ?  ? ?Current Outpatient Medications on File Prior to Visit  ?Medication Sig Dispense Refill  ? [DISCONTINUED] glimepiride (AMARYL) 4 MG tablet Take 1 tablet (4 mg total) by mouth 2 (two) times daily. 60 tablet 2  ? ?No current facility-administered medications on file prior to visit.  ? ? ?Allergies  ?Allergen Reactions  ? Lisinopril Cough  ?  Tolerates Losartan (home med)  ? Buspar [Buspirone]   ?  Cause nause  ? ? ?Social History  ? ?Socioeconomic History  ? Marital status: Divorced  ?  Spouse name: Not on file  ? Number of children: Not on file  ? Years of education: Not on file  ? Highest education level: Not on file  ?Occupational History  ? Occupation: Unemployeed  ?Tobacco Use  ? Smoking status: Never  ? Smokeless tobacco: Current  ?  Types: Snuff  ? Tobacco comments:  ?  daily  since 56 years old  ?Substance and Sexual Activity  ? Alcohol use: Yes  ?  Alcohol/week: 0.0 standard drinks  ?  Comment: beer - daily 1/2 a case   ? Drug use: No  ? Sexual activity: Not Currently  ?Other Topics Concern  ? Not on file  ?Social History Narrative  ? Live in shelter - [facility]   ? ?Social Determinants of Health  ? ?Financial Resource Strain: Not on file  ?Food Insecurity: Not on file  ?Transportation Needs: Not on file  ?Physical Activity: Not on file  ?Stress: Not on file  ?Social Connections: Not on file  ?Intimate Partner Violence: Not on file  ? ? ?No family history on file. ? ?Past Surgical History:  ?Procedure Laterality Date  ? FOOT SURGERY    ? KNEE SURGERY     ? ? ?ROS: ?Review of Systems ?Negative except as stated above ? ?PHYSICAL EXAM: ?BP 108/78   Pulse 95   Resp 16   Wt 176 lb 3.2 oz (79.9 kg)   SpO2 95%   BMI 28.01 kg/m?   ?Wt Readings from Last 3 Encounters:  ?02/20/22 176 lb 3.2 oz (79.9 kg)  ?11/10/21 187 lb 6.3 oz (85 kg)  ?11/09/21 188 lb 9.6 oz (85.5 kg)  ? ? ?Physical Exam ? ?General appearance -middle-aged older Caucasian male in NAD. ?Mental status -patient forgetful.  He answers most questions appropriately.  He is very tangential in his thoughts and response to questions. ?Mouth -lips are dry.  Tongue moist. ?Neck -no cervical lymphadenopathy.  No thyroid enlargement.  No thyroid nodules. ?Chest - clear to auscultation, no wheezes, rales or rhonchi, symmetric air entry ?Heart - normal rate, regular rhythm, normal S1, S2, no murmurs, rubs, clicks or gallops ?Abdomen - soft, nontender, nondistended, no masses or organomegaly ?Extremities -no lower extremity edema. ? ?Results for orders placed or performed in visit on 02/20/22  ?POCT glucose (manual entry)  ?Result Value Ref Range  ? POC Glucose 405 (A) 70 - 99 mg/dl  ?POCT glycosylated hemoglobin (Hb A1C)  ?Result Value Ref Range  ? Hemoglobin A1C    ? HbA1c POC (<> result, manual entry)    ? HbA1c, POC (prediabetic range)    ? HbA1c, POC (controlled diabetic range) 9.6 (A) 0.0 - 7.0 %  ?POCT URINALYSIS DIP (CLINITEK)  ?Result Value Ref Range  ? Color, UA yellow yellow  ? Clarity, UA cloudy (A) clear  ? Glucose, UA >=1,000 (A) negative mg/dL  ? Bilirubin, UA small (A) negative  ? Ketones, POC UA small (15) (A) negative mg/dL  ? Spec Grav, UA 1.020 1.010 - 1.025  ? Blood, UA trace-intact (A) negative  ? pH, UA 5.5 5.0 - 8.0  ? POC PROTEIN,UA =30 (A) negative, trace  ? Urobilinogen, UA 1.0 0.2 or 1.0 E.U./dL  ? Nitrite, UA Negative Negative  ? Leukocytes, UA Negative Negative  ? ? ? ? ?  Latest Ref Rng & Units 11/10/2021  ?  3:28 PM 11/09/2021  ?  4:50 PM 05/10/2021  ?  9:41 AM  ?CMP  ?Glucose 70 - 99  mg/dL 161375   096528   045328    ?BUN 6 - 20 mg/dL 9   11   8     ?Creatinine 0.61 - 1.24 mg/dL 4.090.74   8.110.79   9.140.73    ?Sodium 135 - 145 mmol/L 132   128   136    ?Potassium 3.5 - 5.1 mmol/L 3.6   4.0  4.1    ?Chloride 98 - 111 mmol/L 98   90   92    ?CO2 22 - 32 mmol/L 22   15   18     ?Calcium 8.9 - 10.3 mg/dL 9.1   9.4   9.0    ?Total Protein 6.5 - 8.1 g/dL 7.4   7.3   7.1    ?Total Bilirubin 0.3 - 1.2 mg/dL 1.3   0.9   1.3    ?Alkaline Phos 38 - 126 U/L 84   106   102    ?AST 15 - 41 U/L 15   16   24     ?ALT 0 - 44 U/L 19   19   20     ? ?Lipid Panel  ?   ?Component Value Date/Time  ? CHOL 202 (H) 11/09/2021 1650  ? TRIG 339 (H) 11/09/2021 1650  ? HDL 37 (L) 11/09/2021 1650  ? CHOLHDL 5.5 (H) 11/09/2021 1650  ? CHOLHDL 4.4 08/12/2019 1845  ? VLDL UNABLE TO CALCULATE IF TRIGLYCERIDE OVER 400 mg/dL 11/11/2021 11/11/2021  ? LDLCALC 107 (H) 11/09/2021 1650  ? LDLDIRECT 75.0 08/12/2019 1845  ? ? ?CBC ?   ?Component Value Date/Time  ? WBC 10.4 11/10/2021 1528  ? RBC 4.89 11/10/2021 1528  ? HGB 15.6 11/10/2021 1528  ? HGB 15.8 11/09/2021 1650  ? HCT 44.9 11/10/2021 1528  ? HCT 44.7 11/09/2021 1650  ? PLT 195 11/10/2021 1528  ? PLT 213 11/09/2021 1650  ? MCV 91.8 11/10/2021 1528  ? MCV 94 11/09/2021 1650  ? MCH 31.9 11/10/2021 1528  ? MCHC 34.7 11/10/2021 1528  ? RDW 12.2 11/10/2021 1528  ? RDW 12.0 11/09/2021 1650  ? LYMPHSABS 1.8 11/10/2021 1528  ? MONOABS 0.8 11/10/2021 1528  ? EOSABS 0.2 11/10/2021 1528  ? BASOSABS 0.1 11/10/2021 1528  ? ? ?ASSESSMENT AND PLAN: ?1. 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 ?Suspect his unexplained weight loss may be due to uncontrolled diabetes. ?He has small amount of ketones in the urine today.  We will check BMP today to see if he is in DKA.   ?We gave him 2 bottles of water to drink while here.  Given a total of 10 units of NovoLog insulin here in the clinic. ?-Stop metformin ?-Continue Lantus insulin 10 units daily.  Start NovoLog  3 units with meals. ?I spoke with our clinical pharmacist, patient should be able to get his medicines today without having to pay his co-pay. ?-Prescription sent for diabetic testing supplies.  He would have a co-pay if

## 2022-02-20 NOTE — Patient Instructions (Signed)
Continue Lantus insulin 10 units daily at bedtime. ?Stop Metformin. ?Start Novolog insulin 3 units with each meal. ?Check your blood sugars twice a day before breakfast and dinner.  Write down your numbers and bring with you on your next visit in 2 weeks ?

## 2022-02-21 ENCOUNTER — Other Ambulatory Visit: Payer: Self-pay

## 2022-02-21 ENCOUNTER — Encounter: Payer: Self-pay | Admitting: *Deleted

## 2022-02-21 ENCOUNTER — Telehealth: Payer: Self-pay | Admitting: Internal Medicine

## 2022-02-21 LAB — BASIC METABOLIC PANEL
BUN/Creatinine Ratio: 10 (ref 9–20)
BUN: 9 mg/dL (ref 6–24)
CO2: 21 mmol/L (ref 20–29)
Calcium: 9.3 mg/dL (ref 8.7–10.2)
Chloride: 93 mmol/L — ABNORMAL LOW (ref 96–106)
Creatinine, Ser: 0.91 mg/dL (ref 0.76–1.27)
Glucose: 405 mg/dL — ABNORMAL HIGH (ref 70–99)
Potassium: 3.8 mmol/L (ref 3.5–5.2)
Sodium: 135 mmol/L (ref 134–144)
eGFR: 101 mL/min/{1.73_m2} (ref >59)

## 2022-02-21 LAB — TSH+T4F+T3FREE
Free T4: 1.48 ng/dL (ref 0.82–1.77)
T3, Free: 2.5 pg/mL (ref 2.0–4.4)
TSH: 1.82 u[IU]/mL (ref 0.450–4.500)

## 2022-02-21 NOTE — Telephone Encounter (Signed)
This encounter was created in error - please disregard.

## 2022-02-21 NOTE — Telephone Encounter (Signed)
Pt returned call, states he could not wait on pharmacy yesterday for his insulin and he asked them to mail it to him. So he has not received it yet, he expects it today. I called pharmacy and they stated it did go out yesterday. ?

## 2022-02-21 NOTE — Telephone Encounter (Signed)
Phone call placed to patient this morning to go over lab results.  I left a message letting him know that I got his lab results and there was some acidosis.  I wanted to know whether he was able to pick up his insulins yesterday and has started taking them.  I request that he gives a call back to my medical assistant letting her know whether or not he got his insulin. ? ?Results for orders placed or performed in visit on 02/20/22  ?Basic Metabolic Panel  ?Result Value Ref Range  ? Glucose 405 (H) 70 - 99 mg/dL  ? BUN 9 6 - 24 mg/dL  ? Creatinine, Ser 0.91 0.76 - 1.27 mg/dL  ? eGFR 101 >59 mL/min/1.73  ? BUN/Creatinine Ratio 10 9 - 20  ? Sodium 135 134 - 144 mmol/L  ? Potassium 3.8 3.5 - 5.2 mmol/L  ? Chloride 93 (L) 96 - 106 mmol/L  ? CO2 21 20 - 29 mmol/L  ? Calcium 9.3 8.7 - 10.2 mg/dL  ?TSH+T4F+T3Free  ?Result Value Ref Range  ? TSH 1.820 0.450 - 4.500 uIU/mL  ? T3, Free WILL FOLLOW   ? Free T4 1.48 0.82 - 1.77 ng/dL  ?POCT glucose (manual entry)  ?Result Value Ref Range  ? POC Glucose 405 (A) 70 - 99 mg/dl  ?POCT glycosylated hemoglobin (Hb A1C)  ?Result Value Ref Range  ? Hemoglobin A1C    ? HbA1c POC (<> result, manual entry)    ? HbA1c, POC (prediabetic range)    ? HbA1c, POC (controlled diabetic range) 9.6 (A) 0.0 - 7.0 %  ?POCT URINALYSIS DIP (CLINITEK)  ?Result Value Ref Range  ? Color, UA yellow yellow  ? Clarity, UA cloudy (A) clear  ? Glucose, UA >=1,000 (A) negative mg/dL  ? Bilirubin, UA small (A) negative  ? Ketones, POC UA small (15) (A) negative mg/dL  ? Spec Grav, UA 1.020 1.010 - 1.025  ? Blood, UA trace-intact (A) negative  ? pH, UA 5.5 5.0 - 8.0  ? POC PROTEIN,UA =30 (A) negative, trace  ? Urobilinogen, UA 1.0 0.2 or 1.0 E.U./dL  ? Nitrite, UA Negative Negative  ? Leukocytes, UA Negative Negative  ?POCT glucose (manual entry)  ?Result Value Ref Range  ? POC Glucose 435 (A) 70 - 99 mg/dl  ? ? ?

## 2022-03-16 ENCOUNTER — Emergency Department (HOSPITAL_COMMUNITY)
Admission: EM | Admit: 2022-03-16 | Discharge: 2022-03-17 | Disposition: A | Discharge status: Home / Self Care | Payer: Medicaid Other | Attending: Emergency Medicine | Admitting: Emergency Medicine

## 2022-03-16 ENCOUNTER — Other Ambulatory Visit: Payer: Self-pay

## 2022-03-16 ENCOUNTER — Emergency Department (HOSPITAL_COMMUNITY): Payer: Medicaid Other

## 2022-03-16 ENCOUNTER — Encounter (HOSPITAL_COMMUNITY): Payer: Self-pay

## 2022-03-16 DIAGNOSIS — W01198A Fall on same level from slipping, tripping and stumbling with subsequent striking against other object, initial encounter: Secondary | ICD-10-CM | POA: Insufficient documentation

## 2022-03-16 DIAGNOSIS — Z5321 Procedure and treatment not carried out due to patient leaving prior to being seen by health care provider: Secondary | ICD-10-CM | POA: Diagnosis not present

## 2022-03-16 DIAGNOSIS — S0181XA Laceration without foreign body of other part of head, initial encounter: Secondary | ICD-10-CM | POA: Diagnosis not present

## 2022-03-16 DIAGNOSIS — S0990XA Unspecified injury of head, initial encounter: Secondary | ICD-10-CM | POA: Diagnosis present

## 2022-03-16 DIAGNOSIS — M542 Cervicalgia: Secondary | ICD-10-CM | POA: Diagnosis not present

## 2022-03-16 NOTE — ED Notes (Signed)
Called for vitals and no response 

## 2022-03-16 NOTE — ED Triage Notes (Signed)
Pt BIB [facility] from home c/o a fall. Pt slipped and fell and hit the back of his head. Pt does admit to drinking alcohol today. Pt has a laceration to the back of the head and a hematoma. Pt is also c/o neck pain. Bleeding is controlled.  ? ? ?

## 2022-04-19 ENCOUNTER — Other Ambulatory Visit: Payer: Self-pay

## 2022-05-01 ENCOUNTER — Other Ambulatory Visit: Payer: Self-pay

## 2022-05-01 MED ORDER — CEPHALEXIN 500 MG PO CAPS
500.0000 mg | ORAL_CAPSULE | Freq: Three times a day (TID) | ORAL | 0 refills | Status: DC
  Filled 2022-05-01: qty 30, 10d supply, fill #0

## 2022-05-17 ENCOUNTER — Other Ambulatory Visit: Payer: Self-pay

## 2022-06-21 ENCOUNTER — Other Ambulatory Visit: Payer: Self-pay

## 2022-06-22 ENCOUNTER — Other Ambulatory Visit: Payer: Self-pay

## 2022-10-04 ENCOUNTER — Telehealth: Payer: Self-pay | Admitting: Internal Medicine

## 2022-10-04 NOTE — Telephone Encounter (Signed)
Paperwork has not been received at this time, I will keep a look out for the form.

## 2022-10-04 NOTE — Telephone Encounter (Signed)
Pre-Authorization needed for medicaid so the patient can receive Home Health Care services  Adrian Schultz from [facility] called to report that she will be faxing over this form.

## 2022-10-10 ENCOUNTER — Telehealth: Payer: Self-pay | Admitting: Emergency Medicine

## 2022-10-10 NOTE — Telephone Encounter (Signed)
Copied from CRM [id number]. Topic: General - Other >> Oct 10, 2022 11:59 AM Everette C wrote: Reason for CRM: C Marlyne Beards has called to follow up and confirm receipt of the patient's medicare forms related to home health services   [id number] form is needed for the patience to receive services   The forms were originally faxed to the practice on 12/04/21  Please contact to confirm receipt and an update on the status of completion

## 2022-10-12 ENCOUNTER — Ambulatory Visit: Payer: Medicaid Other | Admitting: Nurse Practitioner

## 2022-11-15 ENCOUNTER — Ambulatory Visit: Payer: Medicaid Other | Attending: Physician Assistant | Admitting: Physician Assistant

## 2022-11-15 DIAGNOSIS — E1140 Type 2 diabetes mellitus with diabetic neuropathy, unspecified: Secondary | ICD-10-CM

## 2022-11-15 NOTE — Telephone Encounter (Signed)
Patient came to clinic for appointment today and did not want to wait to be seen by provider.  Rescheduled for 12/18/2022

## 2022-12-18 ENCOUNTER — Ambulatory Visit: Payer: Medicaid Other | Admitting: Internal Medicine

## 2023-02-28 ENCOUNTER — Ambulatory Visit: Payer: Medicaid Other | Admitting: Physician Assistant

## 2023-02-28 ENCOUNTER — Ambulatory Visit: Payer: Self-pay

## 2023-02-28 NOTE — Telephone Encounter (Signed)
  Chief Complaint: abdominal pain Symptoms: lower abdominal pain, comes and goes, 6-7/10 Frequency: 4 months but getting worse Pertinent Negatives: Patient denies diarrhea, vomiting or nausea Disposition: [] ED /[] Urgent Care (no appt availability in office) / [x] Appointment(In office/virtual)/ []  [facility]/ [] Home Care/ [] Refused Recommended Disposition /[] [facility]/ []  Follow-up with PCP Additional Notes: pt states he hasn't taken anything for the pain. Has problems at times with constipation but doesn't feel this is r/t problem. LBM 02/27/23. Oley Balm, [facility] Agent scheduled pt appt today at 56 with Levada Dy, [place]. Care advice given and pt verbalized understanding. No other questions/concerns noted.    Reason for Disposition  [1] MODERATE pain (e.g., interferes with normal activities) AND [2] pain comes and goes (cramps) AND [3] present > 24 hours  (Exception: Pain with Vomiting or Diarrhea - see that Guideline.)  Answer Assessment - Initial Assessment Questions 1. LOCATION: "Where does it hurt?"      Lower stomach  3. ONSET: "When did the pain begin?" (Minutes, hours or days ago)      4 months but gotten worse 5. PATTERN "Does the pain come and go, or is it constant?"    - If it comes and goes: "How long does it last?" "Do you have pain now?"     (Note: Comes and goes means the pain is intermittent. It goes away completely between bouts.)    - If constant: "Is it getting better, staying the same, or getting worse?"      (Note: Constant means the pain never goes away completely; most serious pain is constant and gets worse.)      Comes and goes 6. SEVERITY: "How bad is the pain?"  (e.g., Scale 1-10; mild, moderate, or severe)    - MILD (1-3): Doesn't interfere with normal activities, abdomen soft and not tender to touch.     - MODERATE (4-7): Interferes with normal activities or awakens from sleep, abdomen tender to touch.     - SEVERE (8-10): Excruciating pain,  doubled over, unable to do any normal activities.       6-7/10 9. RELIEVING/AGGRAVATING FACTORS: "What makes it better or worse?" (e.g., antacids, bending or twisting motion, bowel movement)     Sitting makes bettter 10. OTHER SYMPTOMS: "Do you have any other symptoms?" (e.g., back pain, diarrhea, fever, urination pain, vomiting)       Constpation, LBM 02/27/23  Protocols used: Abdominal Pain - Male-A-AH

## 2023-03-14 ENCOUNTER — Ambulatory Visit: Payer: Medicaid Other | Attending: Physician Assistant | Admitting: Physician Assistant

## 2023-03-14 ENCOUNTER — Other Ambulatory Visit: Payer: Self-pay

## 2023-03-14 ENCOUNTER — Encounter: Payer: Self-pay | Admitting: Physician Assistant

## 2023-03-14 VITALS — BP 110/68 | HR 83 | Temp 97.4°F | Ht 66.0 in | Wt 156.0 lb

## 2023-03-14 DIAGNOSIS — E1140 Type 2 diabetes mellitus with diabetic neuropathy, unspecified: Secondary | ICD-10-CM | POA: Insufficient documentation

## 2023-03-14 DIAGNOSIS — Z8249 Family history of ischemic heart disease and other diseases of the circulatory system: Secondary | ICD-10-CM | POA: Diagnosis not present

## 2023-03-14 DIAGNOSIS — F1014 Alcohol abuse with alcohol-induced mood disorder: Secondary | ICD-10-CM

## 2023-03-14 DIAGNOSIS — E1165 Type 2 diabetes mellitus with hyperglycemia: Secondary | ICD-10-CM | POA: Insufficient documentation

## 2023-03-14 DIAGNOSIS — Z79899 Other long term (current) drug therapy: Secondary | ICD-10-CM | POA: Insufficient documentation

## 2023-03-14 DIAGNOSIS — N528 Other male erectile dysfunction: Secondary | ICD-10-CM | POA: Diagnosis not present

## 2023-03-14 DIAGNOSIS — E782 Mixed hyperlipidemia: Secondary | ICD-10-CM

## 2023-03-14 LAB — GLUCOSE, POCT (MANUAL RESULT ENTRY): POC Glucose: 155 mg/dl — AB (ref 70–99)

## 2023-03-14 LAB — POCT GLYCOSYLATED HEMOGLOBIN (HGB A1C): HbA1c, POC (controlled diabetic range): 6.5 % (ref 0.0–7.0)

## 2023-03-14 MED ORDER — PREGABALIN 50 MG PO CAPS
50.0000 mg | ORAL_CAPSULE | Freq: Two times a day (BID) | ORAL | 3 refills | Status: DC
  Filled 2023-03-14: qty 60, 30d supply, fill #0

## 2023-03-14 NOTE — Patient Instructions (Signed)
[place].org is the website for narcotics anonymous Nc23.org (website) or [phone] is the information for alcoholics anonymous Both are free and immediately available for help with alcohol and drug use  

## 2023-03-14 NOTE — Progress Notes (Signed)
Patient ID: Adrian Schultz, male   DOB: 04/23/1966, 58 y.o.   MRN: [id number]   Adrian Schultz, is a 57 y.o. male  [id number]  [id number]  DOB - Nov 11, 1966  Chief Complaint  Patient presents with   Pain    All over body pain/soreness due to neuropathy - requesting meds for pain.         Subjective:   Adrian Schultz is a 57 y.o. male here today for check up.  He says he has reversed his diabetes with diet alone.  He is still drinking alcohol but says it is only about 40 ounces per week.  He is not taking any medications.  He is c/o neuropathy that is esp bad at night.  He was previously taking lyrica and thinks it was working pretty well but he can't remember for sure.    He is also c/o ED with getting and maintaining an erection.  Both his parents died with heart disease.  He denies CP/HA/dizziness   No problems updated.  ALLERGIES: Allergies  Allergen Reactions   Lisinopril Cough    Tolerates Losartan (home med)   Buspar [Buspirone]     Cause nause    PAST MEDICAL HISTORY: Past Medical History:  Diagnosis Date   Diabetes mellitus without complication    ETOH abuse    Hypercholesteremia    Hypertension    Suicidal ideation     MEDICATIONS AT HOME: Prior to Admission medications   Medication Sig Start Date End Date Taking? Authorizing Provider  Accu-Chek Softclix Lancets lancets Check blood sugar three times daily Patient not taking: Reported on 03/14/2023 02/20/22   Marcine Matar, MD  glucose blood (ACCU-CHEK GUIDE) test strip Check blood sugar three times daily Patient not taking: Reported on 03/14/2023 02/20/22   Marcine Matar, MD  insulin glargine (LANTUS SOLOSTAR) 100 UNIT/ML Solostar Pen Inject 10 Units into the skin daily. Patient not taking: Reported on 03/14/2023 02/20/22   Marcine Matar, MD  pregabalin (LYRICA) 50 MG capsule Take 1 capsule by mouth twice a day as directed by physician. 03/14/23   Anders Simmonds, PA-C  glimepiride (AMARYL) 4 MG  tablet Take 1 tablet (4 mg total) by mouth 2 (two) times daily. 09/29/20 11/01/20  Rema Fendt, NP    ROS: Neg HEENT Neg resp Neg cardiac Neg GI Neg GU Neg psych Neg neuro  Objective:   Vitals:   03/14/23 1412  BP: 110/68  Pulse: 83  Temp: (!) 97.4 F (36.3 C)  TempSrc: Oral  SpO2: 100%  Weight: 156 lb (70.8 kg)  Height:  (1.676 m)   Exam General appearance : Awake, alert, not in any distress. Speech Clear. Not toxic looking;  appears if poor health.  Stops mid sentence at times HEENT: Atraumatic and Normocephalic Neck: Supple, no JVD. No cervical lymphadenopathy.  Chest: Good air entry bilaterally, CTAB.  No rales/rhonchi/wheezing CVS: S1 S2 regular, no murmurs.  Extremities: B/L Lower Ext shows no edema, both legs are warm to touch Neurology: Awake alert, and oriented X 3, CN II-XII intact, Non focal Skin: No Rash  Data Review Lab Results  Component Value Date   HGBA1C 6.5 03/14/2023   HGBA1C 9.6 (A) 02/20/2022   HGBA1C 11.1 (A) 11/09/2021    Assessment & Plan   1. Type 2 diabetes mellitus with hyperglycemia, unspecified whether long term insulin use Diet controlled- - Glucose (CBG) - HgB A1c - Ambulatory referral to Cardiology - Comprehensive metabolic panel  2. Mixed  hyperlipidemia Will see if statin is needed - Ambulatory referral to Cardiology - Lipid panel  3. Type 2 diabetes mellitus with chronic painful diabetic neuropathy Can resume- - pregabalin (LYRICA) 50 MG capsule; Take 1 capsule by mouth twice a day as directed by physician.  Dispense: 60 capsule; Refill: 3 Do NOT mix with alcohol  4. Other male erectile dysfunction Needs cardiology clearance before meds like viagra, etc - Ambulatory referral to Cardiology  5. Family history of heart disease - Ambulatory referral to Cardiology  6. Alcohol abuse with alcohol-induced mood disorder He says he has been able to cut way back on his own.  I am providing him free resources in case  he ever needs them  I have counseled the patient at length about substance abuse and addiction.  12 step meetings/recovery recommended.  Local 12 step meeting lists were given and attendance was encouraged.  Patient expresses understanding.  [place].org is the website for narcotics anonymous  [facility].com.cy (website) or (724) [phone] is the information for alcoholics anonymous  Both are free and immediately available for help with alcohol and drug use     Return in about 6 months (around 09/13/2023) for PCP for chronic conditions.  The patient was given clear instructions to go to ER or return to medical center if symptoms don't improve, worsen or new problems develop. The patient verbalized understanding. The patient was told to call to get lab results if they haven't heard anything in the next week.      Georgian Co, PA-C [facility] [place], [place] [phone]   03/14/2023, 2:58 PM

## 2023-03-15 LAB — COMPREHENSIVE METABOLIC PANEL
ALT: 26 IU/L (ref 0–44)
AST: 29 IU/L (ref 0–40)
Albumin/Globulin Ratio: 1.3 (ref 1.2–2.2)
Albumin: 4 g/dL (ref 3.8–4.9)
Alkaline Phosphatase: 117 IU/L (ref 44–121)
BUN/Creatinine Ratio: 15 (ref 9–20)
BUN: 13 mg/dL (ref 6–24)
Bilirubin Total: 1.6 mg/dL — ABNORMAL HIGH (ref 0.0–1.2)
CO2: 20 mmol/L (ref 20–29)
Calcium: 8.9 mg/dL (ref 8.7–10.2)
Chloride: 98 mmol/L (ref 96–106)
Creatinine, Ser: 0.85 mg/dL (ref 0.76–1.27)
Globulin, Total: 3.2 g/dL (ref 1.5–4.5)
Glucose: 154 mg/dL — ABNORMAL HIGH (ref 70–99)
Potassium: 3.7 mmol/L (ref 3.5–5.2)
Sodium: 137 mmol/L (ref 134–144)
Total Protein: 7.2 g/dL (ref 6.0–8.5)
eGFR: 103 mL/min/{1.73_m2} (ref >59)

## 2023-03-15 LAB — LIPID PANEL
Chol/HDL Ratio: 3.4 ratio (ref 0.0–5.0)
Cholesterol, Total: 148 mg/dL (ref 100–199)
HDL: 43 mg/dL (ref >39)
LDL Chol Calc (NIH): 82 mg/dL (ref 0–99)
Triglycerides: 130 mg/dL (ref 0–149)
VLDL Cholesterol Cal: 23 mg/dL (ref 5–40)

## 2023-03-18 ENCOUNTER — Other Ambulatory Visit: Payer: Self-pay | Admitting: *Deleted

## 2023-03-18 DIAGNOSIS — Z135 Encounter for screening for eye and ear disorders: Secondary | ICD-10-CM

## 2023-03-21 ENCOUNTER — Other Ambulatory Visit: Payer: Self-pay

## 2023-05-07 ENCOUNTER — Ambulatory Visit: Payer: Medicaid Other | Attending: Internal Medicine | Admitting: Internal Medicine

## 2023-05-07 ENCOUNTER — Encounter: Payer: Self-pay | Admitting: Internal Medicine

## 2023-07-12 ENCOUNTER — Ambulatory Visit: Payer: MEDICAID | Attending: Cardiology | Admitting: Cardiology

## 2023-07-15 ENCOUNTER — Encounter: Payer: Self-pay | Admitting: Cardiology

## 2023-09-13 ENCOUNTER — Ambulatory Visit: Payer: MEDICAID | Attending: Internal Medicine | Admitting: Internal Medicine

## 2023-09-13 ENCOUNTER — Encounter: Payer: Self-pay | Admitting: Internal Medicine

## 2023-09-13 ENCOUNTER — Other Ambulatory Visit: Payer: Self-pay

## 2023-09-13 VITALS — BP 126/76 | HR 79 | Temp 97.6°F | Ht 66.0 in | Wt 165.0 lb

## 2023-09-13 DIAGNOSIS — Z7984 Long term (current) use of oral hypoglycemic drugs: Secondary | ICD-10-CM

## 2023-09-13 DIAGNOSIS — E1169 Type 2 diabetes mellitus with other specified complication: Secondary | ICD-10-CM

## 2023-09-13 DIAGNOSIS — F1090 Alcohol use, unspecified, uncomplicated: Secondary | ICD-10-CM

## 2023-09-13 DIAGNOSIS — E119 Type 2 diabetes mellitus without complications: Secondary | ICD-10-CM | POA: Diagnosis not present

## 2023-09-13 DIAGNOSIS — Z1211 Encounter for screening for malignant neoplasm of colon: Secondary | ICD-10-CM

## 2023-09-13 DIAGNOSIS — Z2821 Immunization not carried out because of patient refusal: Secondary | ICD-10-CM | POA: Diagnosis not present

## 2023-09-13 DIAGNOSIS — E1140 Type 2 diabetes mellitus with diabetic neuropathy, unspecified: Secondary | ICD-10-CM | POA: Diagnosis not present

## 2023-09-13 DIAGNOSIS — Z5320 Procedure and treatment not carried out because of patient's decision for unspecified reasons: Secondary | ICD-10-CM

## 2023-09-13 DIAGNOSIS — E785 Hyperlipidemia, unspecified: Secondary | ICD-10-CM

## 2023-09-13 LAB — POCT GLYCOSYLATED HEMOGLOBIN (HGB A1C): HbA1c, POC (controlled diabetic range): 5.7 % (ref 0.0–7.0)

## 2023-09-13 LAB — GLUCOSE, POCT (MANUAL RESULT ENTRY): POC Glucose: 118 mg/dL — AB (ref 70–99)

## 2023-09-13 MED ORDER — PREGABALIN 50 MG PO CAPS
50.0000 mg | ORAL_CAPSULE | Freq: Two times a day (BID) | ORAL | 3 refills | Status: AC
  Filled 2023-09-13: qty 60, 30d supply, fill #0

## 2023-09-13 NOTE — Progress Notes (Signed)
Patient ID: Adrian Schultz, male    DOB: December 29, 1965  MRN: [id number]  CC: Diabetes (DM & HTN f/u. [facility] med for neuropathy pain from knees to feet/Trouble sleeping - getting  5-6 hrs of sleep/No to flu vax. )   Subjective: Adrian Schultz is a 57 y.o. male who presents for chronic ds management. His concerns today include:  Pt with hx of HTN, HL, DM with neuropathy, ETOH abuse, anxiety, chronic RT shoulder pain.    DM: Results for orders placed or performed in visit on 09/13/23  POCT glycosylated hemoglobin (Hb A1C)  Result Value Ref Range   Hemoglobin A1C     HbA1c POC (<> result, manual entry)     HbA1c, POC (prediabetic range)     HbA1c, POC (controlled diabetic range) 5.7 0.0 - 7.0 %  POCT glucose (manual entry)  Result Value Ref Range   POC Glucose 118 (A) 70 - 99 mg/dl  Pt diet controlled now.  Reports he cut back on drinking and dropped 10 lbs since I last saw him 01/2022.  Drinks a "little bit of liquor in the mornings and at nights."  1 gallon jug of liquor would last him 4 mths; before it would last a few wks.  Reports he also [facility] his life to [facility].  "I can eat a lot better."  Feels he needs to eat more veggies; not enough fruits.  Eats a lot of nuts -does not check BS -lot of pain in ankle, feet and knees.  Constant ache.  Sharp pain that comes and goes every few mins.  Some burning in the back. Prescribed Lyrica in April never picked it up.  He felt that he did not needed but now is requesting it. HL: lipid profile had significantly improved on last visit 02/2023.  Does not want statin therapy  HM:  declines flu shot and shingrix Patient Active Problem List   Diagnosis Date Noted   Unintentional weight loss 02/20/2022   Psychosis (HCC) 08/12/2019   Microalbuminuria 01/30/2019   Alcohol use disorder, severe, in early remission (HCC) 12/05/2018   Chronic pain of left knee 05/08/2018   Primary localized osteoarthritis of left knee 12/27/2017   Cervicalgia 12/27/2017    Anxiety 12/27/2017   History of panic attacks 12/27/2017   Chronic right shoulder pain 12/27/2017   Essential hypertension 10/04/2017   Alcohol abuse with alcohol-induced mood disorder (HCC) 09/06/2017   Hypercholesteremia 07/04/2017   Type 2 diabetes mellitus without complication, without long-term current use of insulin (HCC) 04/05/2017     Current Outpatient Medications on File Prior to Visit  Medication Sig Dispense Refill   Accu-Chek Softclix Lancets lancets Check blood sugar three times daily 100 each 3   glucose blood (ACCU-CHEK GUIDE) test strip Check blood sugar three times daily 100 each 3   insulin glargine (LANTUS SOLOSTAR) 100 UNIT/ML Solostar Pen Inject 10 Units into the skin daily. (Patient not taking: Reported on 09/13/2023) 15 mL 3   pregabalin (LYRICA) 50 MG capsule Take 1 capsule (50 mg total) by mouth 2 (two) times daily as directed by physician. (Patient not taking: Reported on 09/13/2023) 60 capsule 3   [DISCONTINUED] glimepiride (AMARYL) 4 MG tablet Take 1 tablet (4 mg total) by mouth 2 (two) times daily. 60 tablet 2   No current facility-administered medications on file prior to visit.    Allergies  Allergen Reactions   Lisinopril Cough    Tolerates Losartan (home med)   Buspar [Buspirone]  Cause nause    Social History   Socioeconomic History   Marital status: Divorced    Spouse name: Not on file   Number of children: Not on file   Years of education: Not on file   Highest education level: Not on file  Occupational History   Occupation: Unemployeed  Tobacco Use   Smoking status: Never   Smokeless tobacco: Current    Types: Snuff   Tobacco comments:    daily since 57 years old  Substance and Sexual Activity   Alcohol use: Yes    Alcohol/week: 0.0 standard drinks of alcohol    Comment: beer - daily 1/2 a case    Drug use: No   Sexual activity: Not Currently  Other Topics Concern   Not on file  Social History Narrative   Live in shelter  - [employer]    Social Determinants of Health   Financial Resource Strain: High Risk (09/13/2023)   Overall Financial Resource Strain (CARDIA)    Difficulty of Paying Living Expenses: Hard  Food Insecurity: Food Insecurity Present (09/13/2023)   Hunger Vital Sign    Worried About Running Out of Food in the Last Year: Sometimes true    Ran Out of Food in the Last Year: Sometimes true  Transportation Needs: Unmet Transportation Needs (09/13/2023)   PRAPARE - Administrator, Civil Service (Medical): Yes    Lack of Transportation (Non-Medical): Yes  Physical Activity: Insufficiently Active (09/13/2023)   Exercise Vital Sign    Days of Exercise per Week: 1 day    Minutes of Exercise per Session: 10 min  Stress: Stress Concern Present (09/13/2023)   [employer] of Occupational Health - Occupational Stress Questionnaire    Feeling of Stress : Very much  Social Connections: Moderately Isolated (09/13/2023)   Social Connection and Isolation Panel [NHANES]    Frequency of Communication with Friends and Family: Once a week    Frequency of Social Gatherings with Friends and Family: Once a week    Attends Religious Services: More than 4 times per year    Active Member of [employer] or Organizations: Yes    Attends Banker Meetings: More than 4 times per year    Marital Status: Divorced  Intimate Partner Violence: Not At Risk (09/13/2023)   Humiliation, Afraid, Rape, and Kick questionnaire    Fear of Current or Ex-Partner: No    Emotionally Abused: No    Physically Abused: No    Sexually Abused: No    No family history on file.  Past Surgical History:  Procedure Laterality Date   FOOT SURGERY     KNEE SURGERY      ROS: Review of Systems Negative except as stated above  PHYSICAL EXAM: BP 134/84 (BP Location: Left Arm, Patient Position: Sitting, Cuff Size: Normal)   Pulse 79   Temp 97.6 F (36.4 C) (Oral)   Ht 5\' 6"  (1.676 m)   Wt 165 lb (74.8 kg)    SpO2 98%   BMI 26.63 kg/m   Wt Readings from Last 3 Encounters:  09/13/23 165 lb (74.8 kg)  03/14/23 156 lb (70.8 kg)  03/16/22 161 lb (73 kg)    Physical Exam   General appearance - alert, middle-age Caucasian male.  He is slightly unkept.  He is in NAD.  He ambulates with a cane.  Gait is slow. Mental status -flat affect.  Laughs at inappropriate times. Neck - supple, no significant adenopathy Chest - clear to  auscultation, no wheezes, rales or rhonchi, symmetric air entry Heart - normal rate, regular rhythm, normal S1, S2, no murmurs, rubs, clicks or gallops Extremities - peripheral pulses normal, no pedal edema, no clubbing or cyanosis     Latest Ref Rng & Units 03/14/2023    3:12 PM 02/20/2022   12:03 PM 11/10/2021    3:28 PM  CMP  Glucose 70 - 99 mg/dL 536  644  034   BUN 6 - 24 mg/dL 13  9  9    Creatinine 0.76 - 1.27 mg/dL 7.42  5.95  6.38   Sodium 134 - 144 mmol/L 137  135  132   Potassium 3.5 - 5.2 mmol/L 3.7  3.8  3.6   Chloride 96 - 106 mmol/L 98  93  98   CO2 20 - 29 mmol/L 20  21  22    Calcium 8.7 - 10.2 mg/dL 8.9  9.3  9.1   Total Protein 6.0 - 8.5 g/dL 7.2   7.4   Total Bilirubin 0.0 - 1.2 mg/dL 1.6   1.3   Alkaline Phos 44 - 121 IU/L 117   84   AST 0 - 40 IU/L 29   15   ALT 0 - 44 IU/L 26   19    Lipid Panel     Component Value Date/Time   CHOL 148 03/14/2023 1512   TRIG 130 03/14/2023 1512   HDL 43 03/14/2023 1512   CHOLHDL 3.4 03/14/2023 1512   CHOLHDL 4.4 08/12/2019 1845   VLDL UNABLE TO CALCULATE IF TRIGLYCERIDE OVER 400 mg/dL 75/64/3329 5188   LDLCALC 82 03/14/2023 1512   LDLDIRECT 75.0 08/12/2019 1845    CBC    Component Value Date/Time   WBC 10.4 11/10/2021 1528   RBC 4.89 11/10/2021 1528   HGB 15.6 11/10/2021 1528   HGB 15.8 11/09/2021 1650   HCT 44.9 11/10/2021 1528   HCT 44.7 11/09/2021 1650   PLT 195 11/10/2021 1528   PLT 213 11/09/2021 1650   MCV 91.8 11/10/2021 1528   MCV 94 11/09/2021 1650   MCH 31.9 11/10/2021 1528   MCHC  34.7 11/10/2021 1528   RDW 12.2 11/10/2021 1528   RDW 12.0 11/09/2021 1650   LYMPHSABS 1.8 11/10/2021 1528   MONOABS 0.8 11/10/2021 1528   EOSABS 0.2 11/10/2021 1528   BASOSABS 0.1 11/10/2021 1528    ASSESSMENT AND PLAN: 1. 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  Commended him on this.  He has been able to get off insulin and oral medications completely.  Discussed and encouraged healthy eating habits.  Encouraged him to stop drinking completely.  He is agreeable to restarting Lyrica for his neuropathy symptoms. - POCT glycosylated hemoglobin (Hb A1C) - POCT glucose (manual entry) - Ambulatory referral to Ophthalmology - Microalbumin / creatinine urine ratio - pregabalin (LYRICA) 50 MG capsule; Take 1 capsule (50 mg total) by mouth 2 (two) times daily as directed by physician.  Dispense: 60 capsule; Refill: 3  2. Diabetes mellitus type 2, diet-controlled (HCC) See #1 above.  3. Hyperlipidemia associated with type 2 diabetes mellitus (HCC) Significantly improved but LDL is not at goal.  He declines statin therapy.  4. Influenza vaccination declined Recommended.  Patient declined.  5. Alcohol use disorder Ended him on cutting back.  Encouraged him to try to quit completely.  Patient states that he will.  6. Screening for colon cancer - Fecal occult blood, imunochemical([facility])  7. Statin declined Recommended.  Patient declined.    Patient was given the opportunity to ask questions.  Patient verbalized understanding of the plan and was able to repeat key elements of the plan.   This documentation was completed using Paediatric nurse.  Any transcriptional errors are unintentional.  Orders Placed This Encounter  Procedures   POCT glycosylated hemoglobin (Hb A1C)   POCT glucose (manual entry)     Requested Prescriptions    No prescriptions requested or ordered in this encounter    No  follow-ups on file.  Jonah Blue, MD, FACP

## 2023-09-16 LAB — MICROALBUMIN / CREATININE URINE RATIO
Creatinine, Urine: 121.1 mg/dL
Microalb/Creat Ratio: 42 mg/g{creat} — ABNORMAL HIGH (ref 0–29)
Microalbumin, Urine: 50.4 ug/mL

## 2023-09-30 ENCOUNTER — Other Ambulatory Visit: Payer: Self-pay

## 2024-01-17 ENCOUNTER — Ambulatory Visit: Payer: MEDICAID | Admitting: Internal Medicine

## 2024-06-23 NOTE — Progress Notes (Signed)
 This encounter was created in error - please disregard.

## 2024-12-25 ENCOUNTER — Observation Stay (HOSPITAL_COMMUNITY)
Admission: EM | Admit: 2024-12-25 | Payer: MEDICAID | Source: Home / Self Care | Attending: Family Medicine | Admitting: Family Medicine

## 2024-12-25 ENCOUNTER — Emergency Department (HOSPITAL_COMMUNITY): Payer: MEDICAID

## 2024-12-25 DIAGNOSIS — F1014 Alcohol abuse with alcohol-induced mood disorder: Secondary | ICD-10-CM

## 2024-12-25 DIAGNOSIS — R531 Weakness: Secondary | ICD-10-CM

## 2024-12-25 DIAGNOSIS — Z7902 Long term (current) use of antithrombotics/antiplatelets: Secondary | ICD-10-CM

## 2024-12-25 DIAGNOSIS — E876 Hypokalemia: Secondary | ICD-10-CM | POA: Diagnosis not present

## 2024-12-25 DIAGNOSIS — K625 Hemorrhage of anus and rectum: Secondary | ICD-10-CM

## 2024-12-25 DIAGNOSIS — R1110 Vomiting, unspecified: Principal | ICD-10-CM

## 2024-12-25 DIAGNOSIS — D649 Anemia, unspecified: Secondary | ICD-10-CM

## 2024-12-25 DIAGNOSIS — F1011 Alcohol abuse, in remission: Secondary | ICD-10-CM

## 2024-12-25 DIAGNOSIS — I639 Cerebral infarction, unspecified: Secondary | ICD-10-CM

## 2024-12-25 LAB — GLUCOSE, CAPILLARY: Glucose-Capillary: 127 mg/dL — ABNORMAL HIGH (ref 70–99)

## 2024-12-25 LAB — PHOSPHORUS: Phosphorus: 2.5 mg/dL (ref 2.5–4.6)

## 2024-12-25 LAB — I-STAT CG4 LACTIC ACID, ED
Lactic Acid, Venous: 2.1 mmol/L (ref 0.5–1.9)
Lactic Acid, Venous: 1.7 mmol/L (ref 0.5–1.9)

## 2024-12-25 LAB — LACTIC ACID, PLASMA: Lactic Acid, Venous: 1.1 mmol/L (ref 0.5–1.9)

## 2024-12-25 LAB — COMPREHENSIVE METABOLIC PANEL WITH GFR
ALT: 30 U/L (ref 0–44)
AST: 57 U/L — ABNORMAL HIGH (ref 15–41)
Albumin: 2.7 g/dL — ABNORMAL LOW (ref 3.5–5.0)
Alkaline Phosphatase: 88 U/L (ref 38–126)
Anion gap: 16 — ABNORMAL HIGH (ref 5–15)
BUN: 18 mg/dL (ref 6–20)
CO2: 33 mmol/L — ABNORMAL HIGH (ref 22–32)
Calcium: 7 mg/dL — ABNORMAL LOW (ref 8.9–10.3)
Chloride: 88 mmol/L — ABNORMAL LOW (ref 98–111)
Creatinine, Ser: 1.25 mg/dL — ABNORMAL HIGH (ref 0.61–1.24)
GFR, Estimated: >60 mL/min
Glucose, Bld: 188 mg/dL — ABNORMAL HIGH (ref 70–99)
Potassium: <2 mmol/L — CL (ref 3.5–5.1)
Sodium: 137 mmol/L (ref 135–145)
Total Bilirubin: 2 mg/dL — ABNORMAL HIGH (ref 0.0–1.2)
Total Protein: 6.4 g/dL — ABNORMAL LOW (ref 6.5–8.1)

## 2024-12-25 LAB — TROPONIN T, HIGH SENSITIVITY
Troponin T High Sensitivity: 111 ng/L (ref 0–19)
Troponin T High Sensitivity: 102 ng/L (ref 0–19)

## 2024-12-25 LAB — TSH: TSH: 0.958 u[IU]/mL (ref 0.350–4.500)

## 2024-12-25 LAB — LIPASE, BLOOD: Lipase: 29 U/L (ref 11–51)

## 2024-12-25 LAB — CBC
HCT: 28.4 % — ABNORMAL LOW (ref 39.0–52.0)
Hemoglobin: 10.4 g/dL — ABNORMAL LOW (ref 13.0–17.0)
MCH: 36.4 pg — ABNORMAL HIGH (ref 26.0–34.0)
MCHC: 36.6 g/dL — ABNORMAL HIGH (ref 30.0–36.0)
MCV: 99.3 fL (ref 80.0–100.0)
Platelets: 224 10*3/uL (ref 150–400)
RBC: 2.86 MIL/uL — ABNORMAL LOW (ref 4.22–5.81)
RDW: 14.2 % (ref 11.5–15.5)
WBC: 13.5 10*3/uL — ABNORMAL HIGH (ref 4.0–10.5)
nRBC: 0 % (ref 0.0–0.2)

## 2024-12-25 LAB — BASIC METABOLIC PANEL WITH GFR
Anion gap: 10 (ref 5–15)
BUN: 17 mg/dL (ref 6–20)
CO2: 36 mmol/L — ABNORMAL HIGH (ref 22–32)
Calcium: 6.5 mg/dL — ABNORMAL LOW (ref 8.9–10.3)
Chloride: 89 mmol/L — ABNORMAL LOW (ref 98–111)
Creatinine, Ser: 1.07 mg/dL (ref 0.61–1.24)
GFR, Estimated: >60 mL/min
Glucose, Bld: 146 mg/dL — ABNORMAL HIGH (ref 70–99)
Potassium: <2 mmol/L — CL (ref 3.5–5.1)
Sodium: 134 mmol/L — ABNORMAL LOW (ref 135–145)

## 2024-12-25 LAB — PROTIME-INR
INR: 1.1 (ref 0.8–1.2)
Prothrombin Time: 14.8 s (ref 11.4–15.2)

## 2024-12-25 LAB — MAGNESIUM
Magnesium: 1.2 mg/dL — ABNORMAL LOW (ref 1.7–2.4)
Magnesium: 1.7 mg/dL (ref 1.7–2.4)

## 2024-12-25 LAB — BLOOD GAS, VENOUS
Acid-Base Excess: 15 mmol/L — ABNORMAL HIGH (ref 0.0–2.0)
Bicarbonate: 39.9 mmol/L — ABNORMAL HIGH (ref 20.0–28.0)
O2 Saturation: 57.5 %
Patient temperature: 37
pCO2, Ven: 50 mmHg (ref 44–60)
pH, Ven: 7.51 — ABNORMAL HIGH (ref 7.25–7.43)
pO2, Ven: 35 mmHg (ref 32–45)

## 2024-12-25 LAB — AMMONIA: Ammonia: 45 umol/L — ABNORMAL HIGH (ref 9–35)

## 2024-12-25 LAB — CBG MONITORING, ED: Glucose-Capillary: 173 mg/dL — ABNORMAL HIGH (ref 70–99)

## 2024-12-25 LAB — ETHANOL: Alcohol, Ethyl (B): <15 mg/dL

## 2024-12-25 MED ORDER — ONDANSETRON HCL 4 MG PO TABS: 4.0000 mg | ORAL_TABLET | Freq: Four times a day (QID) | ORAL | Status: AC | PRN

## 2024-12-25 MED ORDER — LACTATED RINGERS IV BOLUS
1000.0000 mL | Freq: Once | INTRAVENOUS | Status: AC
  Administered 2024-12-25: 1000 mL via INTRAVENOUS

## 2024-12-25 MED ORDER — LACTATED RINGERS IV SOLN: INTRAVENOUS | Status: DC

## 2024-12-25 MED ORDER — POTASSIUM CHLORIDE CRYS ER 20 MEQ PO TBCR
60.0000 meq | EXTENDED_RELEASE_TABLET | Freq: Once | ORAL | Status: AC
  Administered 2024-12-25: 60 meq via ORAL
  Filled 2024-12-25: qty 3

## 2024-12-25 MED ORDER — MAGNESIUM SULFATE 2 GM/50ML IV SOLN
2.0000 g | Freq: Once | INTRAVENOUS | Status: AC
  Administered 2024-12-25: 2 g via INTRAVENOUS
  Filled 2024-12-25: qty 50

## 2024-12-25 MED ORDER — ENOXAPARIN SODIUM 40 MG/0.4ML IJ SOSY
40.0000 mg | PREFILLED_SYRINGE | INTRAMUSCULAR | Status: AC
  Administered 2024-12-29 – 2025-01-01 (×4): 40 mg via SUBCUTANEOUS
  Filled 2024-12-25 (×6): qty 0.4

## 2024-12-25 MED ORDER — INSULIN ASPART 100 UNIT/ML IJ SOLN
0.0000 [IU] | Freq: Every day | INTRAMUSCULAR | Status: AC
  Administered 2024-12-30: 2 [IU] via SUBCUTANEOUS
  Filled 2024-12-25: qty 2
  Filled 2024-12-25: qty 5

## 2024-12-25 MED ORDER — POTASSIUM CHLORIDE 10 MEQ/100ML IV SOLN
10.0000 meq | INTRAVENOUS | Status: AC
  Administered 2024-12-25 (×6): 10 meq via INTRAVENOUS
  Filled 2024-12-25 (×6): qty 100

## 2024-12-25 MED ORDER — FOLIC ACID 5 MG/ML IJ SOLN
1.0000 mg | Freq: Every day | INTRAMUSCULAR | Status: DC
  Administered 2024-12-25 – 2024-12-30 (×6): 1 mg via INTRAVENOUS
  Filled 2024-12-25 (×8): qty 0.2

## 2024-12-25 MED ORDER — ONDANSETRON HCL 4 MG/2ML IJ SOLN: 4.0000 mg | Freq: Four times a day (QID) | INTRAMUSCULAR | Status: AC | PRN

## 2024-12-25 MED ORDER — THIAMINE HCL 100 MG/ML IJ SOLN
100.0000 mg | Freq: Once | INTRAMUSCULAR | Status: AC
  Administered 2024-12-25: 100 mg via INTRAVENOUS
  Filled 2024-12-25: qty 2

## 2024-12-25 MED ORDER — ACETAMINOPHEN 650 MG RE SUPP: 650.0000 mg | Freq: Four times a day (QID) | RECTAL | Status: AC | PRN

## 2024-12-25 MED ORDER — ALBUTEROL SULFATE (2.5 MG/3ML) 0.083% IN NEBU: 2.5000 mg | INHALATION_SOLUTION | RESPIRATORY_TRACT | Status: AC | PRN

## 2024-12-25 MED ORDER — ACETAMINOPHEN 325 MG PO TABS
650.0000 mg | ORAL_TABLET | Freq: Four times a day (QID) | ORAL | Status: AC | PRN
  Administered 2024-12-31 – 2025-01-01 (×2): 650 mg via ORAL
  Filled 2024-12-25 (×2): qty 2

## 2024-12-25 MED ORDER — PANTOPRAZOLE SODIUM 40 MG IV SOLR
40.0000 mg | Freq: Once | INTRAVENOUS | Status: AC
  Administered 2024-12-25: 40 mg via INTRAVENOUS
  Filled 2024-12-25: qty 10

## 2024-12-25 MED ORDER — PANTOPRAZOLE SODIUM 40 MG IV SOLR
40.0000 mg | Freq: Every day | INTRAVENOUS | Status: DC
  Administered 2024-12-26 – 2025-01-01 (×7): 40 mg via INTRAVENOUS
  Filled 2024-12-25 (×7): qty 10

## 2024-12-25 MED ORDER — INSULIN ASPART 100 UNIT/ML IJ SOLN
0.0000 [IU] | Freq: Three times a day (TID) | INTRAMUSCULAR | Status: AC
  Administered 2024-12-26 (×3): 3 [IU] via SUBCUTANEOUS
  Administered 2024-12-27 (×2): 2 [IU] via SUBCUTANEOUS
  Administered 2024-12-28: 5 [IU] via SUBCUTANEOUS
  Administered 2024-12-28: 2 [IU] via SUBCUTANEOUS
  Administered 2024-12-28: 3 [IU] via SUBCUTANEOUS
  Administered 2024-12-29: 5 [IU] via SUBCUTANEOUS
  Administered 2024-12-29 – 2024-12-30 (×5): 3 [IU] via SUBCUTANEOUS
  Administered 2024-12-31 (×2): 5 [IU] via SUBCUTANEOUS
  Administered 2024-12-31: 3 [IU] via SUBCUTANEOUS
  Administered 2025-01-01: 2 [IU] via SUBCUTANEOUS
  Administered 2025-01-01: 5 [IU] via SUBCUTANEOUS
  Filled 2024-12-25: qty 3
  Filled 2024-12-25: qty 5
  Filled 2024-12-25: qty 2
  Filled 2024-12-25 (×2): qty 3
  Filled 2024-12-25: qty 5
  Filled 2024-12-25 (×2): qty 3
  Filled 2024-12-25: qty 2
  Filled 2024-12-25: qty 3
  Filled 2024-12-25 (×3): qty 2
  Filled 2024-12-25 (×2): qty 5
  Filled 2024-12-25: qty 3
  Filled 2024-12-25 (×2): qty 5

## 2024-12-25 NOTE — H&P (Signed)
 " History and Physical  Adrian Schultz FMW:[id number] DOB: 07/22/1966 DOA: 12/25/2024  PCP: Patient, No Pcp Per   Chief Complaint: Weakness, vomiting, diarrhea  HPI: Adrian Schultz is a 59 y.o. male with medical history significant for diabetes, hypertension, hyperlipidemia and alcohol abuse who quit drinking several weeks ago is being admitted to the hospital with severe generalized weakness found to have hypomagnesemia, and hypokalemia.  History is provided by the patient who is pleasant and cooperative, states he does not take any of his prescription medications, used to be a pretty heavy drinker, drinking liquor on a daily basis.  Started to make him feel sick with nausea, vomiting and diarrhea and so he quit drinking about 3 to 4 weeks ago.  He went through some withdrawals with anxiety, shakiness, denies any syncope, seizures.  For the last couple of weeks, he has been having almost daily nausea, vomiting, and diarrhea.  Actually it has slowed down and he has not had any vomiting or diarrhea since very early this morning.  He has generalized weakness, to the point that it is difficult to get around and so he came to the hospital for evaluation.  Found to have significant electrolyte derangements as below.  Review of Systems: Please see HPI for pertinent positives and negatives. A complete 10 system review of systems are otherwise negative.  Past Medical History:  Diagnosis Date   Diabetes mellitus without complication (HCC)    ETOH abuse    Hypercholesteremia    Hypertension    Suicidal ideation    Past Surgical History:  Procedure Laterality Date   FOOT SURGERY     KNEE SURGERY     Social History:  reports that he has never smoked. His smokeless tobacco use includes snuff. He reports current alcohol use. He reports that he does not use drugs.  Allergies[1]  No family history on file.   Prior to Admission medications  Medication Sig Start Date End Date Taking? Authorizing Provider   Accu-Chek Softclix Lancets lancets Check blood sugar three times daily 02/20/22   Vicci Barnie NOVAK, MD  glucose blood (ACCU-CHEK GUIDE) test strip Check blood sugar three times daily 02/20/22   Vicci Barnie NOVAK, MD  insulin  glargine (LANTUS  SOLOSTAR) 100 UNIT/ML Solostar Pen Inject 10 Units into the skin daily. Patient not taking: Reported on 09/13/2023 02/20/22   Vicci Barnie NOVAK, MD  pregabalin  (LYRICA ) 50 MG capsule Take 1 capsule (50 mg total) by mouth 2 (two) times daily as directed by physician. 09/13/23   Vicci Barnie NOVAK, MD  glimepiride  (AMARYL ) 4 MG tablet Take 1 tablet (4 mg total) by mouth 2 (two) times daily. 09/29/20 11/01/20  Jaycee Greig PARAS, NP    Physical Exam: BP 104/61 (BP Location: Right Arm)   Pulse 79   Temp 97.9 F (36.6 C) (Oral)   Resp (!) 22   SpO2 92%  General:  Alert, oriented, calm, in no acute distress, looks dehydrated Cardiovascular: RRR, no murmurs or rubs, no peripheral edema  Respiratory: clear to auscultation bilaterally, no wheezes, no crackles  Abdomen: soft, nontender, nondistended, normal bowel tones heard  Skin: dry, no rashes  Musculoskeletal: no joint effusions, normal range of motion  Psychiatric: appropriate affect, normal speech  Neurologic: extraocular muscles intact, clear speech, moving all extremities with intact sensorium         Labs on Admission:  Basic Metabolic Panel: Recent Labs  Lab 12/25/24 1505 12/25/24 1621  NA 137  --   K <2.0*  --  CL 88*  --   CO2 33*  --   GLUCOSE 188*  --   BUN 18  --   CREATININE 1.25*  --   CALCIUM  7.0*  --   MG  --  1.2*  PHOS  --  2.5   Liver Function Tests: Recent Labs  Lab 12/25/24 1505  AST 57*  ALT 30  ALKPHOS 88  BILITOT 2.0*  PROT 6.4*  ALBUMIN 2.7*   Recent Labs  Lab 12/25/24 1621  LIPASE 29   Recent Labs  Lab 12/25/24 1621  AMMONIA 45*   CBC: Recent Labs  Lab 12/25/24 1505  WBC 13.5*  HGB 10.4*  HCT 28.4*  MCV 99.3  PLT 224   Cardiac Enzymes: No  results for input(s): CKTOTAL, CKMB, CKMBINDEX, TROPONINI in the last 168 hours. BNP (last 3 results) No results for input(s): BNP in the last 8760 hours.  ProBNP (last 3 results) No results for input(s): PROBNP in the last 8760 hours.  CBG: Recent Labs  Lab 12/25/24 1455  GLUCAP 173*    Radiological Exams on Admission: DG Chest Port 1 View Result Date: 12/25/2024 EXAM: 1 VIEW(S) XRAY OF THE CHEST 12/25/2024 04:12:00 PM COMPARISON: None available. CLINICAL HISTORY: General weakness. FINDINGS: LUNGS AND PLEURA: Hypoinflated but clear lungs. No pleural effusion. No pneumothorax. HEART AND MEDIASTINUM: No acute abnormality of the cardiac and mediastinal silhouettes. BONES AND SOFT TISSUES: Old healed posterior left seventh rib fracture. Otherwise intact thoracic cage. IMPRESSION: 1. No acute cardiopulmonary abnormality. Electronically signed by: Norleen Boxer MD 12/25/2024 04:42 PM EST RP Workstation: [id number]   Assessment/Plan Adrian Schultz is a 59 y.o. male with medical history significant for diabetes, hypertension, hyperlipidemia and alcohol abuse who quit drinking several weeks ago is being admitted to the hospital with severe generalized weakness found to have hypomagnesemia, and hypokalemia.   Hypokalemia and hypomagnesemia-likely due to poor nutritional status in the setting of alcoholism, as well as recent GI losses from diarrhea and vomiting.  EKG personally reviewed does not appear to have any acute changes, though very low voltage. -Observation admission -Monitor on telemetry -IV repletion of potassium and magnesium  -Recheck electrolytes tonight at 9 PM, and again in the morning  Nausea and diarrhea-likely due to recent alcohol abuse, may have some gastritis though denies any hematemesis or coffee-grounds. -IV PPI daily -Liquid diet for the time being, will plan to advance as tolerated  Alcohol abuse-patient was a daily drinker, drinking liquor.  Stopped drinking  about 3 to 4 weeks ago.  Currently no signs or symptoms of alcohol withdrawal.  Was given IV thiamine  and IV folate in the emergency department.  Type 2 diabetes-previously was supposed to be on Lantus , currently does not take any medications -Update A1c -Carb modified diet once advanced -Sliding scale insulin   DVT prophylaxis: Lovenox      Code Status: Full Code  Consults called: None  Admission status: Observation  Time spent: 49 minutes  Adrian Varano CHRISTELLA Gail MD [facility] Pager [phone]  If 7PM-7AM, please contact night-coverage [email or web address] Password [email or web address]  12/25/2024, 5:27 PM      [1]  Allergies Allergen Reactions   Lisinopril  Cough    Tolerates Losartan  (home med)   Buspar  [Buspirone ]     Cause nause   "

## 2024-12-25 NOTE — ED Provider Notes (Signed)
 " [place] [facility] AT [facility] Provider Note   CSN: [id number] Arrival date & time: 12/25/24  1307     Patient presents with: Weakness   Adrian Schultz is a 59 y.o. male.   HPI Patient reports he has been becoming progressively weak for about 4 to 3 weeks.  He reports that it is kind of the point that he can hardly walk a few steps before he feels like his legs are going to give out or he might pass out.  He reports he has fallen a number of times but has not had an actual episode of loss of consciousness or passing out.  He denies he is experiencing any palpitations, chest pain or shortness of breath.  Patient denies that he has any injuries from falls.  He denies any areas of focal pain at this time.  Patient reports that he has history of heavy alcohol use.  He reports that he quit drinking 4 weeks ago when the symptoms started.  He reports that was the last drink that he had.  He has however continued to have a problem with vomiting and diarrhea.  He reports he vomits a few times a day and might have multiple episodes of diarrhea as well.  This persist despite having stopped drinking.  He denies he has seen any blood in vomit or stool.  Patient denies abdominal pain.    Prior to Admission medications  Medication Sig Start Date End Date Taking? Authorizing Provider  Accu-Chek Softclix Lancets lancets Check blood sugar three times daily 02/20/22   Vicci Barnie NOVAK, MD  glucose blood (ACCU-CHEK GUIDE) test strip Check blood sugar three times daily 02/20/22   Vicci Barnie NOVAK, MD  insulin  glargine (LANTUS  SOLOSTAR) 100 UNIT/ML Solostar Pen Inject 10 Units into the skin daily. Patient not taking: Reported on 09/13/2023 02/20/22   Vicci Barnie NOVAK, MD  pregabalin  (LYRICA ) 50 MG capsule Take 1 capsule (50 mg total) by mouth 2 (two) times daily as directed by physician. 09/13/23   Vicci Barnie NOVAK, MD  glimepiride  (AMARYL ) 4 MG tablet Take 1 tablet (4 mg total) by  mouth 2 (two) times daily. 09/29/20 11/01/20  Jaycee Greig PARAS, NP    Allergies: Lisinopril  and Buspar  [buspirone ]    Review of Systems  Updated Vital Signs BP 104/61 (BP Location: Right Arm)   Pulse 79   Temp 97.9 F (36.6 C) (Oral)   Resp (!) 22   SpO2 92%   Physical Exam Constitutional:      Comments: Patient is alert with clear mental status.  No respiratory distress.  Patient is thin but not cachectic in appearance.  Color normal  HENT:     Head: Normocephalic and atraumatic.     Mouth/Throat:     Mouth: Mucous membranes are dry.     Pharynx: Oropharynx is clear.  Eyes:     Extraocular Movements: Extraocular movements intact.     Conjunctiva/sclera: Conjunctivae normal.  Cardiovascular:     Rate and Rhythm: Normal rate and regular rhythm.  Pulmonary:     Effort: Pulmonary effort is normal.     Breath sounds: Normal breath sounds.  Abdominal:     General: There is no distension.     Palpations: Abdomen is soft.     Tenderness: There is no abdominal tenderness. There is no guarding.     Comments: Patient does not appear to have abdominal ascites.  Musculoskeletal:        General:  Normal range of motion.     Comments: No peripheral edema.  Calves are soft and pliable.  Skin:    General: Skin is warm and dry.     Comments: No jaundice.  Skin is warm and dry.  Neurological:     Comments: Patient is alert.  He does not show signs of acute confusion.  He follows commands appropriately without difficulty.  He does not appear to have focal motor deficit.  No tremor.  Patient was not ambulated.     (all labs ordered are listed, but only abnormal results are displayed) Labs Reviewed  COMPREHENSIVE METABOLIC PANEL WITH GFR - Abnormal; Notable for the following components:      Result Value   Potassium <2.0 (*)    Chloride 88 (*)    CO2 33 (*)    Glucose, Bld 188 (*)    Creatinine, Ser 1.25 (*)    Calcium  7.0 (*)    Total Protein 6.4 (*)    Albumin 2.7 (*)    AST 57 (*)     Total Bilirubin 2.0 (*)    Anion gap 16 (*)    All other components within normal limits  CBC - Abnormal; Notable for the following components:   WBC 13.5 (*)    RBC 2.86 (*)    Hemoglobin 10.4 (*)    HCT 28.4 (*)    MCH 36.4 (*)    MCHC 36.6 (*)    All other components within normal limits  AMMONIA - Abnormal; Notable for the following components:   Ammonia 45 (*)    All other components within normal limits  BLOOD GAS, VENOUS - Abnormal; Notable for the following components:   pH, Ven 7.51 (*)    Bicarbonate 39.9 (*)    Acid-Base Excess 15.0 (*)    All other components within normal limits  CBG MONITORING, ED - Abnormal; Notable for the following components:   Glucose-Capillary 173 (*)    All other components within normal limits  I-STAT CG4 LACTIC ACID, ED - Abnormal; Notable for the following components:   Lactic Acid, Venous 2.1 (*)    All other components within normal limits  ETHANOL  PROTIME-INR  URINALYSIS, ROUTINE W REFLEX MICROSCOPIC  LIPASE, BLOOD  URINE DRUG SCREEN  MAGNESIUM   PHOSPHORUS  TSH  I-STAT CG4 LACTIC ACID, ED  TROPONIN T, HIGH SENSITIVITY  TROPONIN T, HIGH SENSITIVITY    EKG: EKG Interpretation Date/Time:  Friday December 25 2024 13:59:36 EST Ventricular Rate:  82 PR Interval:  158 QRS Duration:  104 QT Interval:  436 QTC Calculation: 494 R Axis:   83  Text Interpretation: Sinus rhythm Atrial premature complex Low voltage, extremity and precordial leads Nonspecific repol abnormality, diffuse leads Borderline prolonged QT interval No sig chnage from previous Confirmed by Armenta Canning [phone]) on 12/25/2024 3:27:41 PM  Radiology: [facility] Chest Port 1 View Result Date: 12/25/2024 EXAM: 1 VIEW(S) XRAY OF THE CHEST 12/25/2024 04:12:00 PM COMPARISON: None available. CLINICAL HISTORY: General weakness. FINDINGS: LUNGS AND PLEURA: Hypoinflated but clear lungs. No pleural effusion. No pneumothorax. HEART AND MEDIASTINUM: No acute abnormality of the  cardiac and mediastinal silhouettes. BONES AND SOFT TISSUES: Old healed posterior left seventh rib fracture. Otherwise intact thoracic cage. IMPRESSION: 1. No acute cardiopulmonary abnormality. Electronically signed by: Norleen Boxer MD 12/25/2024 04:42 PM EST RP Workstation: [id number]     Procedures  CRITICAL CARE Performed by: Canning Armenta   Total critical care time: 30 minutes  Critical care time was exclusive of  separately billable procedures and treating other patients.  Critical care was necessary to treat or prevent imminent or life-threatening deterioration.  Critical care was time spent personally by me on the following activities: development of treatment plan with patient and/or surrogate as well as nursing, discussions with consultants, evaluation of patient's response to treatment, examination of patient, obtaining history from patient or surrogate, ordering and performing treatments and interventions, ordering and review of laboratory studies, ordering and review of radiographic studies, pulse oximetry and re-evaluation of patient's condition.  Medications Ordered in the ED  lactated ringers  infusion ( Intravenous New Bag/Given 12/25/24 1656)  potassium chloride  SA (KLOR-CON  M) CR tablet 60 mEq (has no administration in time range)  potassium chloride  10 mEq in 100 mL IVPB (10 mEq Intravenous New Bag/Given 12/25/24 1656)  folic acid  injection 1 mg (has no administration in time range)  thiamine  (VITAMIN B1) injection 100 mg (has no administration in time range)  pantoprazole  (PROTONIX ) injection 40 mg (has no administration in time range)                                    Medical Decision Making Amount and/or Complexity of Data Reviewed Labs: ordered. Radiology: ordered.  Risk Prescription drug management. Decision regarding hospitalization.   Patient presents as outlined.  He is experiencing significant generalized weakness.  Patient does have history of heavy  and chronic alcohol use but reports having quit 3 to 4 weeks ago.  Patient currently is not showing any objective signs of acute withdrawal and alcohol is nondetectable.  Potassium returns at less than 2 mmol/L, magnesium  1.2 milligram per deciliter patient does maintain normal GFR.  Total bilirubin 2.0.  Patient is profoundly hypokalemic.  This is likely a combination of malabsorption, dietary deficit and vomiting and diarrhea.  Patient has long-term alcoholism and likely this is a contributing factor.  Patient's electrolytes are also deranged with a very low magnesium  as well as calcium  and albumin.  Replacement initiated with both oral and IV potassium and IV fluid.  Will replace magnesium  as well.  Will replace thiamine  and folate with history of heavy alcohol use.  Patient reports vomiting but no hematemesis.  He does have alcohol history although apparently quit 3 to 4 weeks ago.  This does seem probable given no signs of withdrawal and alcohol negative.  Will administer Protonix  for likely gastritis in setting of history of heavy alcohol use and vomiting.  Patient's mental status is currently clear.  He is nontoxic.  I have low suspicion at this time for intercurrent infection.  Patient had multiple metabolic derangements requiring admission.  Consult: Dr. Roxane for admission [facility].     Final diagnoses:  Vomiting and diarrhea  Hypokalemia  Generalized weakness  History of alcohol abuse    ED Discharge Orders     None          Armenta Canning, MD 12/25/24 1725  "

## 2024-12-26 LAB — CBC
HCT: 23.2 % — ABNORMAL LOW (ref 39.0–52.0)
HCT: 24.3 % — ABNORMAL LOW (ref 39.0–52.0)
Hemoglobin: 8.6 g/dL — ABNORMAL LOW (ref 13.0–17.0)
Hemoglobin: 8.7 g/dL — ABNORMAL LOW (ref 13.0–17.0)
MCH: 36 pg — ABNORMAL HIGH (ref 26.0–34.0)
MCH: 35.2 pg — ABNORMAL HIGH (ref 26.0–34.0)
MCHC: 37.1 g/dL — ABNORMAL HIGH (ref 30.0–36.0)
MCHC: 35.8 g/dL (ref 30.0–36.0)
MCV: 97.1 fL (ref 80.0–100.0)
MCV: 98.4 fL (ref 80.0–100.0)
Platelets: 154 10*3/uL (ref 150–400)
Platelets: 141 10*3/uL — ABNORMAL LOW (ref 150–400)
RBC: 2.39 MIL/uL — ABNORMAL LOW (ref 4.22–5.81)
RBC: 2.47 MIL/uL — ABNORMAL LOW (ref 4.22–5.81)
RDW: 14.1 % (ref 11.5–15.5)
RDW: 14.1 % (ref 11.5–15.5)
WBC: 8.2 10*3/uL (ref 4.0–10.5)
WBC: 7.1 10*3/uL (ref 4.0–10.5)
nRBC: 0 % (ref 0.0–0.2)
nRBC: 0 % (ref 0.0–0.2)

## 2024-12-26 LAB — COMPREHENSIVE METABOLIC PANEL WITH GFR
ALT: 25 U/L (ref 0–44)
ALT: 22 U/L (ref 0–44)
AST: 49 U/L — ABNORMAL HIGH (ref 15–41)
AST: 47 U/L — ABNORMAL HIGH (ref 15–41)
Albumin: 2.3 g/dL — ABNORMAL LOW (ref 3.5–5.0)
Albumin: 2.3 g/dL — ABNORMAL LOW (ref 3.5–5.0)
Alkaline Phosphatase: 99 U/L (ref 38–126)
Alkaline Phosphatase: 71 U/L (ref 38–126)
Anion gap: 9 (ref 5–15)
Anion gap: 8 (ref 5–15)
BUN: 15 mg/dL (ref 6–20)
BUN: 13 mg/dL (ref 6–20)
CO2: 35 mmol/L — ABNORMAL HIGH (ref 22–32)
CO2: 32 mmol/L (ref 22–32)
Calcium: 6.6 mg/dL — ABNORMAL LOW (ref 8.9–10.3)
Calcium: 7.2 mg/dL — ABNORMAL LOW (ref 8.9–10.3)
Chloride: 93 mmol/L — ABNORMAL LOW (ref 98–111)
Chloride: 96 mmol/L — ABNORMAL LOW (ref 98–111)
Creatinine, Ser: 0.99 mg/dL (ref 0.61–1.24)
Creatinine, Ser: 0.93 mg/dL (ref 0.61–1.24)
GFR, Estimated: >60 mL/min
GFR, Estimated: >60 mL/min
Glucose, Bld: 115 mg/dL — ABNORMAL HIGH (ref 70–99)
Glucose, Bld: 140 mg/dL — ABNORMAL HIGH (ref 70–99)
Potassium: <2 mmol/L — CL (ref 3.5–5.1)
Potassium: 2.4 mmol/L — CL (ref 3.5–5.1)
Sodium: 138 mmol/L (ref 135–145)
Sodium: 136 mmol/L (ref 135–145)
Total Bilirubin: 1.4 mg/dL — ABNORMAL HIGH (ref 0.0–1.2)
Total Bilirubin: 1.1 mg/dL (ref 0.0–1.2)
Total Protein: 5.4 g/dL — ABNORMAL LOW (ref 6.5–8.1)
Total Protein: 5.5 g/dL — ABNORMAL LOW (ref 6.5–8.1)

## 2024-12-26 LAB — GLUCOSE, CAPILLARY
Glucose-Capillary: 153 mg/dL — ABNORMAL HIGH (ref 70–99)
Glucose-Capillary: 179 mg/dL — ABNORMAL HIGH (ref 70–99)
Glucose-Capillary: 194 mg/dL — ABNORMAL HIGH (ref 70–99)

## 2024-12-26 LAB — MAGNESIUM
Magnesium: 1.7 mg/dL (ref 1.7–2.4)
Magnesium: 2.9 mg/dL — ABNORMAL HIGH (ref 1.7–2.4)

## 2024-12-26 LAB — HEMOGLOBIN A1C
Hgb A1c MFr Bld: 6.2 % — ABNORMAL HIGH (ref 4.8–5.6)
Mean Plasma Glucose: 131.24 mg/dL

## 2024-12-26 LAB — HIV ANTIBODY (ROUTINE TESTING W REFLEX): HIV Screen 4th Generation wRfx: NONREACTIVE

## 2024-12-26 LAB — PHOSPHORUS: Phosphorus: 2.1 mg/dL — ABNORMAL LOW (ref 2.5–4.6)

## 2024-12-26 MED ORDER — MAGNESIUM SULFATE 2 GM/50ML IV SOLN
2.0000 g | Freq: Once | INTRAVENOUS | Status: AC
  Administered 2024-12-26: 2 g via INTRAVENOUS
  Filled 2024-12-26: qty 50

## 2024-12-26 MED ORDER — THIAMINE MONONITRATE 100 MG PO TABS
100.0000 mg | ORAL_TABLET | Freq: Every day | ORAL | Status: DC
  Administered 2024-12-26: 100 mg via ORAL
  Filled 2024-12-26: qty 1

## 2024-12-26 MED ORDER — CALCIUM GLUCONATE-NACL 2-0.675 GM/100ML-% IV SOLN
2.0000 g | Freq: Once | INTRAVENOUS | Status: AC
  Administered 2024-12-26: 2000 mg via INTRAVENOUS
  Filled 2024-12-26: qty 100

## 2024-12-26 MED ORDER — NICOTINE 14 MG/24HR TD PT24
14.0000 mg | MEDICATED_PATCH | Freq: Every day | TRANSDERMAL | Status: AC
  Administered 2024-12-26 – 2025-01-01 (×7): 14 mg via TRANSDERMAL
  Filled 2024-12-26 (×7): qty 1

## 2024-12-26 MED ORDER — CHLORHEXIDINE GLUCONATE CLOTH 2 % EX PADS
6.0000 | MEDICATED_PAD | Freq: Every day | CUTANEOUS | Status: DC
  Administered 2024-12-26 – 2024-12-28 (×3): 6 via TOPICAL

## 2024-12-26 MED ORDER — CALCIUM GLUCONATE-NACL 1-0.675 GM/50ML-% IV SOLN
1.0000 g | Freq: Once | INTRAVENOUS | Status: AC
  Administered 2024-12-26: 1000 mg via INTRAVENOUS
  Filled 2024-12-26: qty 50

## 2024-12-26 MED ORDER — LORAZEPAM 2 MG/ML IJ SOLN
1.0000 mg | INTRAMUSCULAR | Status: DC | PRN
  Administered 2024-12-27: 2 mg via INTRAVENOUS
  Filled 2024-12-26: qty 1

## 2024-12-26 MED ORDER — LORAZEPAM 1 MG PO TABS: 1.0000 mg | ORAL_TABLET | ORAL | Status: DC | PRN

## 2024-12-26 MED ORDER — MAGNESIUM SULFATE 4 GM/100ML IV SOLN
4.0000 g | Freq: Once | INTRAVENOUS | Status: AC
  Administered 2024-12-26: 4 g via INTRAVENOUS
  Filled 2024-12-26: qty 100

## 2024-12-26 MED ORDER — ADULT MULTIVITAMIN W/MINERALS CH
1.0000 | ORAL_TABLET | Freq: Every day | ORAL | Status: DC
  Administered 2024-12-26: 1 via ORAL
  Filled 2024-12-26: qty 1

## 2024-12-26 MED ORDER — POTASSIUM CHLORIDE IN NACL 40-0.9 MEQ/L-% IV SOLN
INTRAVENOUS | Status: DC
  Filled 2024-12-26 (×4): qty 1000

## 2024-12-26 MED ORDER — LORAZEPAM 1 MG PO TABS
0.0000 mg | ORAL_TABLET | ORAL | Status: DC
  Administered 2024-12-26 (×2): 1 mg via ORAL
  Filled 2024-12-26 (×2): qty 1

## 2024-12-26 MED ORDER — SODIUM CHLORIDE 0.9 % IV SOLN: INTRAVENOUS | Status: DC

## 2024-12-26 MED ORDER — FOLIC ACID 1 MG PO TABS: 1.0000 mg | ORAL_TABLET | Freq: Every day | ORAL | Status: DC

## 2024-12-26 MED ORDER — POTASSIUM CHLORIDE 10 MEQ/100ML IV SOLN
10.0000 meq | INTRAVENOUS | Status: AC
  Administered 2024-12-26 (×6): 10 meq via INTRAVENOUS
  Filled 2024-12-26 (×6): qty 100

## 2024-12-26 MED ORDER — THIAMINE HCL 100 MG/ML IJ SOLN
100.0000 mg | Freq: Every day | INTRAMUSCULAR | Status: DC
  Administered 2024-12-26: 100 mg via INTRAVENOUS
  Filled 2024-12-26: qty 2

## 2024-12-26 MED ORDER — LORAZEPAM 1 MG PO TABS: 0.0000 mg | ORAL_TABLET | Freq: Three times a day (TID) | ORAL | Status: DC

## 2024-12-26 MED ORDER — THIAMINE HCL 100 MG/ML IJ SOLN
100.0000 mg | Freq: Every day | INTRAMUSCULAR | Status: DC
  Administered 2024-12-27: 100 mg via INTRAVENOUS
  Filled 2024-12-26: qty 2

## 2024-12-26 NOTE — Plan of Care (Signed)
   Problem: Safety: Goal: Ability to remain free from injury will improve Outcome: Progressing   Problem: Skin Integrity: Goal: Risk for impaired skin integrity will decrease Outcome: Progressing

## 2024-12-26 NOTE — Plan of Care (Incomplete)

## 2024-12-26 NOTE — Progress Notes (Signed)
 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, HTN HLD chronic right shoulder pain as well as cervical radiculopathy 1/30 brought in from home weakness-left lower leg with feeling of passing out no palpitations chest pain quit drinking 4 weeks ago associated with intermittent nausea vomiting and diarrhea Sodium 137 potassium less than 2 BUN/creatinine 18/1.2 calcium  7 anion gap 18-LFTs normal-high-sensitivity troponin 111 lactic acid 2, WBC 13.6 hemoglobin 10.4 platelet 224 CXR no acute cardiopulmonary findings    Assessment  & Plan :    Weakness in the setting of severe electrolyte abnormalities   metabolic alkalosis likely secondary to loss of electrolytes and stool/nausea vomiting Hypokalemia--profound-likely related to other electrolyte abnormalities alcoholism-continue 6 runs of 1060 mill equivalents and placed 60 mill equivalents and 100 cc of saline and replace Hypomagnesemia-magnesium  1.7-replace aggressively 2 g and recheck all labs this afternoon around 3 PM Hypocalcemia-replace with 2 g of IV calcium  and monitor trends  alkalosis should resolve Expect he will improve over the next 24 to 48 hours-would keep on telemetry for severe electrolyte abnormality  DM TY 2 A1c 6.2 Currently on moderate sliding scale-Home meds include Lantus  10 Lyrica  50 twice daily but currently he is not taking some these meds  CBGs here 115-180   HTN HLD  supposed to be on statin, ACE but is not taking   artifactual leukocytosis anemia  likely secondary to volume depletion and poor po.  Expect will recover once eating and electrolytes replaced  recently quit alcohol  congratulated-monitor trends  Data Reviewed today:  sodium 138 potassium 2.0 chloride  93 BUN/creatinine 15/0.9 bicarb 35 magnesium  1.7 AST/ALT 40/25  bilirubin 1.4 WBC 8.2 hemoglobin 8.8 platelet 154  DVT prophylaxis: Lovenox   Dispo/Global plan: Inpatient pending resolution     Subjective:   Well hungry no fever no chills--no abd pain , diarr since yesterday.  No n/v no dark stool Hasn't been up oob yet.  Looks comfortable  Objective + exam Vitals:   12/26/24 0000 12/26/24 0040 12/26/24 0400 12/26/24 0402  BP:  134/70  (!) 134/47  Pulse:  86  82  Resp:  (!) 29  15  Temp: 98 F (36.7 C)  97.9 F (36.6 C)   TempSrc: Oral  Oral   SpO2:  95%  94%   There were no vitals filed for this visit.   Examination: eomi ncat no focal deficit Multiple tattoo S1 s2 no m/r/g Abd soft nt nd no rebound no guard Rom intact moves limbs without deficit--no tremor   Scheduled Meds:  Chlorhexidine  Gluconate Cloth  6 each Topical Daily   enoxaparin  (LOVENOX ) injection  40 mg Subcutaneous Q24H   folic acid   1 mg Intravenous Daily   insulin  aspart  0-15 Units Subcutaneous TID WC   insulin  aspart  0-5 Units Subcutaneous QHS   pantoprazole  (PROTONIX ) IV  40 mg Intravenous Daily   Continuous Infusions:  0.9 % NaCl with KCl 40 mEq / L     calcium  gluconate     magnesium  sulfate bolus IVPB 4 g (12/26/24 0819)   potassium chloride  10 mEq (12/26/24 0807)   acetaminophen  **OR** acetaminophen , albuterol , ondansetron  **OR** ondansetron  (ZOFRAN ) IV  I spent 44 minutes today before, during and after this patient  interview and examination--reviewing pertinent data, coordinating the patient's care and in communication with the care team and other medical professionals  Colen Grimes, MD  [facility]

## 2024-12-27 ENCOUNTER — Encounter (HOSPITAL_COMMUNITY): Payer: Self-pay | Admitting: Internal Medicine

## 2024-12-27 ENCOUNTER — Inpatient Hospital Stay (HOSPITAL_COMMUNITY): Payer: MEDICAID

## 2024-12-27 DIAGNOSIS — E876 Hypokalemia: Secondary | ICD-10-CM | POA: Diagnosis not present

## 2024-12-27 DIAGNOSIS — I639 Cerebral infarction, unspecified: Secondary | ICD-10-CM

## 2024-12-27 DIAGNOSIS — R29714 NIHSS score 14: Secondary | ICD-10-CM | POA: Diagnosis not present

## 2024-12-27 LAB — CBC WITH DIFFERENTIAL/PLATELET
Abs Immature Granulocytes: 0.05 10*3/uL (ref 0.00–0.07)
Basophils Absolute: 0 10*3/uL (ref 0.0–0.1)
Basophils Relative: 0 %
Eosinophils Absolute: 0.1 10*3/uL (ref 0.0–0.5)
Eosinophils Relative: 1 %
HCT: 22.5 % — ABNORMAL LOW (ref 39.0–52.0)
Hemoglobin: 8 g/dL — ABNORMAL LOW (ref 13.0–17.0)
Immature Granulocytes: 1 %
Lymphocytes Relative: 9 %
Lymphs Abs: 0.7 10*3/uL (ref 0.7–4.0)
MCH: 35.9 pg — ABNORMAL HIGH (ref 26.0–34.0)
MCHC: 35.6 g/dL (ref 30.0–36.0)
MCV: 100.9 fL — ABNORMAL HIGH (ref 80.0–100.0)
Monocytes Absolute: 0.5 10*3/uL (ref 0.1–1.0)
Monocytes Relative: 7 %
Neutro Abs: 6.4 10*3/uL (ref 1.7–7.7)
Neutrophils Relative %: 82 %
Platelets: 161 10*3/uL (ref 150–400)
RBC: 2.23 MIL/uL — ABNORMAL LOW (ref 4.22–5.81)
RDW: 14.6 % (ref 11.5–15.5)
WBC: 7.8 10*3/uL (ref 4.0–10.5)
nRBC: 0 % (ref 0.0–0.2)

## 2024-12-27 LAB — COMPREHENSIVE METABOLIC PANEL WITH GFR
ALT: 16 U/L (ref 0–44)
AST: 33 U/L (ref 15–41)
Albumin: 2.1 g/dL — ABNORMAL LOW (ref 3.5–5.0)
Alkaline Phosphatase: 63 U/L (ref 38–126)
Anion gap: 2 — ABNORMAL LOW (ref 5–15)
BUN: 10 mg/dL (ref 6–20)
CO2: 28 mmol/L (ref 22–32)
Calcium: 6.3 mg/dL — CL (ref 8.9–10.3)
Chloride: 111 mmol/L (ref 98–111)
Creatinine, Ser: 0.78 mg/dL (ref 0.61–1.24)
GFR, Estimated: >60 mL/min
Glucose, Bld: 131 mg/dL — ABNORMAL HIGH (ref 70–99)
Potassium: >7.5 mmol/L (ref 3.5–5.1)
Sodium: 140 mmol/L (ref 135–145)
Total Bilirubin: 1.1 mg/dL (ref 0.0–1.2)
Total Protein: 4.9 g/dL — ABNORMAL LOW (ref 6.5–8.1)

## 2024-12-27 LAB — POTASSIUM
Potassium: 2.2 mmol/L — CL (ref 3.5–5.1)
Potassium: 2.8 mmol/L — ABNORMAL LOW (ref 3.5–5.1)

## 2024-12-27 LAB — GLUCOSE, CAPILLARY
Glucose-Capillary: 138 mg/dL — ABNORMAL HIGH (ref 70–99)
Glucose-Capillary: 109 mg/dL — ABNORMAL HIGH (ref 70–99)

## 2024-12-27 LAB — MAGNESIUM: Magnesium: 1.9 mg/dL (ref 1.7–2.4)

## 2024-12-27 MED ORDER — CALCIUM GLUCONATE-NACL 1-0.675 GM/50ML-% IV SOLN: 1.0000 g | Freq: Once | INTRAVENOUS | Status: DC

## 2024-12-27 MED ORDER — ASPIRIN 300 MG RE SUPP: 300.0000 mg | Freq: Every day | RECTAL | Status: DC

## 2024-12-27 MED ORDER — SODIUM CHLORIDE 0.9 % IV SOLN: INTRAVENOUS | Status: DC

## 2024-12-27 MED ORDER — CALCIUM GLUCONATE-NACL 1-0.675 GM/50ML-% IV SOLN
1.0000 g | Freq: Once | INTRAVENOUS | Status: AC
  Administered 2024-12-27: 1000 mg via INTRAVENOUS
  Filled 2024-12-27: qty 50

## 2024-12-27 MED ORDER — ASPIRIN 325 MG PO TABS
325.0000 mg | ORAL_TABLET | Freq: Every day | ORAL | Status: DC
  Administered 2024-12-28: 325 mg via ORAL
  Filled 2024-12-27: qty 1

## 2024-12-27 MED ORDER — CALCIUM GLUCONATE 10 % IV SOLN: 1.0000 g | Freq: Once | INTRAVENOUS | Status: DC

## 2024-12-27 MED ORDER — SODIUM BICARBONATE 8.4 % IV SOLN
50.0000 meq | Freq: Once | INTRAVENOUS | Status: AC
  Administered 2024-12-27: 50 meq via INTRAVENOUS
  Filled 2024-12-27: qty 50

## 2024-12-27 MED ORDER — IOHEXOL 350 MG/ML SOLN
75.0000 mL | Freq: Once | INTRAVENOUS | Status: AC | PRN
  Administered 2024-12-27: 75 mL via INTRAVENOUS

## 2024-12-27 MED ORDER — FUROSEMIDE 20 MG PO TABS: 20.0000 mg | ORAL_TABLET | Freq: Once | ORAL | Status: DC

## 2024-12-27 MED ORDER — STROKE: EARLY STAGES OF RECOVERY BOOK
Freq: Once | Status: AC
  Filled 2024-12-27: qty 1

## 2024-12-27 MED ORDER — POTASSIUM CHLORIDE 10 MEQ/100ML IV SOLN
10.0000 meq | INTRAVENOUS | Status: AC
  Administered 2024-12-27 (×4): 10 meq via INTRAVENOUS
  Filled 2024-12-27 (×4): qty 100

## 2024-12-27 MED ORDER — SODIUM ZIRCONIUM CYCLOSILICATE 5 G PO PACK: 5.0000 g | PACK | Freq: Once | ORAL | Status: DC

## 2024-12-27 MED ORDER — IOHEXOL 300 MG/ML  SOLN: 75.0000 mL | Freq: Once | INTRAMUSCULAR | Status: DC | PRN

## 2024-12-27 MED ORDER — FUROSEMIDE 10 MG/ML IJ SOLN
20.0000 mg | Freq: Once | INTRAMUSCULAR | Status: AC
  Administered 2024-12-27: 20 mg via INTRAVENOUS
  Filled 2024-12-27: qty 2

## 2024-12-27 NOTE — Progress Notes (Signed)
 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, HTN HLD chronic right shoulder pain as well as cervical radiculopathy 1/30 brought in from home weakness-left lower leg with feeling of passing out no palpitations chest pain quit drinking 4 weeks ago associated with intermittent nausea vomiting and diarrhea Sodium 137 potassium less than 2 BUN/creatinine 18/1.2 calcium  7 anion gap 18-LFTs normal-high-sensitivity troponin 111 lactic acid 2, WBC 13.6 hemoglobin 10.4 platelet 224 CXR no acute cardiopulmonary findings 2/1 became more altered-imaging MRI = right frontal and high left frontal lobe infarcts, remote infarct left corona radiata anterior right frontal lobe extensive periventricular white matter changes-suggestive of small strokes-CT scan showed calcific plaque carotid siphons less than 50% stenosis near occlusive stenosis which is chronic 90% in the right internal carotid -telemetry neuro consulted started aspirin  Plavix    Assessment  & Plan :    Weakness\hypokalemia/AKI on admit Initially weakness felt to be secondary to severe electrolyte abnormalities-see below Labs today are spuriously and not sure if patient had hyperkalemia Was given 1 dose of Kayexalate 5 and IV fluid with K stopped replacement stopped Repeat labs however then showed potassium of 2.2-Will give runs of potassium KCl x 4 and repeat potassium levels over the next several hours to confirm veracity of labs  Code stroke called 2/1-new small strokes acute white matter right frontal high left frontal lobe on MRI Patient more sleepy confused less verbal with right-sided weakness prompting evaluation Neurology service to see formally tomorrow appreciate teleneuro input-patient is  pocketing foods to some degree and although he is able to swallow he refuses to do so Would keep on clear liquid diet and get formal therapy eval's   DM TY 2 A1c 6.2--Home meds Lantus  10 Lyrica  50 twice daily but not taking apparently at home CBGs 127-194-continues sliding scale coverage with at bedtime moderate -Add Lantus  if he is eating more   HTN HLD  supposed to be on statin, ACE but is not taking   artifactual leukocytosis anemia  likely secondary to volume depletion and poor po leukocytosis is improved trend  recently quit alcohol 1/31 was placed on CIWA protocol after information from nursing revealed that he occasionally drinks he had his last drink on Sunday I do not believe he is actively withdrawing I think that he may have had confusion because of his strokes We will watch him closely  Data Reviewed today: 140 potassium 7.5 calcium  6.3 BUN/creatinine 10/0 point WBC 7.8 161  DVT prophylaxis: Lovenox   Dispo/Global plan: Inpatient pending resolution     Subjective:   Lethargic this morning when I saw him when he was more awake and he was quite weak on his right side unable to raise his right arm sleepy I accompanied nursing downstairs he had a code stroke workup and appears to have some strokes in multiple areas We will do full workup He is a little bit more conversant when I see him in the afternoon but increased paucity of speech as well as slight slurring He gets confused at time is unable to tell me who his daughter has cannot recall her number and is tangential   Objective + exam Vitals:   12/27/24 0700 12/27/24 0800 12/27/24 0900 12/27/24 1000  BP: ROLLEN)  113/50 (!) 116/57 (!) 115/47 (!) 154/88  Pulse: 82 82 84 91  Resp: (!) 25 (!) 27 (!) 28 (!) 26  Temp:  99.5 F (37.5 C)    TempSrc:  Axillary    SpO2: 100% 100% 91% 100%  Weight:      Height:       Filed Weights   12/26/24 1850  Weight: 83.9 kg     Examination:  EOMI NCAT pupils reactive to  light Tongue protrudes but or effort Right arm is stronger metabolic disorder he is able to raise his elbow off the bed Grip is reasonably strong bilaterally I did not do finger-nose-finger testing Abdomen soft no rebound Powers 5/5 but is a little bit limited on the right side No lower extremity edema Abdomen soft no rebound   Scheduled Meds:  [START ON 12/28/2024]  stroke: early stages of recovery book   Does not apply Once   [START ON 12/28/2024] aspirin   300 mg Rectal Daily   Or   [START ON 12/28/2024] aspirin   325 mg Oral Daily   Chlorhexidine  Gluconate Cloth  6 each Topical Daily   enoxaparin  (LOVENOX ) injection  40 mg Subcutaneous Q24H   folic acid   1 mg Intravenous Daily   furosemide   20 mg Intravenous Once   insulin  aspart  0-15 Units Subcutaneous TID WC   insulin  aspart  0-5 Units Subcutaneous QHS   nicotine   14 mg Transdermal Daily   pantoprazole  (PROTONIX ) IV  40 mg Intravenous Daily   sodium zirconium cyclosilicate   5 g Oral Once   Continuous Infusions:  potassium chloride      acetaminophen  **OR** acetaminophen , albuterol , LORazepam  **OR** LORazepam , ondansetron  **OR** ondansetron  (ZOFRAN ) IV  CRITICAL CARE Performed by: Jai-Gurmukh Dyllan Hughett   Total critical care time: 37 minutes  Critical care time was exclusive of separately billable procedures and treating other patients.  Critical care was necessary to treat or prevent imminent or life-threatening deterioration.  Critical care was time spent personally by me on the following activities: development of treatment plan with patient and/or surrogate as well as nursing, discussions with consultants, evaluation of patient's response to treatment, examination of patient, obtaining history from patient or surrogate, ordering and performing treatments and interventions, ordering and review of laboratory studies, ordering and review of radiographic studies, pulse oximetry and re-evaluation of patient's condition.   I spent  44 minutes today before, during and after this patient interview and examination--reviewing pertinent data, coordinating the patient's care and in communication with the care team and other medical professionals  Colen Grimes, MD  [facility]

## 2024-12-27 NOTE — Plan of Care (Signed)
   Problem: Safety: Goal: Ability to remain free from injury will improve Outcome: Progressing   Problem: Skin Integrity: Goal: Risk for impaired skin integrity will decrease Outcome: Progressing

## 2024-12-27 NOTE — Consult Note (Signed)
 [facility] Telemedicine Consult   Requesting Provider: Royal Consult Participants: Nurse Location of the provider: [facility] Location of the patient: [facility] inpatient   This consult was provided via telemedicine with 2-way video and audio communication. The patient/family was informed that care would be provided in this way and agreed to receive care in this manner.    Chief Complaint: Right sided weakness  HPI: 59 y.o. male with medical history significant for diabetes, hypertension, hyperlipidemia and alcohol abuse who quit drinking several weeks ago is being admitted to the hospital with severe generalized weakness found to have hypomagnesemia, and hypokalemia.  Has been receiving Ativan  on CIWA protocol.  Noted this morning to be less verbal and have right sided weakness.  Code stroke called at that time.   LKW: 12/27/24 @ 0400 tnk given?: No, Outside time window IR Thrombectomy? No, No target lesion identified Modified Rankin Scale: 0-Completely asymptomatic and back to baseline post- stroke Time of teleneurologist evaluation: 0918  Exam: Vitals:   12/27/24 0700 12/27/24 0800  BP: (!) 113/50 (!) 116/57  Pulse: 82 82  Resp: (!) 25 (!) 27  Temp:  99.5 F (37.5 C)  SpO2: 100% 100%    General: NAD  1A: Level of Consciousness - 1 1B: Ask Month and Age - 2 1C: 'Blink Eyes' & 'Squeeze Hands' - 0 2: Test Horizontal Extraocular Movements - 0 3: Test Visual Fields - 0 4: Test Facial Palsy - 1 5A: Test Left Arm Motor Drift - 1 5B: Test Right Arm Motor Drift - 1  (greater drift than that seen on the left) 6A: Test Left Leg Motor Drift - 3 6B: Test Right Leg Motor Drift - 3 7: Test Limb Ataxia - 0 8: Test Sensation - 1  (RLE) 9: Test Language/Aphasia- 0 10: Test Dysarthria - 1 11: Test Extinction/Inattention - 0 NIHSS score: 14   Imaging Reviewed:  CT HEAD WITHOUT CONTRAST 12/27/2024 09:34:35 AM   TECHNIQUE: CT of the head was performed without the administration  of intravenous contrast. Automated exposure control, iterative reconstruction, and/or weight based adjustment of the mA/kV was utilized to reduce the radiation dose to as low as reasonably achievable.   COMPARISON: CT of the head dated 03/11/2016.   CLINICAL HISTORY: Acute neurologic deficit; stroke suspected.   FINDINGS:   BRAIN AND VENTRICLES: No acute hemorrhage. No evidence of acute infarct. Chronic infarcts in bilateral basal ganglia. Moderate chronic microvascular ischemic change. No hydrocephalus. No extra-axial collection. No mass effect or midline shift.   ORBITS: No acute abnormality.   SINUSES: No acute abnormality.   SOFT TISSUES AND SKULL: No acute soft tissue abnormality. No skull fracture.   Please note that at the time of the report, 2 separate attempts to contact Doctor Royal were made utilizing pager [phone].   [place] Stroke Program Early CT Score (ASPECTS)   -----   Ganglionic (caudate, IC, lentiform nucleus, insula, M1-M3): 7   Supraganglionic (M4-M6): 3   Total: 10   IMPRESSION: 1. No acute intracranial abnormality in the setting of acute neurologic deficit; stroke suspected. 2. 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  Labs reviewed in epic and pertinent values follow: CBG 123   Assessment:  59 y.o. male with medical history significant for diabetes, hypertension, hyperlipidemia and alcohol abuse who quit drinking several weeks ago is being admitted to the hospital with severe generalized weakness found to have hypomagnesemia, and hypokalemia.  Has been receiving Ativan  on CIWA protocol.  Noted  this morning to be less verbal and have right sided weakness.  NIHSS of 14. Head CT personally reviewed and reveals no evidence of hemorrhage.  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  Patient asymptomatic  from this lesion.   Patient has evidence of prior infarcts on CT and current presentation may be a consequence of some unmasking of symptoms due to his current illness/benzo use but can not rule out an acute infarct.  Further work up recommended.    Recommendations:  ASA 325mg  and Plavix  300mg  now. MRI of the brain without contrast.  If no evidence of acute infarct, no further stroke work up indicated.  If evidence of an acute infarct would proceed with stroke work up. Telemetry Frequent neuro checks Vascular evaluation for severe RICA stenosis  Case discussed with Dr. Royal   This patient is receiving care for possible acute neurological changes. Care by this provider at the time of service included time for direct evaluation via telemedicine, review of medical records, imaging studies and discussion of findings with providers, the patient and/or family.  Sonny Hock, MD Neurology   If 8pm- 8am, please page neurology on call as listed in AMION.

## 2024-12-27 NOTE — Plan of Care (Incomplete)
   Problem: Education: Goal: Ability to describe self-care measures that may prevent or decrease complications (Diabetes Survival Skills Education) will improve Outcome: Not Progressing Goal: Individualized Educational Video(s) Outcome: Not Progressing   Problem: Coping: Goal: Ability to adjust to condition or change in health will improve Outcome: Not Progressing   Problem: Fluid Volume: Goal: Ability to maintain a balanced intake and output will improve Outcome: Not Progressing   Problem: Health Behavior/Discharge Planning: Goal: Ability to identify and utilize available resources and services will improve Outcome: Not Progressing Goal: Ability to manage health-related needs will improve Outcome: Not Progressing   Problem: Metabolic: Goal: Ability to maintain appropriate glucose levels will improve Outcome: Not Progressing   Problem: Nutritional: Goal: Maintenance of adequate nutrition will improve Outcome: Not Progressing Goal: Progress toward achieving an optimal weight will improve Outcome: Not Progressing   Problem: Skin Integrity: Goal: Risk for impaired skin integrity will decrease Outcome: Not Progressing   Problem: Tissue Perfusion: Goal: Adequacy of tissue perfusion will improve Outcome: Not Progressing   Problem: Education: Goal: Knowledge of General Education information will improve Description: Including pain rating scale, medication(s)/side effects and non-pharmacologic comfort measures Outcome: Not Progressing   Problem: Health Behavior/Discharge Planning: Goal: Ability to manage health-related needs will improve Outcome: Not Progressing   Problem: Clinical Measurements: Goal: Ability to maintain clinical measurements within normal limits will improve Outcome: Not Progressing Goal: Will remain free from infection Outcome: Not Progressing Goal: Diagnostic test results will improve Outcome: Not Progressing Goal: Respiratory complications will  improve Outcome: Not Progressing Goal: Cardiovascular complication will be avoided Outcome: Not Progressing   Problem: Activity: Goal: Risk for activity intolerance will decrease Outcome: Not Progressing   Problem: Nutrition: Goal: Adequate nutrition will be maintained Outcome: Not Progressing   Problem: Coping: Goal: Level of anxiety will decrease Outcome: Not Progressing   Problem: Elimination: Goal: Will not experience complications related to bowel motility Outcome: Not Progressing Goal: Will not experience complications related to urinary retention Outcome: Not Progressing   Problem: Pain Managment: Goal: General experience of comfort will improve and/or be controlled Outcome: Not Progressing   Problem: Safety: Goal: Ability to remain free from injury will improve Outcome: Not Progressing   Problem: Skin Integrity: Goal: Risk for impaired skin integrity will decrease Outcome: Not Progressing   Problem: Education: Goal: Knowledge of disease or condition will improve Outcome: Not Progressing Goal: Knowledge of secondary prevention will improve (MUST DOCUMENT ALL) Outcome: Not Progressing Goal: Knowledge of patient specific risk factors will improve (DELETE if not current risk factor) Outcome: Not Progressing   Problem: Ischemic Stroke/TIA Tissue Perfusion: Goal: Complications of ischemic stroke/TIA will be minimized Outcome: Not Progressing   Problem: Coping: Goal: Will verbalize positive feelings about self Outcome: Not Progressing Goal: Will identify appropriate support needs Outcome: Not Progressing   Problem: Health Behavior/Discharge Planning: Goal: Ability to manage health-related needs will improve Outcome: Not Progressing Goal: Goals will be collaboratively established with patient/family Outcome: Not Progressing   Problem: Self-Care: Goal: Ability to participate in self-care as condition permits will improve Outcome: Not Progressing Goal:  Verbalization of feelings and concerns over difficulty with self-care will improve Outcome: Not Progressing Goal: Ability to communicate needs accurately will improve Outcome: Not Progressing   Problem: Nutrition: Goal: Risk of aspiration will decrease Outcome: Not Progressing Goal: Dietary intake will improve Outcome: Not Progressing

## 2024-12-27 NOTE — Progress Notes (Signed)
 NIH completed at 2300 with Devan H. RN. Documented in EHR flowsheet.

## 2024-12-28 ENCOUNTER — Inpatient Hospital Stay (HOSPITAL_COMMUNITY): Payer: MEDICAID

## 2024-12-28 DIAGNOSIS — I639 Cerebral infarction, unspecified: Secondary | ICD-10-CM

## 2024-12-28 DIAGNOSIS — I6389 Other cerebral infarction: Secondary | ICD-10-CM | POA: Diagnosis not present

## 2024-12-28 DIAGNOSIS — I7090 Unspecified atherosclerosis: Secondary | ICD-10-CM

## 2024-12-28 DIAGNOSIS — I10 Essential (primary) hypertension: Secondary | ICD-10-CM | POA: Diagnosis not present

## 2024-12-28 DIAGNOSIS — I6350 Cerebral infarction due to unspecified occlusion or stenosis of unspecified cerebral artery: Secondary | ICD-10-CM

## 2024-12-28 DIAGNOSIS — E876 Hypokalemia: Secondary | ICD-10-CM | POA: Diagnosis not present

## 2024-12-28 DIAGNOSIS — F17220 Nicotine dependence, chewing tobacco, uncomplicated: Secondary | ICD-10-CM | POA: Diagnosis not present

## 2024-12-28 DIAGNOSIS — F1010 Alcohol abuse, uncomplicated: Secondary | ICD-10-CM

## 2024-12-28 DIAGNOSIS — E119 Type 2 diabetes mellitus without complications: Secondary | ICD-10-CM | POA: Diagnosis not present

## 2024-12-28 LAB — COMPREHENSIVE METABOLIC PANEL WITH GFR
ALT: 19 U/L (ref 0–44)
AST: 38 U/L (ref 15–41)
Albumin: 2.4 g/dL — ABNORMAL LOW (ref 3.5–5.0)
Alkaline Phosphatase: 78 U/L (ref 38–126)
Anion gap: 8 (ref 5–15)
BUN: 9 mg/dL (ref 6–20)
CO2: 33 mmol/L — ABNORMAL HIGH (ref 22–32)
Calcium: 7.4 mg/dL — ABNORMAL LOW (ref 8.9–10.3)
Chloride: 101 mmol/L (ref 98–111)
Creatinine, Ser: 0.83 mg/dL (ref 0.61–1.24)
GFR, Estimated: >60 mL/min
Glucose, Bld: 131 mg/dL — ABNORMAL HIGH (ref 70–99)
Potassium: 2.5 mmol/L — CL (ref 3.5–5.1)
Sodium: 142 mmol/L (ref 135–145)
Total Bilirubin: 1.4 mg/dL — ABNORMAL HIGH (ref 0.0–1.2)
Total Protein: 5.9 g/dL — ABNORMAL LOW (ref 6.5–8.1)

## 2024-12-28 LAB — URINE DRUG SCREEN
Amphetamines: NEGATIVE
Barbiturates: NEGATIVE
Benzodiazepines: POSITIVE — AB
Cocaine: NEGATIVE
Fentanyl: NEGATIVE
Methadone Scn, Ur: NEGATIVE
Opiates: NEGATIVE
Tetrahydrocannabinol: NEGATIVE

## 2024-12-28 LAB — CBC WITH DIFFERENTIAL/PLATELET
Abs Immature Granulocytes: 0.03 10*3/uL (ref 0.00–0.07)
Basophils Absolute: 0 10*3/uL (ref 0.0–0.1)
Basophils Relative: 0 %
Eosinophils Absolute: 0.2 10*3/uL (ref 0.0–0.5)
Eosinophils Relative: 2 %
HCT: 24.5 % — ABNORMAL LOW (ref 39.0–52.0)
Hemoglobin: 8.6 g/dL — ABNORMAL LOW (ref 13.0–17.0)
Immature Granulocytes: 0 %
Lymphocytes Relative: 10 %
Lymphs Abs: 0.9 10*3/uL (ref 0.7–4.0)
MCH: 35.4 pg — ABNORMAL HIGH (ref 26.0–34.0)
MCHC: 35.1 g/dL (ref 30.0–36.0)
MCV: 100.8 fL — ABNORMAL HIGH (ref 80.0–100.0)
Monocytes Absolute: 0.6 10*3/uL (ref 0.1–1.0)
Monocytes Relative: 6 %
Neutro Abs: 7.1 10*3/uL (ref 1.7–7.7)
Neutrophils Relative %: 82 %
Platelets: 209 10*3/uL (ref 150–400)
RBC: 2.43 MIL/uL — ABNORMAL LOW (ref 4.22–5.81)
RDW: 14.3 % (ref 11.5–15.5)
WBC: 8.8 10*3/uL (ref 4.0–10.5)
nRBC: 0 % (ref 0.0–0.2)

## 2024-12-28 LAB — ECHOCARDIOGRAM COMPLETE
Height: 66 in
S' Lateral: 2.6 cm
Weight: 2959.46 [oz_av]

## 2024-12-28 LAB — POTASSIUM
Potassium: 2.6 mmol/L — CL (ref 3.5–5.1)
Potassium: 2.8 mmol/L — ABNORMAL LOW (ref 3.5–5.1)

## 2024-12-28 LAB — GLUCOSE, CAPILLARY
Glucose-Capillary: 127 mg/dL — ABNORMAL HIGH (ref 70–99)
Glucose-Capillary: 123 mg/dL — ABNORMAL HIGH (ref 70–99)
Glucose-Capillary: 138 mg/dL — ABNORMAL HIGH (ref 70–99)
Glucose-Capillary: 121 mg/dL — ABNORMAL HIGH (ref 70–99)
Glucose-Capillary: 204 mg/dL — ABNORMAL HIGH (ref 70–99)
Glucose-Capillary: 171 mg/dL — ABNORMAL HIGH (ref 70–99)
Glucose-Capillary: 182 mg/dL — ABNORMAL HIGH (ref 70–99)

## 2024-12-28 LAB — LIPID PANEL
Cholesterol: 67 mg/dL (ref 0–200)
HDL: 26 mg/dL — ABNORMAL LOW
LDL Cholesterol: 24 mg/dL (ref 0–99)
Total CHOL/HDL Ratio: 2.6 ratio
Triglycerides: 87 mg/dL
VLDL: 17 mg/dL (ref 0–40)

## 2024-12-28 LAB — MAGNESIUM: Magnesium: 1.6 mg/dL — ABNORMAL LOW (ref 1.7–2.4)

## 2024-12-28 MED ORDER — CLOPIDOGREL BISULFATE 75 MG PO TABS
75.0000 mg | ORAL_TABLET | Freq: Every day | ORAL | Status: AC
  Administered 2024-12-28 – 2025-01-01 (×5): 75 mg via ORAL
  Filled 2024-12-28 (×5): qty 1

## 2024-12-28 MED ORDER — POTASSIUM CHLORIDE 10 MEQ/100ML IV SOLN
10.0000 meq | INTRAVENOUS | Status: AC
  Administered 2024-12-28 (×4): 10 meq via INTRAVENOUS
  Filled 2024-12-28 (×4): qty 100

## 2024-12-28 MED ORDER — ASPIRIN 81 MG PO TBEC
81.0000 mg | DELAYED_RELEASE_TABLET | Freq: Every day | ORAL | Status: AC
  Administered 2024-12-29 – 2025-01-01 (×4): 81 mg via ORAL
  Filled 2024-12-28 (×4): qty 1

## 2024-12-28 MED ORDER — MAGNESIUM SULFATE 2 GM/50ML IV SOLN
2.0000 g | Freq: Once | INTRAVENOUS | Status: AC
  Administered 2024-12-28: 2 g via INTRAVENOUS
  Filled 2024-12-28: qty 50

## 2024-12-28 NOTE — Plan of Care (Signed)

## 2024-12-28 NOTE — NC FL2 (Signed)
 " [place]  MEDICAID FL2 LEVEL OF CARE FORM     IDENTIFICATION  Patient Name: Adrian Schultz Birthdate: 02-13-1966 Sex: male Admission Date (Current Location): 12/25/2024  [facility] and [place] Number:  Producer, Television/film/video and Address:  [facility],  [address] [place] [address]      Provider Number: [id number]  Attending Physician Name and Address:  Royal Sill, MD  Relative Name and Phone Number:       Current Level of Care: Hospital Recommended Level of Care: Skilled Nursing Facility Prior Approval Number:    Date Approved/Denied:   PASRR Number: Pending  Discharge Plan: SNF    Current Diagnoses: Patient Active Problem List   Diagnosis Date Noted   Hypokalemia 12/25/2024   Unintentional weight loss 02/20/2022   Psychosis (HCC) 08/12/2019   Microalbuminuria 01/30/2019   Alcohol use disorder, severe, in early remission (HCC) 12/05/2018   Chronic pain of left knee 05/08/2018   Primary localized osteoarthritis of left knee 12/27/2017   Cervicalgia 12/27/2017   Anxiety 12/27/2017   History of panic attacks 12/27/2017   Chronic right shoulder pain 12/27/2017   Essential hypertension 10/04/2017   Alcohol abuse with alcohol-induced mood disorder (HCC) 09/06/2017   Hypercholesteremia 07/04/2017   Type 2 diabetes mellitus without complication, without long-term current use of insulin  (HCC) 04/05/2017    Orientation RESPIRATION BLADDER Height & Weight     Self, Place, Situation  Normal Incontinent Weight: 83.9 kg Height:  5' 6 (167.6 cm)  BEHAVIORAL SYMPTOMS/MOOD NEUROLOGICAL BOWEL NUTRITION STATUS      Incontinent Diet  AMBULATORY STATUS COMMUNICATION OF NEEDS Skin   Extensive Assist Verbally Normal                       Personal Care Assistance Level of Assistance  Bathing, Feeding, Dressing Bathing Assistance: Limited assistance Feeding assistance: Limited assistance Dressing Assistance: Limited assistance     Functional  Limitations Info  Sight, Hearing, Speech Sight Info: Impaired Financial Trader) Hearing Info: Adequate Speech Info: Adequate    SPECIAL CARE FACTORS FREQUENCY  PT (By licensed PT), OT (By licensed OT)     PT Frequency: 5x/wk OT Frequency: 5x/wk            Contractures Contractures Info: Not present    Additional Factors Info  Code Status, Allergies Code Status Info: FULL Allergies Info: Lisinopril , Buspar  (Buspirone )           Current Medications (12/28/2024):  This is the current hospital active medication list Current Facility-Administered Medications  Medication Dose Route Frequency Provider Last Rate Last Admin   acetaminophen  (TYLENOL ) tablet 650 mg  650 mg Oral Q6H PRN Zella, Mir M, MD       Or   acetaminophen  (TYLENOL ) suppository 650 mg  650 mg Rectal Q6H PRN Zella, Mir M, MD       albuterol  (PROVENTIL ) (2.5 MG/3ML) 0.083% nebulizer solution 2.5 mg  2.5 mg Nebulization Q2H PRN Zella, Mir M, MD       aspirin  EC tablet 81 mg  81 mg Oral Daily Jerri Pfeiffer, MD       Chlorhexidine  Gluconate Cloth 2 % PADS 6 each  6 each Topical Daily Zella, Mir M, MD   6 each at 12/28/24 1019   clopidogrel  (PLAVIX ) tablet 75 mg  75 mg Oral Daily Samtani, Jai-Gurmukh, MD   75 mg at 12/28/24 1053   enoxaparin  (LOVENOX ) injection 40 mg  40 mg Subcutaneous Q24H Zella Katha HERO, MD  folic acid  injection 1 mg  1 mg Intravenous Daily Armenta Canning, MD   1 mg at 12/28/24 9188   insulin  aspart (novoLOG ) injection 0-15 Units  0-15 Units Subcutaneous TID WC Zella, Mir M, MD   5 Units at 12/28/24 1242   insulin  aspart (novoLOG ) injection 0-5 Units  0-5 Units Subcutaneous QHS Zella, Mir M, MD       nicotine  (NICODERM CQ  - dosed in mg/24 hours) patch 14 mg  14 mg Transdermal Daily Samtani, Jai-Gurmukh, MD   14 mg at 12/28/24 9187   ondansetron  (ZOFRAN ) tablet 4 mg  4 mg Oral Q6H PRN Zella Katha HERO, MD       Or   ondansetron  (ZOFRAN ) injection 4 mg  4 mg Intravenous  Q6H PRN Zella, Mir M, MD       pantoprazole  (PROTONIX ) injection 40 mg  40 mg Intravenous Daily Zella, Mir M, MD   40 mg at 12/28/24 9188     Discharge Medications: Please see discharge summary for a list of discharge medications.  Relevant Imaging Results:  Relevant Lab Results:   Additional Information SSN: [id number]  Jon ONEIDA Anon, RN     "

## 2024-12-28 NOTE — Plan of Care (Signed)
  Problem: Pain Managment: Goal: General experience of comfort will improve and/or be controlled Outcome: Progressing   Problem: Safety: Goal: Ability to remain free from injury will improve Outcome: Progressing   Problem: Skin Integrity: Goal: Risk for impaired skin integrity will decrease Outcome: Progressing   Problem: Education: Goal: Knowledge of disease or condition will improve Outcome: Progressing

## 2024-12-28 NOTE — Progress Notes (Signed)
 Carotid artery duplex and bilateral lower extremity venous duplex has been completed. Preliminary results can be found in CV Proc through chart review.   12/28/24 9:29 AM Cathlyn Collet RVT

## 2024-12-28 NOTE — Progress Notes (Signed)
" °  Echocardiogram 2D Echocardiogram has been performed.  Tinnie FORBES Gosling RDCS 12/28/2024, 9:56 AM "

## 2024-12-28 NOTE — TOC CM/SW Note (Cosign Needed)
 30 Day PASRR Note   Patient Details  Name: Adrian Schultz Date of Birth: January 30, 1966   Transition of Care [facility]) CM/SW Contact:    Jon ONEIDA Anon, RN Phone Number: 12/28/2024, 4:25 PM  To Whom It May Concern:  Please be advised that this patient will require a short-term nursing home stay - anticipated 30 days or less for rehabilitation and strengthening.   The plan is for return home.

## 2024-12-28 NOTE — Progress Notes (Signed)
 [facility]  Adrian Schultz FMW:[id number]  DOB: [birth year]  DOA: 12/25/2024  PCP: Patient, No Pcp Per  12/28/2024,8:25 AM  LOS: 2 days    Code Status: Full code     from: Home  59 year old male Previous psychiatric admission alcohol abuse disorder psychosis in 2020 Diabetes mellitus type 2 with neuropathy, HTN HLD chronic right shoulder pain as well as cervical radiculopathy 1/30 brought in from home weakness-left lower leg with feeling of passing out no palpitations chest pain quit drinking 4 weeks ago associated with intermittent nausea vomiting and diarrhea Sodium 137 potassium less than 2 BUN/creatinine 18/1.2 calcium  7 anion gap 18-LFTs normal-high-sensitivity troponin 111 lactic acid 2, WBC 13.6 hemoglobin 10.4 platelet 224 CXR no acute cardiopulmonary findings 2/1 became more altered-imaging MRI = right frontal and high left frontal lobe infarcts, remote infarct left corona radiata anterior right frontal lobe extensive periventricular white matter changes-suggestive of small strokes-CT scan showed calcific plaque carotid siphons less than 50% stenosis near occlusive stenosis which is chronic 90% in the right internal carotid -telemetry neuro consulted started aspirin  Plavix    Assessment  & Plan :    Weakness\hypokalemia/AKI on admit probably from diarrhea/previous drinking Initially weakness?  2/2 severe electrolyte abnormalities-artifactual hyperkalemia still hypokalemic no Kayexalate was given Continues to be hypokalemic-giving 4 more runs of potassium-magnesium  ordered but pending and we will replace aggressively if still having diarrhea Continue to check labs but changed to daily including magnesium  and calcium  I have stopped his fluids as he is is net positive about 1000 cc and looks like he is breathing a little bit more heavily but do not think he is overloaded-we will check an x-ray in the morning  Diarrheal illness reported dark stool Previously and hospital stay noted to have a ring of red  around the area toileting-today I have noticed that he has brown stool which is watery Notified nursing that if he has more than 3 stools with diarrhea we need to check C. difficile/Pathogen panel although this may be related to nonspecific diarrhea and given no white count fever chills this may not be infectious  Code stroke called 2/1-new small strokes acute white matter right frontal high left frontal lobe on MRI Quite lethargic 2/1 now more awake coherent --- he seems to be sleepier in the morning and is occasionally joking and pretending to be confused as per below Transferring to [facility] for further stroke workup-he intentionally pockets liquids and meds--this is behavioral and not related to his stroke-I will graduate his diet as I do believe that he can eat and he is conversant SLP to see to make determination if otherwise as part of stroke workup Further workup as per stroke service including echo which is pending--LDL 24 triglycerides 87 total cholesterol is 6.75 he probably does not need a statin Continue aspirin  325, Plavix  75 and await determination of further management per neurology service  DM TY 2 A1c 6.2--Home meds Lantus  10 Lyrica  50 twice daily but not taking apparently at home CBGs ranging from 0100-0131-advance diet as above   HTN HLD  supposed to be on statin, ACE but is not taking   artifactual leukocytosis anemia  likely secondary to volume depletion and poor po leukocytosis is improved trend only at this time  recently quit alcohol 1/31 was placed on CIWA protocol after information from nursing revealed that he occasionally drinks he had his last drink on Sunday I do not believe he is actively withdrawing I think that he may have had confusion  because of his strokes We will watch him closely  Data Reviewed today: Sodium 142 potassium 2.5 BUN/creatinine 9/0.8 LFTs normal bili slightly up 1.4 total cholesterol 67 HDL 26 LDL 24 triglyceride 87--WBC 8.8 hemoglobin 8.6  platelet 2 9  DVT prophylaxis: Lovenox   Dispo/Global plan: Inpatient pending resolution     Subjective:   Little bit difficult to arouse this morning but awakens When I ask him where he is he tells me he is at the hospital-tells me the president is Zachary Neighbors When he is on the phone with his daughter and I mentioned this to her he laughs and tells me that it is not Zachary Neighbors  and does correctly mention Trump No significant issues overnight-he is sitting in brown stool which is watery He has no chest pain He is able to raise his arms above his head has no focal deficits  Objective + exam Vitals:   12/28/24 0400 12/28/24 0600 12/28/24 0700 12/28/24 0800  BP: (!) 161/84  122/76 139/75  Pulse: 90 83 81 91  Resp: (!) 24 19 20  (!) 21  Temp:      TempSrc:      SpO2: 90% 100% 100% 97%  Weight:      Height:       Filed Weights   12/26/24 1850  Weight: 83.9 kg     Examination:  EOMI NCAT pupils reactive to light Tongue protrudes Both arms are equally strong with raising above head-I did not test for coordination Abdomen is soft no rebound no guarding S1-S2 seems to be in sinus rhythm no murmur no rub no gallop ROM is intact Power is 5/5 in major muscle groups in upper and lower bodies reflexes not tested    Scheduled Meds:  aspirin   300 mg Rectal Daily   Or   aspirin   325 mg Oral Daily   Chlorhexidine  Gluconate Cloth  6 each Topical Daily   clopidogrel   75 mg Oral Daily   enoxaparin  (LOVENOX ) injection  40 mg Subcutaneous Q24H   folic acid   1 mg Intravenous Daily   insulin  aspart  0-15 Units Subcutaneous TID WC   insulin  aspart  0-5 Units Subcutaneous QHS   nicotine   14 mg Transdermal Daily   pantoprazole  (PROTONIX ) IV  40 mg Intravenous Daily   Continuous Infusions:  potassium chloride  10 mEq (12/28/24 0814)   acetaminophen  **OR** acetaminophen , albuterol , ondansetron  **OR** ondansetron  (ZOFRAN ) IV   Adrian Staley Budzinski, MD [facility] 8:36  AM   Adrian Jaivian Battaglini, MD  [facility]

## 2024-12-29 DIAGNOSIS — F17220 Nicotine dependence, chewing tobacco, uncomplicated: Secondary | ICD-10-CM | POA: Diagnosis not present

## 2024-12-29 DIAGNOSIS — E119 Type 2 diabetes mellitus without complications: Secondary | ICD-10-CM | POA: Diagnosis not present

## 2024-12-29 DIAGNOSIS — I6340 Cerebral infarction due to embolism of unspecified cerebral artery: Secondary | ICD-10-CM

## 2024-12-29 DIAGNOSIS — E876 Hypokalemia: Secondary | ICD-10-CM | POA: Diagnosis not present

## 2024-12-29 DIAGNOSIS — F1010 Alcohol abuse, uncomplicated: Secondary | ICD-10-CM | POA: Diagnosis not present

## 2024-12-29 DIAGNOSIS — I10 Essential (primary) hypertension: Secondary | ICD-10-CM | POA: Diagnosis not present

## 2024-12-29 DIAGNOSIS — I7090 Unspecified atherosclerosis: Secondary | ICD-10-CM | POA: Diagnosis not present

## 2024-12-29 LAB — COMPREHENSIVE METABOLIC PANEL WITH GFR
ALT: 18 U/L (ref 0–44)
AST: 34 U/L (ref 15–41)
Albumin: 2.5 g/dL — ABNORMAL LOW (ref 3.5–5.0)
Alkaline Phosphatase: 91 U/L (ref 38–126)
Anion gap: 9 (ref 5–15)
BUN: 10 mg/dL (ref 6–20)
CO2: 31 mmol/L (ref 22–32)
Calcium: 7.5 mg/dL — ABNORMAL LOW (ref 8.9–10.3)
Chloride: 99 mmol/L (ref 98–111)
Creatinine, Ser: 0.82 mg/dL (ref 0.61–1.24)
GFR, Estimated: >60 mL/min
Glucose, Bld: 160 mg/dL — ABNORMAL HIGH (ref 70–99)
Potassium: 2.4 mmol/L — CL (ref 3.5–5.1)
Sodium: 139 mmol/L (ref 135–145)
Total Bilirubin: 1.1 mg/dL (ref 0.0–1.2)
Total Protein: 6.1 g/dL — ABNORMAL LOW (ref 6.5–8.1)

## 2024-12-29 LAB — CBC WITH DIFFERENTIAL/PLATELET
Abs Immature Granulocytes: 0.06 10*3/uL (ref 0.00–0.07)
Basophils Absolute: 0 10*3/uL (ref 0.0–0.1)
Basophils Relative: 0 %
Eosinophils Absolute: 0.1 10*3/uL (ref 0.0–0.5)
Eosinophils Relative: 2 %
HCT: 23.9 % — ABNORMAL LOW (ref 39.0–52.0)
Hemoglobin: 8.2 g/dL — ABNORMAL LOW (ref 13.0–17.0)
Immature Granulocytes: 1 %
Lymphocytes Relative: 19 %
Lymphs Abs: 1.4 10*3/uL (ref 0.7–4.0)
MCH: 34.7 pg — ABNORMAL HIGH (ref 26.0–34.0)
MCHC: 34.3 g/dL (ref 30.0–36.0)
MCV: 101.3 fL — ABNORMAL HIGH (ref 80.0–100.0)
Monocytes Absolute: 0.5 10*3/uL (ref 0.1–1.0)
Monocytes Relative: 8 %
Neutro Abs: 5 10*3/uL (ref 1.7–7.7)
Neutrophils Relative %: 70 %
Platelets: 201 10*3/uL (ref 150–400)
RBC: 2.36 MIL/uL — ABNORMAL LOW (ref 4.22–5.81)
RDW: 14.5 % (ref 11.5–15.5)
WBC: 7.1 10*3/uL (ref 4.0–10.5)
nRBC: 0 % (ref 0.0–0.2)

## 2024-12-29 LAB — GLUCOSE, CAPILLARY
Glucose-Capillary: 156 mg/dL — ABNORMAL HIGH (ref 70–99)
Glucose-Capillary: 160 mg/dL — ABNORMAL HIGH (ref 70–99)
Glucose-Capillary: 164 mg/dL — ABNORMAL HIGH (ref 70–99)
Glucose-Capillary: 176 mg/dL — ABNORMAL HIGH (ref 70–99)
Glucose-Capillary: 231 mg/dL — ABNORMAL HIGH (ref 70–99)
Glucose-Capillary: 246 mg/dL — ABNORMAL HIGH (ref 70–99)

## 2024-12-29 LAB — FOLATE: Folate: 7.3 ng/mL

## 2024-12-29 LAB — AMMONIA: Ammonia: 33 umol/L (ref 9–35)

## 2024-12-29 LAB — VITAMIN B12: Vitamin B-12: 816 pg/mL (ref 180–914)

## 2024-12-29 LAB — MAGNESIUM: Magnesium: 1.8 mg/dL (ref 1.7–2.4)

## 2024-12-29 LAB — SYPHILIS: RPR W/REFLEX TO RPR TITER AND TREPONEMAL ANTIBODIES, TRADITIONAL SCREENING AND DIAGNOSIS ALGORITHM: RPR Ser Ql: NONREACTIVE

## 2024-12-29 MED ORDER — POTASSIUM CHLORIDE CRYS ER 20 MEQ PO TBCR
40.0000 meq | EXTENDED_RELEASE_TABLET | ORAL | Status: AC
  Administered 2024-12-29 (×2): 40 meq via ORAL
  Filled 2024-12-29 (×2): qty 2

## 2024-12-29 NOTE — TOC PASRR Note (Signed)
 [facility] IP TOC PASRR NOTE  30 Day PASRR Note   Patient Details  Name: Adrian Schultz Date of Birth: 1966/03/09   Transition of Care [facility]) CM/SW Contact:    [name] Goodie, LCSW Phone Number: 12/29/2024, 11:07 AM  To Whom It May Concern:  Please be advised that this patient will require a short-term nursing home stay - anticipated 30 days or less for rehabilitation and strengthening.   The plan is for return home.

## 2024-12-29 NOTE — Progress Notes (Signed)
 Physical Therapy Treatment [name] MRN: [id number] DOB: 1966-06-08 Today's Date: 12/29/2024   History of Present Illness Adrian Schultz is a 59 y.o. male being admitted to the hospital with severe generalized weakness found to have hypomagnesemia, and hypokalemia,  on CIWA protocol.  Noted 12/27/24 to be less verbal and have right sided weakness.  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 This most likely  reflects the sequelae of chronic microvascular ischemia. As per nursing, awaiting transfer to [facility].    Adrian Schultz:tpuy medical history significant for diabetes, hypertension, hyperlipidemia and alcohol abuse who quit drinking several weeks ago    PT Comments  Patient alert, follows commands with incr time, and cooperative. Able to progress to sit EOB with min assist and use of rails/HOB elevated. Sit to stand to RW with min assist x1 rep. Attempted 2 more reps with pt unable to fully stand due to fatigue/weakness. Returned to supine with min assist and scooted to [facility] in trendelenburg with mod assist (pt using LUE to pull himself up towards pillow). Additional rolling to remove soiled pad and replace with clean pads. Continue to recommend post-acute inpatient therapies <3 hrs/day.    If plan is discharge home, recommend the following: A lot of help with bathing/dressing/bathroom;Direct supervision/assist for medications management;Assistance with cooking/housework;Help with stairs or ramp for entrance;Assist for transportation;Two people to help with walking and/or transfers   Can travel by private vehicle     No  Equipment Recommendations   (TBD)    Recommendations for Other Services       Precautions / Restrictions Precautions Precautions: Fall Restrictions Weight Bearing Restrictions  Per Provider Order: No     Mobility  Bed Mobility Overal bed mobility: Needs Assistance Bed Mobility: Rolling, Sidelying to Sit, Sit to Supine Rolling: Mod assist, Used rails Sidelying to sit: Min assist, Used rails, HOB elevated   Sit to supine: Min assist, Used rails   General bed mobility comments: initially he attempted supine to sit unsuccessfully; educated in rolling and side to sit (up from rt side) using HOB elevated 30 and rail; HOB flat for return to supine    Transfers Overall transfer level: Needs assistance Equipment used: Rolling walker (2 wheels) Transfers: Sit to/from Stand Sit to Stand: Min assist, Mod assist           General transfer comment: stood x 1 min assist; attempted 2nd, 3rd times with mod assist and unable to fully stand    Ambulation/Gait               General Gait Details: pt unable   Stairs             Wheelchair Mobility     Tilt Bed    Modified Rankin (Stroke Patients Only) Modified Rankin (Stroke Patients Only) Pre-Morbid Rankin Score: Slight disability Modified Rankin: Severe disability     Balance Overall balance assessment: Needs assistance Sitting-balance support: Bilateral upper extremity supported, Feet supported Sitting balance-Leahy Scale: Poor Sitting balance - Comments: uses bil UE support to maintain balance   Standing balance support: Bilateral upper extremity supported, During functional activity, Reliant on assistive device for balance Standing balance-Leahy Scale: Poor  Communication Communication Communication: Impaired Factors Affecting Communication: Reduced clarity of speech  Cognition Arousal: Alert Behavior During Therapy: Flat affect   PT - Cognitive impairments: No family/caregiver present to determine baseline                         Following commands: Impaired Following commands impaired: Follows one step commands inconsistently,  Follows one step commands with increased time    Cueing Cueing Techniques: Verbal cues, Gestural cues, Tactile cues  Exercises      General Comments General comments (skin integrity, edema, etc.): Noted pad soiled with BM on standing; pt uanble to stand again for pericare and switching pad; returned to supine and pad replaced with rolling. RN made aware      Pertinent Vitals/Pain Pain Assessment Pain Assessment: Faces Faces Pain Scale: Hurts little more Pain Location: neck Pain Descriptors / Indicators: Guarding, Grimacing Pain Intervention(s): Limited activity within patient's tolerance, Monitored during session, Repositioned    Home Living                          Prior Function            PT Goals (current goals can now be found in the care plan section) Acute Rehab PT Goals Patient Stated Goal: wants to walk Time For Goal Achievement: 01/11/25 Potential to Achieve Goals: Fair Progress towards PT goals: Progressing toward goals    Frequency    Min 2X/week      PT Plan      Co-evaluation              AM-PAC PT 6 Clicks Mobility   Outcome Measure  Help needed turning from your back to your side while in a flat bed without using bedrails?: A Lot Help needed moving from lying on your back to sitting on the side of a flat bed without using bedrails?: A Lot Help needed moving to and from a bed to a chair (including a wheelchair)?: Total Help needed standing up from a chair using your arms (e.g., wheelchair or bedside chair)?: Total Help needed to walk in hospital room?: Total Help needed climbing 3-5 steps with a railing? : Total 6 Click Score: 8    End of Session Equipment Utilized During Treatment: Gait belt Activity Tolerance: Patient limited by fatigue Patient left: in bed;with call bell/phone within reach;with bed alarm set (with OT) Nurse Communication: Mobility status;Other (comment) (needs pericare and gown change) PT Visit Diagnosis:  Unsteadiness on feet (R26.81);Muscle weakness (generalized) (M62.81);Repeated falls (R29.6);Difficulty in walking, not elsewhere classified (R26.2);Other symptoms and signs involving the nervous system (R29.898)     Time: 8941-8878 PT Time Calculation (min) (ACUTE ONLY): 23 min  Charges:    $Therapeutic Activity: 23-37 mins PT General Charges $$ ACUTE PT VISIT: 1 Visit                     .ly   Macario SQUIBB Victor Granados 12/29/2024, 11:38 AM

## 2024-12-29 NOTE — Plan of Care (Signed)
   Problem: Education: Goal: Ability to describe self-care measures that may prevent or decrease complications (Diabetes Survival Skills Education) will improve Outcome: Progressing   Problem: Metabolic: Goal: Ability to maintain appropriate glucose levels will improve Outcome: Progressing   Problem: Nutritional: Goal: Maintenance of adequate nutrition will improve Outcome: Progressing

## 2024-12-30 ENCOUNTER — Encounter (HOSPITAL_COMMUNITY): Payer: Self-pay | Admitting: Internal Medicine

## 2024-12-30 ENCOUNTER — Other Ambulatory Visit: Payer: Self-pay

## 2024-12-30 DIAGNOSIS — E876 Hypokalemia: Secondary | ICD-10-CM | POA: Diagnosis not present

## 2024-12-30 LAB — CBC WITH DIFFERENTIAL/PLATELET
Abs Immature Granulocytes: 0.05 10*3/uL (ref 0.00–0.07)
Abs Immature Granulocytes: 0.06 10*3/uL (ref 0.00–0.07)
Basophils Absolute: 0 10*3/uL (ref 0.0–0.1)
Basophils Absolute: 0 10*3/uL (ref 0.0–0.1)
Basophils Relative: 0 %
Basophils Relative: 0 %
Eosinophils Absolute: 0.2 10*3/uL (ref 0.0–0.5)
Eosinophils Absolute: 0.2 10*3/uL (ref 0.0–0.5)
Eosinophils Relative: 2 %
Eosinophils Relative: 2 %
HCT: 20 % — ABNORMAL LOW (ref 39.0–52.0)
HCT: 22.8 % — ABNORMAL LOW (ref 39.0–52.0)
Hemoglobin: 7 g/dL — ABNORMAL LOW (ref 13.0–17.0)
Hemoglobin: 7.9 g/dL — ABNORMAL LOW (ref 13.0–17.0)
Immature Granulocytes: 1 %
Immature Granulocytes: 1 %
Lymphocytes Relative: 17 %
Lymphocytes Relative: 14 %
Lymphs Abs: 1.2 10*3/uL (ref 0.7–4.0)
Lymphs Abs: 1.2 10*3/uL (ref 0.7–4.0)
MCH: 35.2 pg — ABNORMAL HIGH (ref 26.0–34.0)
MCH: 35.3 pg — ABNORMAL HIGH (ref 26.0–34.0)
MCHC: 35 g/dL (ref 30.0–36.0)
MCHC: 34.6 g/dL (ref 30.0–36.0)
MCV: 100.5 fL — ABNORMAL HIGH (ref 80.0–100.0)
MCV: 101.8 fL — ABNORMAL HIGH (ref 80.0–100.0)
Monocytes Absolute: 0.5 10*3/uL (ref 0.1–1.0)
Monocytes Absolute: 0.6 10*3/uL (ref 0.1–1.0)
Monocytes Relative: 7 %
Monocytes Relative: 6 %
Neutro Abs: 5.4 10*3/uL (ref 1.7–7.7)
Neutro Abs: 7 10*3/uL (ref 1.7–7.7)
Neutrophils Relative %: 73 %
Neutrophils Relative %: 77 %
Platelets: 192 10*3/uL (ref 150–400)
Platelets: 257 10*3/uL (ref 150–400)
RBC: 1.99 MIL/uL — ABNORMAL LOW (ref 4.22–5.81)
RBC: 2.24 MIL/uL — ABNORMAL LOW (ref 4.22–5.81)
RDW: 14.1 % (ref 11.5–15.5)
RDW: 14.3 % (ref 11.5–15.5)
WBC: 7.3 10*3/uL (ref 4.0–10.5)
WBC: 9 10*3/uL (ref 4.0–10.5)
nRBC: 0 % (ref 0.0–0.2)
nRBC: 0 % (ref 0.0–0.2)

## 2024-12-30 LAB — GLUCOSE, CAPILLARY
Glucose-Capillary: 177 mg/dL — ABNORMAL HIGH (ref 70–99)
Glucose-Capillary: 208 mg/dL — ABNORMAL HIGH (ref 70–99)
Glucose-Capillary: 170 mg/dL — ABNORMAL HIGH (ref 70–99)
Glucose-Capillary: 212 mg/dL — ABNORMAL HIGH (ref 70–99)

## 2024-12-30 LAB — IRON AND TIBC
Iron: 36 ug/dL — ABNORMAL LOW (ref 45–182)
Saturation Ratios: 27 % (ref 17.9–39.5)
TIBC: 133 ug/dL — ABNORMAL LOW (ref 250–450)
UIBC: 97 ug/dL

## 2024-12-30 LAB — MAGNESIUM: Magnesium: 1.4 mg/dL — ABNORMAL LOW (ref 1.7–2.4)

## 2024-12-30 LAB — BASIC METABOLIC PANEL WITH GFR
Anion gap: 6 (ref 5–15)
BUN: 9 mg/dL (ref 6–20)
CO2: 31 mmol/L (ref 22–32)
Calcium: 7.4 mg/dL — ABNORMAL LOW (ref 8.9–10.3)
Chloride: 100 mmol/L (ref 98–111)
Creatinine, Ser: 0.71 mg/dL (ref 0.61–1.24)
GFR, Estimated: >60 mL/min
Glucose, Bld: 136 mg/dL — ABNORMAL HIGH (ref 70–99)
Potassium: 2.8 mmol/L — ABNORMAL LOW (ref 3.5–5.1)
Sodium: 136 mmol/L (ref 135–145)

## 2024-12-30 LAB — FERRITIN: Ferritin: 1476 ng/mL — ABNORMAL HIGH (ref 24–336)

## 2024-12-30 LAB — RETICULOCYTES
RBC.: 1.97 MIL/uL — ABNORMAL LOW (ref 4.22–5.81)
Retic Ct Pct: <0.4 % — ABNORMAL LOW (ref 0.4–3.1)

## 2024-12-30 LAB — HOMOCYSTEINE: Homocysteine: 19.6 umol/L — ABNORMAL HIGH (ref 0.0–14.5)

## 2024-12-30 LAB — TRANSFERRIN: Transferrin: 95 mg/dL — ABNORMAL LOW (ref 180–329)

## 2024-12-30 MED ORDER — MAGNESIUM SULFATE 4 GM/100ML IV SOLN
4.0000 g | Freq: Once | INTRAVENOUS | Status: AC
  Administered 2024-12-30: 4 g via INTRAVENOUS
  Filled 2024-12-30: qty 100

## 2024-12-30 MED ORDER — POTASSIUM CHLORIDE CRYS ER 20 MEQ PO TBCR
40.0000 meq | EXTENDED_RELEASE_TABLET | ORAL | Status: AC
  Administered 2024-12-30 (×3): 40 meq via ORAL
  Filled 2024-12-30 (×3): qty 2

## 2024-12-30 NOTE — Progress Notes (Addendum)
 " PROGRESS NOTE    RALLY OUCH  FMW:[id number] DOB: [birth year] DOA: 12/25/2024 PCP: Patient, No Pcp Per   Brief Narrative:  Mr. Adrian Schultz is a 59 y.o. male with history of diabetes, hypertension, hyperlipidemia, alcohol abuse, chewing tobacco but claims that he quit drinking several weeks ago admitted for generalized weakness and lethargy.  Found to have profound hypokalemia and hypomagnesia.  He was admitted for those but then while hospitalized, also found to have right-sided weakness and decrease in speaking for which code stroke was called, patient was diagnosed with acute ischemic stroke.  Transferred from [facility] to [facility] for further stroke evaluation and assessment by neurology.  No TNK given due to being outside the window.  Details below.  Assessment & Plan:   Principal Problem:   Hypokalemia  Stroke:  bilateral punctate MCA/ACA watershed infarcts, etiology unclear, likely atherosclerosis, however cannot completely rule out cardioembolic source CTA head and neck right ICA 90% stenosis however Motion artifact obscures the level of stenosis.  Left ICA 30 to 50% stenosis.  Bilateral ICA 7 atherosclerosis.  However Carotid Doppler no significant stenosis bilateral ICA bulb. MRI small acute white matter infarct in the right frontal lobe and high left frontal lobe.  Remote infarct in the anterior left CR and anterior right frontal lobe. LE venous Doppler negative DVT Patient refused further TEE testing 2D Echo EF 60 to 65%, no PFO Recommend 30-day CardioNet monitor as outpatient to rule out A-fib, which has been arranged by cardiology LDL 24, no indication of statins. HgbA1c 6.2 Ba2/FA/RPR pending / Hypercoag labs pending UDS positive for benzo (hospital meds) No antithrombotic prior to admission, now on aspirin  81 mg daily and clopidogrel  75 mg daily DAPT for 3 weeks and then aspirin  alone. Patient counseled to be compliant with his antithrombotic  medications Therapy recommendations:  SNF,  Disposition: Pending, TOC on board.   Diabetes melitis type II: HgbA1c 6.2 goal < 7.0, controlled, Home meds include Lantus  10 units and Lyrica  50 twice daily but patient noncompliant.   Currently on SSI, blood sugar controlled.   Hypertension Stable, does not appear to be on any medications.  Alcohol abuse Patient uses Vodka, large amount, on CIWA protocol, minimal signs of withdrawal, has not required medications.  Continue multivitamin.   Tobacco abuse Patient chewing tobacco.  I have discussed tobacco cessation with the patient.  I have counseled the patient regarding the negative impacts of continued tobacco use including but not limited to lung cancer, COPD, and cardiovascular disease.  I have discussed alternatives to tobacco and modalities that may help facilitate tobacco cessation including but not limited to biofeedback, hypnosis, and medications.  Total time spent with tobacco counseling was 5 minutes.  Nicotine  patch.  Severe hypokalemia/hypomagnesemia: Persistent hypokalemia despite of aggressive replenishment, improved to 2.8 from 2.4 yesterday despite of giving him 120 mEq of KCl.  Will give him 3 more doses of 40 mEq.  Checking magnesium .  Acute microcytic anemia: Patient's last CBC is known from 3 years ago which shows normal hemoglobin.  Upon admission this time, hemoglobin was 10.4 which has drifted down and currently 7.0 with MCV of 100.5.  Likely folate deficiency in the setting of alcohol abuse.  Checking iron studies, FOBT, B12 and folate.  Repeating CBC later today, he is at the verge of requiring blood transfusion.  Transfuse if further drop.  DVT prophylaxis: enoxaparin  (LOVENOX ) injection 40 mg Start: 12/25/24 2200   Code Status: Full Code  Family  Communication:  None present at bedside.  Plan of care discussed with patient in length and he/she verbalized understanding and agreed with it.  Status is: Inpatient Remains  inpatient appropriate because: Dropping hemoglobin, otherwise appears stable.   Estimated body mass index is 29.71 kg/m as calculated from the following:   Height as of this encounter: 5' 6 (1.676 m).   Weight as of this encounter: 83.5 kg.    Nutritional Assessment: Body mass index is 29.71 kg/m.SABRA Seen by dietician.  I agree with the assessment and plan as outlined below: Nutrition Status:        . Skin Assessment: I have examined the patient's skin and I agree with the wound assessment as performed by the wound care RN as outlined below:    Consultants:  Neurology  Procedures:  None  Antimicrobials:  Anti-infectives (From admission, onward)    None         Subjective: Patient seen and examined.  He has no complaints.  I discussed with him about potential need of blood transfusion, he consented in case it is needed.  Objective: Vitals:   12/30/24 0029 12/30/24 0423 12/30/24 0729 12/30/24 0805  BP: 130/76 (!) 142/89 (!) 91/57 109/70  Pulse: 88 87 86 88  Resp: 18 17 16    Temp: 99.6 F (37.6 C) (!) 97.5 F (36.4 C) 98.7 F (37.1 C)   TempSrc: Oral Oral Oral   SpO2: 97% 99% 98%   Weight:      Height:        Intake/Output Summary (Last 24 hours) at 12/30/2024 0843 Last data filed at 12/30/2024 0425 Gross per 24 hour  Intake --  Output 1150 ml  Net -1150 ml   Filed Weights   12/26/24 1850 12/28/24 1730  Weight: 83.9 kg 83.5 kg    Examination:  General exam: Appears calm and comfortable  Respiratory system: Clear to auscultation. Respiratory effort normal. Cardiovascular system: S1 & S2 heard, RRR. No JVD, murmurs, rubs, gallops or clicks. No pedal edema. Gastrointestinal system: Abdomen is nondistended, soft and nontender. No organomegaly or masses felt. Normal bowel sounds heard. Central nervous system: Alert and oriented.  4/5 power in left lower extremity, according to him this is chronic. Skin: No rashes, lesions or ulcers Psychiatry: Judgement  and insight appear normal. Mood & affect appropriate.    Data Reviewed: I have personally reviewed following labs and imaging studies  CBC: Recent Labs  Lab 12/26/24 1426 12/27/24 1043 12/28/24 0246 12/29/24 0413 12/30/24 0234  WBC 7.1 7.8 8.8 7.1 7.3  NEUTROABS  --  6.4 7.1 5.0 5.4  HGB 8.7* 8.0* 8.6* 8.2* 7.0*  HCT 24.3* 22.5* 24.5* 23.9* 20.0*  MCV 98.4 100.9* 100.8* 101.3* 100.5*  PLT 141* 161 209 201 192   Basic Metabolic Panel: Recent Labs  Lab 12/25/24 1621 12/25/24 2102 12/26/24 0245 12/26/24 0249 12/26/24 1426 12/27/24 1043 12/27/24 1517 12/27/24 1835 12/28/24 0246 12/28/24 0938 12/28/24 1418 12/29/24 0413  NA  --    < >  --  138 136 140  --   --  142  --   --  139  K  --    < >  --  <2.0* 2.4* >7.5*   < > 2.8* 2.5* 2.6* 2.8* 2.4*  CL  --    < >  --  93* 96* 111  --   --  101  --   --  99  CO2  --    < >  --  35* 32 28  --   --  33*  --   --  31  GLUCOSE  --    < >  --  115* 140* 131*  --   --  131*  --   --  160*  BUN  --    < >  --  15 13 10   --   --  9  --   --  10  CREATININE  --    < >  --  0.99 0.93 0.78  --   --  0.83  --   --  0.82  CALCIUM   --    < >  --  6.6* 7.2* 6.3*  --   --  7.4*  --   --  7.5*  MG 1.2*   < >  --  1.7 2.9* 1.9  --   --   --  1.6*  --  1.8  PHOS 2.5  --  2.1*  --   --   --   --   --   --   --   --   --    < > = values in this interval not displayed.   GFR: Estimated Creatinine Clearance: 102 mL/min (by C-G formula based on SCr of 0.82 mg/dL). Liver Function Tests: Recent Labs  Lab 12/26/24 0249 12/26/24 1426 12/27/24 1043 12/28/24 0246 12/29/24 0413  AST 49* 47* 33 38 34  ALT 25 22 16 19 18   ALKPHOS 99 71 63 78 91  BILITOT 1.4* 1.1 1.1 1.4* 1.1  PROT 5.4* 5.5* 4.9* 5.9* 6.1*  ALBUMIN 2.3* 2.3* 2.1* 2.4* 2.5*   Recent Labs  Lab 12/25/24 1621  LIPASE 29   Recent Labs  Lab 12/25/24 1621 12/29/24 0413  AMMONIA 45* 33   Coagulation Profile: Recent Labs  Lab 12/25/24 1621  INR 1.1   Cardiac Enzymes: No  results for input(s): CKTOTAL, CKMB, CKMBINDEX, TROPONINI in the last 168 hours. BNP (last 3 results) No results for input(s): PROBNP in the last 8760 hours. HbA1C: No results for input(s): HGBA1C in the last 72 hours. CBG: Recent Labs  Lab 12/29/24 1131 12/29/24 1652 12/29/24 2042 12/29/24 2125 12/30/24 0617  GLUCAP 231* 176* 164* 156* 177*   Lipid Profile: Recent Labs    12/28/24 0246  CHOL 67  HDL 26*  LDLCALC 24  TRIG 87  CHOLHDL 2.6   Thyroid  Function Tests: No results for input(s): TSH, T4TOTAL, FREET4, T3FREE, THYROIDAB in the last 72 hours. Anemia Panel: Recent Labs    12/29/24 0413  VITAMINB12 816  FOLATE 7.3   Sepsis Labs: Recent Labs  Lab 12/25/24 1636 12/25/24 1817 12/25/24 2102  LATICACIDVEN 2.1* 1.7 1.1    No results found for this or any previous visit (from the past 240 hours).   Radiology Studies: DG CHEST PORT 1 VIEW Result Date: 12/28/2024 EXAM: 1 VIEW XRAY OF THE CHEST 12/28/2024 07:25:00 PM COMPARISON: 12/25/2024 CLINICAL HISTORY: [id number] Pneumonia. FINDINGS: LUNGS AND PLEURA: Low lung volumes. No focal consolidation. No pleural effusion. No pneumothorax. HEART AND MEDIASTINUM: No acute abnormality of the cardiac and mediastinal silhouettes. BONES AND SOFT TISSUES: Old healed left rib fracture. IMPRESSION: 1. No acute findings. 2. Low lung volumes. Electronically signed by: Greig Pique MD 12/28/2024 07:53 PM EST RP Workstation: [id number]   ECHOCARDIOGRAM COMPLETE Result Date: 12/28/2024    ECHOCARDIOGRAM REPORT   Patient Name:   Adrian Schultz [facility] Date of Exam: 12/28/2024 Medical Rec #:  [id number]    Height:  66.0 in Accession #:    [id number]   Weight:       185.0 lb Date of Birth:  06/28/1966   BSA:          1.934 m Patient Age:    58 years     BP:           123/57 mmHg Patient Gender: M            HR:           89 bpm. Exam Location:  Inpatient Procedure: 2D Echo, Color Doppler and Cardiac Doppler (Both Spectral and Color             Flow Doppler were utilized during procedure). Indications:    TIA G45.9  History:        Patient has no prior history of Echocardiogram examinations.                 Risk Factors:Diabetes and Hypertension.  Sonographer:    Tinnie Gosling RDCS Referring Phys: [phone] [facility]  Sonographer Comments: Suboptimal apical window. Image acquisition challenging due to respiratory motion. IMPRESSIONS  1. Left ventricular ejection fraction, by estimation, is 60 to 65%. The left ventricle has normal function. The left ventricle has no regional wall motion abnormalities. Left ventricular diastolic parameters are indeterminate.  2. Right ventricular systolic function is normal. The right ventricular size is mildly enlarged. Tricuspid regurgitation signal is inadequate for assessing PA pressure.  3. Left atrial size was not well visualized, appears normal.  4. Right atrial size was not well visualized, appears normal.  5. The mitral valve is normal in structure. No evidence of mitral valve regurgitation.  6. The aortic valve was not well visualized. Aortic valve regurgitation is not visualized. FINDINGS  Left Ventricle: Left ventricular ejection fraction, by estimation, is 60 to 65%. The left ventricle has normal function. The left ventricle has no regional wall motion abnormalities. The left ventricular internal cavity size was normal in size. There is  no left ventricular hypertrophy. Left ventricular diastolic parameters are indeterminate. Right Ventricle: The right ventricular size is mildly enlarged. Right vetricular wall thickness was not assessed. Right ventricular systolic function is normal. Tricuspid regurgitation signal is inadequate for assessing PA pressure. Left Atrium: Left atrial size was not well visualized, appears normal. Right Atrium: Right atrial size was not well visualized, appears normal. Pericardium: There is no evidence of pericardial effusion. The pericardial effusion is anterior to the  right ventricle. Presence of epicardial fat layer. Mitral Valve: The mitral valve is normal in structure. Mild mitral annular calcification. No evidence of mitral valve regurgitation. Tricuspid Valve: The tricuspid valve is not well visualized. Tricuspid valve regurgitation is trivial. Aortic Valve: The aortic valve was not well visualized. Aortic valve regurgitation is not visualized. Pulmonic Valve: The pulmonic valve was not well visualized. Pulmonic valve regurgitation is not visualized. Aorta: The aortic root was not well visualized, the ascending aorta was not well visualized and the aortic arch was not well visualized. Venous: The inferior vena cava was not well visualized. IAS/Shunts: The interatrial septum was not well visualized.  LEFT VENTRICLE PLAX 2D LVIDd:         4.00 cm LVIDs:         2.60 cm LV PW:         1.10 cm LV IVS:        1.00 cm LVOT diam:     2.10 cm LVOT Area:     3.46 cm  RIGHT VENTRICLE RV S prime:     3.81 cm/s LEFT ATRIUM         Index LA diam:    4.20 cm 2.17 cm/m   AORTA Ao Root diam: 3.88 cm Ao Asc diam:  3.50 cm  SHUNTS Systemic Diam: 2.10 cm Morene Brownie Electronically signed by Morene Brownie Signature Date/Time: 12/28/2024/12:57:53 PM    Final    VAS US  LOWER EXTREMITY VENOUS (DVT) Result Date: 12/28/2024  Lower Venous DVT Study Patient Name:  BARUC TUGWELL [facility]  Date of Exam:   12/28/2024 Medical Rec #: [id number]     Accession #:    [id number] Date of Birth: 31-Oct-1966    Patient Gender: M Patient Age:   67 years Exam Location:  [facility] Procedure:      VAS US  LOWER EXTREMITY VENOUS (DVT) Referring Phys: ARY XU --------------------------------------------------------------------------------  Indications: Stroke.  Risk Factors: None identified. Limitations: Poor ultrasound/tissue interface. Comparison Study: No prior studies. Performing Technologist: Cordella Collet RVT  Examination Guidelines: A complete evaluation includes B-mode imaging, spectral Doppler, color  Doppler, and power Doppler as needed of all accessible portions of each vessel. Bilateral testing is considered an integral part of a complete examination. Limited examinations for reoccurring indications may be performed as noted. The reflux portion of the exam is performed with the patient in reverse Trendelenburg.  +---------+---------------+---------+-----------+----------+--------------+ RIGHT    CompressibilityPhasicitySpontaneityPropertiesThrombus Aging +---------+---------------+---------+-----------+----------+--------------+ CFV      Full           Yes      Yes                                 +---------+---------------+---------+-----------+----------+--------------+ SFJ      Full                                                        +---------+---------------+---------+-----------+----------+--------------+ FV Prox  Full                                                        +---------+---------------+---------+-----------+----------+--------------+ FV Mid   Full                                                        +---------+---------------+---------+-----------+----------+--------------+ FV DistalFull                                                        +---------+---------------+---------+-----------+----------+--------------+ PFV      Full                                                        +---------+---------------+---------+-----------+----------+--------------+  POP      Full           Yes      Yes                                 +---------+---------------+---------+-----------+----------+--------------+ PTV      Full                                                        +---------+---------------+---------+-----------+----------+--------------+ PERO     Full                                                        +---------+---------------+---------+-----------+----------+--------------+    +---------+---------------+---------+-----------+----------+--------------+ LEFT     CompressibilityPhasicitySpontaneityPropertiesThrombus Aging +---------+---------------+---------+-----------+----------+--------------+ CFV      Full           Yes      Yes                                 +---------+---------------+---------+-----------+----------+--------------+ SFJ      Full                                                        +---------+---------------+---------+-----------+----------+--------------+ FV Prox  Full                                                        +---------+---------------+---------+-----------+----------+--------------+ FV Mid   Full                                                        +---------+---------------+---------+-----------+----------+--------------+ FV DistalFull                                                        +---------+---------------+---------+-----------+----------+--------------+ PFV      Full                                                        +---------+---------------+---------+-----------+----------+--------------+ POP      Full           Yes      Yes                                 +---------+---------------+---------+-----------+----------+--------------+  PTV      Full                                                        +---------+---------------+---------+-----------+----------+--------------+ PERO     Full                                                        +---------+---------------+---------+-----------+----------+--------------+     Summary: RIGHT: - There is no evidence of deep vein thrombosis in the lower extremity.  - No cystic structure found in the popliteal fossa.  LEFT: - There is no evidence of deep vein thrombosis in the lower extremity.  - No cystic structure found in the popliteal fossa.  *See table(s) above for measurements and observations. Electronically signed  by Gaile New MD on 12/28/2024 at 10:45:54 AM.    Final    VAS US  CAROTID Result Date: 12/28/2024 Carotid Arterial Duplex Study Patient Name:  LEXTON HIDALGO [facility]  Date of Exam:   12/28/2024 Medical Rec #: [id number]     Accession #:    [id number] Date of Birth: [birth year]    Patient Gender: M Patient Age:   56 years Exam Location:  [facility] Procedure:      VAS US  CAROTID Referring Phys: ARY CUMMINS --------------------------------------------------------------------------------  Indications:       CVA. Risk Factors:      Hypertension, Diabetes. Limitations        Today's exam was limited due to the patient's respiratory                    variation and patient positioning. Comparison Study:  No prior studies. Performing Technologist: Cordella Collet RVT  Examination Guidelines: A complete evaluation includes B-mode imaging, spectral Doppler, color Doppler, and power Doppler as needed of all accessible portions of each vessel. Bilateral testing is considered an integral part of a complete examination. Limited examinations for reoccurring indications may be performed as noted.  Right Carotid Findings: +----------+--------+--------+--------+-----------------------+--------+           PSV cm/sEDV cm/sStenosisPlaque Description     Comments +----------+--------+--------+--------+-----------------------+--------+ CCA Prox  87      23              smooth and heterogenous         +----------+--------+--------+--------+-----------------------+--------+ CCA Distal75      16              smooth and heterogenous         +----------+--------+--------+--------+-----------------------+--------+ ICA Prox  56      23              smooth and heterogenous         +----------+--------+--------+--------+-----------------------+--------+ ICA Mid   92      30                                              +----------+--------+--------+--------+-----------------------+--------+ ICA Distal82      31                                               +----------+--------+--------+--------+-----------------------+--------+  ECA       74                                                      +----------+--------+--------+--------+-----------------------+--------+ +----------+--------+-------+--------+-------------------+           PSV cm/sEDV cmsDescribeArm Pressure (mmHG) +----------+--------+-------+--------+-------------------+ [id number]                                         +----------+--------+-------+--------+-------------------+ +---------+--------+--+--------+--+---------+ VertebralPSV cm/s38EDV cm/s13Antegrade +---------+--------+--+--------+--+---------+  Left Carotid Findings: +----------+--------+--------+--------+-----------------------+--------+           PSV cm/sEDV cm/sStenosisPlaque Description     Comments +----------+--------+--------+--------+-----------------------+--------+ CCA Prox  118     27                                              +----------+--------+--------+--------+-----------------------+--------+ CCA Distal85      29              smooth and heterogenous         +----------+--------+--------+--------+-----------------------+--------+ ICA Prox  57      19              smooth and heterogenous         +----------+--------+--------+--------+-----------------------+--------+ ICA Mid   87      37                                              +----------+--------+--------+--------+-----------------------+--------+ ICA Distal57      26                                     tortuous +----------+--------+--------+--------+-----------------------+--------+ ECA       68      9                                               +----------+--------+--------+--------+-----------------------+--------+ +----------+--------+--------+--------+-------------------+           PSV cm/sEDV cm/sDescribeArm Pressure (mmHG)  +----------+--------+--------+--------+-------------------+ Dlarojcpjw23                                          +----------+--------+--------+--------+-------------------+ +---------+--------+--+--------+--+---------+ VertebralPSV cm/s49EDV cm/s27Antegrade +---------+--------+--+--------+--+---------+   Summary: Right Carotid: Velocities in the right ICA are consistent with a 1-39% stenosis. Left Carotid: Velocities in the left ICA are consistent with a 1-39% stenosis. Vertebrals: Bilateral vertebral arteries demonstrate antegrade flow. *See table(s) above for measurements and observations.     Preliminary     Scheduled Meds:  aspirin  EC  81 mg Oral Daily   clopidogrel   75 mg Oral Daily   enoxaparin  (LOVENOX ) injection  40 mg Subcutaneous Q24H   folic acid   1 mg Intravenous Daily   insulin  aspart  0-15 Units  Subcutaneous TID WC   insulin  aspart  0-5 Units Subcutaneous QHS   nicotine   14 mg Transdermal Daily   pantoprazole  (PROTONIX ) IV  40 mg Intravenous Daily   Continuous Infusions:   LOS: 4 days   Fredia Skeeter, MD [facility]  12/30/2024, 8:43 AM   *Please note that this is a verbal dictation therefore any spelling or grammatical errors are due to the Dragon Medical One system interpretation.  Please page via Amion and do not message via secure chat for urgent patient care matters. Secure chat can be used for non urgent patient care matters.  How to contact the [facility] Attending or Consulting provider 7A - 7P or covering provider during after hours 7P -7A, for this patient?  Check the care team in [facility] and look for a) attending/consulting [facility] provider listed and b) the [facility] team listed. Page or secure chat 7A-7P. Log into [email or web address] and use [place] universal password to access. If you do not have the password, please contact the hospital operator. Locate the [facility] provider you are looking for under [facility] and page to a number that you can be directly  reached. If you still have difficulty reaching the provider, please page the [facility] (Director on Call) for the Hospitalists listed on amion for assistance.  "

## 2024-12-30 NOTE — Progress Notes (Signed)
 Occupational Therapy Treatment [name] MRN: [id number] DOB: 05/16/1966 Today's Date: 12/30/2024   History of present illness Adrian Schultz is a 59 y.o. male being admitted to the hospital with severe generalized weakness found to have hypomagnesemia, and hypokalemia,  on CIWA protocol.  Noted 12/27/24 to be less verbal and have right sided weakness.  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 This most likely  reflects the sequelae of chronic microvascular ischemia. As per nursing, awaiting transfer to [facility]    EFY:tpuy medical history significant for diabetes, hypertension, hyperlipidemia and alcohol abuse who quit drinking several weeks ago   OT comments  Pt progressing well towards goals. Pt with improved BUE use from initial eval, noting generalized weakness, however able to use functionally. Pt transitioned to EOB, when discovered in BM. Pt reporting he was aware of BM, however unable to state reasoning for not alerting staff. Attempted to complete pericare in standing, pt standing <30 seconds before returning to sit, and ultimately supine d/t fatigue. Total assist for bed level pericare. Pt limited by decreased cog, strength, and balance. Continue to recommend <3 hours of skilled rehab daily to optimize independence levels. Will continue to follow acutely.       If plan is discharge home, recommend the following:  Two people to help with walking and/or transfers;Two people to help with bathing/dressing/bathroom;Assist for transportation;Help with stairs or ramp for entrance;Supervision due to cognitive status;Assistance with cooking/housework;Assistance with feeding   Equipment Recommendations  Other (comment) (Defer to next venue)       Precautions / Restrictions  Precautions Precautions: Fall Recall of Precautions/Restrictions: Impaired Restrictions Weight Bearing Restrictions Per Provider Order: No       Mobility Bed Mobility Overal bed mobility: Needs Assistance Bed Mobility: Rolling, Supine to Sit, Sit to Supine Rolling: Supervision, Used rails   Supine to sit: Supervision Sit to supine: Min assist   General bed mobility comments: HOB elevated for supine to sit, min assist to return BLEs to bed. Rolled in bed  for pericare with supervision    Transfers Overall transfer level: Needs assistance Equipment used: Rolling walker (2 wheels) Transfers: Sit to/from Stand Sit to Stand: Mod assist           General transfer comment: Mod assist to rise, sustained standing for <30 seconds before returning to sit d/t fatigue, then returning to supine     Balance Overall balance assessment: Needs assistance Sitting-balance support: Bilateral upper extremity supported, Feet supported Sitting balance-Leahy Scale: Poor     Standing balance support: Bilateral upper extremity supported, During functional activity, Reliant on assistive device for balance Standing balance-Leahy Scale: Poor Standing balance comment: Reliant on RW       ADL either performed or assessed with clinical judgement   ADL Overall ADL's : Needs assistance/impaired       Toileting- Clothing Manipulation and Hygiene: Total assistance;Bed level Toileting - Clothing Manipulation Details (indicate cue type and reason): Incontient of BM       General ADL Comments: Fatigue and cog limiting pt    Extremity/Trunk Assessment Upper Extremity Assessment Upper Extremity Assessment: Generalized weakness;Right hand dominant   Lower Extremity Assessment Lower Extremity Assessment: Defer to PT evaluation        Vision   Vision Assessment?: Wears glasses for reading;Wears glasses for driving;Vision impaired- to  be further tested in functional context Additional  Comments: TBA functionally         Communication Communication Communication: Impaired Factors Affecting Communication: Reduced clarity of speech   Cognition Arousal: Alert Behavior During Therapy: Flat affect Cognition: Cognition impaired   Orientation impairments: Time, Situation Awareness: Intellectual awareness impaired, Online awareness impaired Memory impairment (select all impairments): Short-term memory, Working memory Attention impairment (select first level of impairment): Selective attention Executive functioning impairment (select all impairments): Problem solving, Reasoning OT - Cognition Comments: Decreased insight and reasoning skills. Pt discovered soiled in BM, pt reporting he is aware, however did not alert nursing staff of BM                 Following commands: Impaired Following commands impaired: Follows one step commands inconsistently, Follows one step commands with increased time      Cueing   Cueing Techniques: Verbal cues, Gestural cues, Tactile cues        General Comments Soiled in BM, completed pericare at bed level    Pertinent Vitals/ Pain       Pain Assessment Pain Assessment: No/denies pain   Frequency  Min 2X/week        Progress Toward Goals  OT Goals(current goals can now be found in the care plan section)  Progress towards OT goals: Progressing toward goals  Acute Rehab OT Goals Patient Stated Goal: To lay down OT Goal Formulation: With patient Time For Goal Achievement: 01/11/25 Potential to Achieve Goals: Good ADL Goals Pt Will Perform Eating: with set-up;sitting;with adaptive utensils Pt Will Perform Grooming: with set-up;sitting;with adaptive equipment Pt Will Perform Upper Body Bathing: with supervision;sitting;with adaptive equipment Pt Will Perform Lower Body Bathing: with mod assist;sit to/from stand;with adaptive equipment Pt Will Perform Upper Body Dressing: with min assist;sitting Pt Will Perform Lower  Body Dressing: with mod assist;sit to/from stand;with adaptive equipment Pt Will Transfer to Toilet: with +2 assist;with mod assist;bedside commode Pt Will Perform Toileting - Clothing Manipulation and hygiene: with min assist;sitting/lateral leans Pt Will Perform Tub/Shower Transfer: Tub transfer;tub bench;rolling walker;with mod assist;Stand pivot transfer Pt/caregiver will Perform Home Exercise Program: Increased strength;Right Upper extremity;With theraputty;With written HEP provided;With minimal assist  Plan         AM-PAC OT 6 Clicks Daily Activity     Outcome Measure   Help from another person eating meals?: A Little Help from another person taking care of personal grooming?: A Little Help from another person toileting, which includes using toliet, bedpan, or urinal?: Total Help from another person bathing (including washing, rinsing, drying)?: A Lot Help from another person to put on and taking off regular upper body clothing?: A Lot Help from another person to put on and taking off regular lower body clothing?: A Lot 6 Click Score: 13    End of Session Equipment Utilized During Treatment: Rolling walker (2 wheels)  OT Visit Diagnosis: Muscle weakness (generalized) (M62.81);Cognitive communication deficit (R41.841);Unsteadiness on feet (R26.81);Other abnormalities of gait and mobility (R26.89)   Activity Tolerance Patient limited by fatigue   Patient Left in bed;with call bell/phone within reach;with bed alarm set   Nurse Communication Mobility status        Time: 8397-8377 OT Time Calculation (min): 20 min  Charges: OT General Charges $OT Visit: 1 Visit OT Treatments $Self Care/Home Management : 8-22 mins  Adrianne BROCKS, OT  Acute Rehabilitation Services Office [phone] Secure chat preferred   Adrianne GORMAN Savers 12/30/2024, 4:51 PM

## 2024-12-30 NOTE — Plan of Care (Signed)
" °  Problem: Nutritional: Goal: Maintenance of adequate nutrition will improve Outcome: Progressing Goal: Progress toward achieving an optimal weight will improve Outcome: Progressing   Problem: Skin Integrity: Goal: Risk for impaired skin integrity will decrease Outcome: Progressing   Problem: Clinical Measurements: Goal: Ability to maintain clinical measurements within normal limits will improve Outcome: Progressing Goal: Will remain free from infection Outcome: Progressing Goal: Diagnostic test results will improve Outcome: Progressing Goal: Respiratory complications will improve Outcome: Progressing Goal: Cardiovascular complication will be avoided Outcome: Progressing   Problem: Activity: Goal: Risk for activity intolerance will decrease Outcome: Progressing   Problem: Elimination: Goal: Will not experience complications related to bowel motility Outcome: Progressing Goal: Will not experience complications related to urinary retention Outcome: Progressing   Problem: Safety: Goal: Ability to remain free from injury will improve Outcome: Progressing   Problem: Skin Integrity: Goal: Risk for impaired skin integrity will decrease Outcome: Progressing   Problem: Pain Managment: Goal: General experience of comfort will improve and/or be controlled Outcome: Progressing   "

## 2024-12-31 ENCOUNTER — Other Ambulatory Visit: Payer: Self-pay | Admitting: Cardiology

## 2024-12-31 DIAGNOSIS — I639 Cerebral infarction, unspecified: Secondary | ICD-10-CM

## 2024-12-31 LAB — GLUCOSE, CAPILLARY
Glucose-Capillary: 208 mg/dL — ABNORMAL HIGH (ref 70–99)
Glucose-Capillary: 197 mg/dL — ABNORMAL HIGH (ref 70–99)
Glucose-Capillary: 164 mg/dL — ABNORMAL HIGH (ref 70–99)
Glucose-Capillary: 151 mg/dL — ABNORMAL HIGH (ref 70–99)
Glucose-Capillary: 112 mg/dL — ABNORMAL HIGH (ref 70–99)

## 2024-12-31 LAB — CBC WITH DIFFERENTIAL/PLATELET
Abs Immature Granulocytes: 0.05 10*3/uL (ref 0.00–0.07)
Basophils Absolute: 0 10*3/uL (ref 0.0–0.1)
Basophils Relative: 1 %
Eosinophils Absolute: 0.1 10*3/uL (ref 0.0–0.5)
Eosinophils Relative: 2 %
HCT: 23.4 % — ABNORMAL LOW (ref 39.0–52.0)
Hemoglobin: 8 g/dL — ABNORMAL LOW (ref 13.0–17.0)
Immature Granulocytes: 1 %
Lymphocytes Relative: 16 %
Lymphs Abs: 1.2 10*3/uL (ref 0.7–4.0)
MCH: 35.6 pg — ABNORMAL HIGH (ref 26.0–34.0)
MCHC: 34.2 g/dL (ref 30.0–36.0)
MCV: 104 fL — ABNORMAL HIGH (ref 80.0–100.0)
Monocytes Absolute: 0.5 10*3/uL (ref 0.1–1.0)
Monocytes Relative: 6 %
Neutro Abs: 5.6 10*3/uL (ref 1.7–7.7)
Neutrophils Relative %: 74 %
Platelets: 227 10*3/uL (ref 150–400)
RBC: 2.25 MIL/uL — ABNORMAL LOW (ref 4.22–5.81)
RDW: 14.5 % (ref 11.5–15.5)
WBC: 7.5 10*3/uL (ref 4.0–10.5)
nRBC: 0 % (ref 0.0–0.2)

## 2024-12-31 LAB — BASIC METABOLIC PANEL WITH GFR
Anion gap: 6 (ref 5–15)
BUN: 10 mg/dL (ref 6–20)
CO2: 31 mmol/L (ref 22–32)
Calcium: 7.7 mg/dL — ABNORMAL LOW (ref 8.9–10.3)
Chloride: 104 mmol/L (ref 98–111)
Creatinine, Ser: 0.86 mg/dL (ref 0.61–1.24)
GFR, Estimated: >60 mL/min
Glucose, Bld: 216 mg/dL — ABNORMAL HIGH (ref 70–99)
Potassium: 3.8 mmol/L (ref 3.5–5.1)
Sodium: 140 mmol/L (ref 135–145)

## 2024-12-31 LAB — FOLATE: Folate: 7 ng/mL

## 2024-12-31 LAB — CARDIOLIPIN ANTIBODIES, IGG, IGM, IGA
Anticardiolipin IgA: <9 U/mL (ref 0–11)
Anticardiolipin IgG: <9 GPL U/mL (ref 0–14)
Anticardiolipin IgM: 9 [MPL'U]/mL (ref 0–12)

## 2024-12-31 LAB — PTT-LA MIX: PTT-LA Mix: 45.3 s — ABNORMAL HIGH (ref 0.0–40.5)

## 2024-12-31 LAB — LUPUS ANTICOAGULANT PANEL
DRVVT: 53.4 s — ABNORMAL HIGH (ref 0.0–47.0)
PTT Lupus Anticoagulant: 50.2 s — ABNORMAL HIGH (ref 0.0–43.5)

## 2024-12-31 LAB — VITAMIN B12: Vitamin B-12: 688 pg/mL (ref 180–914)

## 2024-12-31 LAB — DRVVT CONFIRM: dRVVT Confirm: 1.1 ratio (ref 0.8–1.2)

## 2024-12-31 LAB — HEXAGONAL PHASE PHOSPHOLIPID: Hexagonal Phase Phospholipid: 9 s (ref 0–11)

## 2024-12-31 LAB — BETA-2-GLYCOPROTEIN I ABS, IGG/M/A
Beta-2 Glyco I IgG: <9 GPI IgG units (ref 0–20)
Beta-2-Glycoprotein I IgA: <9 GPI IgA units (ref 0–25)
Beta-2-Glycoprotein I IgM: <9 GPI IgM units (ref 0–32)

## 2024-12-31 LAB — OCCULT BLOOD X 1 CARD TO LAB, STOOL: Fecal Occult Bld: NEGATIVE

## 2024-12-31 LAB — DRVVT MIX: dRVVT Mix: 43.3 s — ABNORMAL HIGH (ref 0.0–40.4)

## 2024-12-31 MED ORDER — INSULIN GLARGINE-YFGN 100 UNIT/ML ~~LOC~~ SOLN
8.0000 [IU] | Freq: Every day | SUBCUTANEOUS | Status: AC
  Administered 2024-12-31 – 2025-01-01 (×2): 8 [IU] via SUBCUTANEOUS
  Filled 2024-12-31 (×4): qty 0.08

## 2024-12-31 MED ORDER — FOLIC ACID 1 MG PO TABS
1.0000 mg | ORAL_TABLET | Freq: Every day | ORAL | Status: AC
  Administered 2024-12-31 – 2025-01-01 (×2): 1 mg via ORAL
  Filled 2024-12-31 (×2): qty 1

## 2024-12-31 MED ORDER — FERROUS SULFATE 325 (65 FE) MG PO TABS
325.0000 mg | ORAL_TABLET | Freq: Every day | ORAL | Status: AC
  Administered 2025-01-01: 325 mg via ORAL
  Filled 2024-12-31 (×2): qty 1

## 2024-12-31 NOTE — Plan of Care (Signed)
   Problem: Skin Integrity: Goal: Risk for impaired skin integrity will decrease Outcome: Progressing

## 2024-12-31 NOTE — Progress Notes (Addendum)
 Physical Therapy Treatment Patient Details Name: JOHANN SANTONE MRN: [id number] DOB: 04/16/1966 Today's Date: 12/31/2024   History of Present Illness chaz mcglasson is a 59 y.o. male being admitted to the hospital with severe generalized weakness found to have hypomagnesemia, and hypokalemia,  on CIWA protocol.  Noted 12/27/24 to be less verbal and have right sided weakness.  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 This most likely  reflects the sequelae of chronic microvascular ischemia. As per nursing, awaiting transfer to [facility].    EFY:tpuy medical history significant for diabetes, hypertension, hyperlipidemia and alcohol abuse who quit drinking several weeks ago    PT Comments  Patient more confused today and responding very slowly verbally and motorically. Majority of session spent convincing pt to do some sort of activity/mobility. Ultimately did 2 RLE exercises x 5 reps each. Refused to sit EOB but did agree to move nearly to long-sitting (propped on Rt elbow) to reach something on his table that he wanted. With attempts to persuade pt to do additional activities, he began to get angry and PT ended session.     If plan is discharge home, recommend the following: A lot of help with bathing/dressing/bathroom;Direct supervision/assist for medications management;Assistance with cooking/housework;Help with stairs or ramp for entrance;Assist for transportation;Two people to help with walking and/or transfers   Can travel by private Theme [employer] (measurements PT);Wheelchair cushion (measurements PT);Hoyer lift;Hospital bed    Recommendations for Other Services       Precautions / Restrictions Precautions Precautions: Fall Recall of Precautions/Restrictions:  Impaired Precaution/Restrictions Comments: aspiration, R dom hemi Restrictions Weight Bearing Restrictions Per Provider Order: No     Mobility  Bed Mobility Overal bed mobility: Needs Assistance Bed Mobility: Rolling Rolling: Supervision, Used rails         General bed mobility comments: pt refused to sit EOB; did roll/prop on rt elbow to reach his glasses; refused further mobility    Transfers                        Ambulation/Gait                   Stairs             Wheelchair Mobility     Tilt Bed    Modified Rankin (Stroke Patients Only) Modified Rankin (Stroke Patients Only) Pre-Morbid Rankin Score: Slight disability Modified Rankin: Severe disability     Balance Overall balance assessment: Needs assistance     Sitting balance - Comments: pt refused                                    Communication Communication Communication: Impaired Factors Affecting Communication: Reduced clarity of speech  Cognition Arousal: Alert Behavior During Therapy: Flat affect   PT - Cognitive impairments: No family/caregiver present to determine baseline                       PT - Cognition Comments: O x 2 self, hospital; very slow to respond verbally or motorically Following commands: Impaired Following commands impaired: Follows one step commands inconsistently, Follows one step commands with increased time  Cueing Cueing Techniques: Verbal cues, Gestural cues, Tactile cues  Exercises General Exercises - Lower Extremity Ankle Circles/Pumps: AROM, Right, 5 reps, Supine Heel Slides: AAROM, Right, 5 reps, Supine    General Comments        Pertinent Vitals/Pain Pain Assessment Pain Assessment: No/denies pain    Home Living                          Prior Function            PT Goals (current goals can now be found in the care plan section) Acute Rehab PT Goals Patient Stated Goal: wants to  walk Time For Goal Achievement: 01/11/25 Potential to Achieve Goals: Fair Progress towards PT goals: Not progressing toward goals - comment (confused)    Frequency    Min 2X/week      PT Plan      Co-evaluation              AM-PAC PT 6 Clicks Mobility   Outcome Measure  Help needed turning from your back to your side while in a flat bed without using bedrails?: A Lot Help needed moving from lying on your back to sitting on the side of a flat bed without using bedrails?: A Lot Help needed moving to and from a bed to a chair (including a wheelchair)?: Total Help needed standing up from a chair using your arms (e.g., wheelchair or bedside chair)?: Total Help needed to walk in hospital room?: Total Help needed climbing 3-5 steps with a railing? : Total 6 Click Score: 8    End of Session   Activity Tolerance: Patient limited by fatigue;Treatment limited secondary to medical complications (Comment) (confusion) Patient left: in bed;with call bell/phone within reach;with bed alarm set Nurse Communication: Mobility status;Other (comment) (more confused than previous session) PT Visit Diagnosis: Unsteadiness on feet (R26.81);Muscle weakness (generalized) (M62.81);Repeated falls (R29.6);Difficulty in walking, not elsewhere classified (R26.2);Other symptoms and signs involving the nervous system (R29.898)     Time: 8442-8385 PT Time Calculation (min) (ACUTE ONLY): 17 min  Charges:    $Therapeutic Exercise: 8-22 mins PT General Charges $$ ACUTE PT VISIT: 1 Visit                      Macario RAMAN, PT Acute Rehabilitation Services  Office [phone]    Macario SHAUNNA Soja 12/31/2024, 4:27 PM

## 2024-12-31 NOTE — Plan of Care (Signed)
" °  Problem: Skin Integrity: Goal: Risk for impaired skin integrity will decrease Outcome: Progressing   Problem: Nutritional: Goal: Maintenance of adequate nutrition will improve Outcome: Progressing Goal: Progress toward achieving an optimal weight will improve Outcome: Progressing   Problem: Clinical Measurements: Goal: Ability to maintain clinical measurements within normal limits will improve Outcome: Progressing Goal: Will remain free from infection Outcome: Progressing Goal: Diagnostic test results will improve Outcome: Progressing Goal: Respiratory complications will improve Outcome: Progressing Goal: Cardiovascular complication will be avoided Outcome: Progressing   Problem: Activity: Goal: Risk for activity intolerance will decrease Outcome: Progressing   Problem: Elimination: Goal: Will not experience complications related to bowel motility Outcome: Progressing Goal: Will not experience complications related to urinary retention Outcome: Progressing   Problem: Pain Managment: Goal: General experience of comfort will improve and/or be controlled Outcome: Progressing   Problem: Safety: Goal: Ability to remain free from injury will improve Outcome: Progressing   Problem: Skin Integrity: Goal: Risk for impaired skin integrity will decrease Outcome: Progressing   "

## 2024-12-31 NOTE — Progress Notes (Signed)
 Ordering 30 day monitor, post stroke per neurology. Results to Dr. Floretta

## 2024-12-31 NOTE — Discharge Instructions (Signed)
 [employer] assistance programs Crisis assistance programs  -[facility]elessness Arts Development Officer. If you are experiencing homelessness in [place], [place] , your first point of contact should be Pensions Consultant. You can reach Coordinated Entry by calling (336) (854)[phone] or by emailing [email or web address] [email or web address].  Community access points: [employer] ([phone] [address], [place]) every Tuesday from 9am-10am. [facility] ([address] [place]) every Wednesday from 8am-9am.   -[place] Coordinated Re-entry Daniel Mcalpine: Dial 211 and request. Offers referrals to homeless shelters in the area.    -The [employer] 713-519-6301) offers several services to local families, as funding allows. The Emergency Assistance Program (EAP), which they administer, provides household goods, free food, clothing, and financial aid to people in need in the [place]  area. The EAP program does have some qualification, and counselors will interview clients for financial assistance by written referral only. Referrals need to be made by the Department of Social Services or by other EAP approved human services agencies or charities in the area.  -[facility] of [employer], which can be reached at 825-095-9034, offers emergency assistance programs for those in need of help, such as food, rent assistance, a soup kitchen, shelter, and clothing. They are based in [facility]  but provide a number of services to those that qualify for assistance.   [facility] Department of Social Services may be able to offer temporary financial assistance and cash grants for paying rent and utilities, Help may be provided for local county residents who may be experiencing personal crisis when other resources, including government programs, are not available. Call ([phone] [phone]  -High [employer] is a [employer] agency, The organization can offer emergency assistance for paying rent, caremark rx, utilities, food, household products and furniture. They offer extensive emergency and transitional housing for families, children and single women, and also run a Boy's and D[employer]. Thrift Shops, Secondary School Teacher, and other aid offered too. [address] [place], [place]  [address], [phone]  -[place] Low Income Energy Assistance Program -- This is offered for [facility] families. The federal government created [employer] Program provides a one-time cash grant payment to help eligible low-income families pay their electric and heating bills. [address] [place], [place]  [address], [phone]  -[place] Emergency Assistance -- A program offers emergency utility and rent funds for greater [place] area residents. The program can also provide counseling and referrals to charities and government programs. Also provides food and a free meal program that serves lunch Mondays - Saturdays and dinner seven days per week to individuals in the community. [address] [place], [place]  [address], [phone]  -[employer] - Offers affordable apartment and housing communities across      [place] and [place]. The low income and seniors can access public housing, rental assistance to qualified applicants, and apply for the section 8 rent subsidy program. Other programs include Chiropractor and Engineer, Maintenance. [address] [place], [place]  [address], dial ([phone])[phone].  -The [facility] provides transitional housing to veterans and the disabled. Clients will also access other services too, including assistance in applying for Disability, life skills classes, case management, and assistance in finding permanent housing. [address] [place], [place]  [place] [address], call ([phone]) [phone]  -[place] Transitional Housing through [facility] is for people who were just  evicted or that are formerly homeless. The non-profit will also help then gain self-sufficiency, find a home or apartment to live in, and also provides information on rent assistance when needed. Phone [phone]  -The [facility] helps low income, elderly, or disabled residents in seven counties in the [place] ([place], [place], [place], [place], [place], [place], [place], and [place]) save energy and reduce their utility bills by improving energy efficiency. Phone [phone].  -[employer] is located in the [place] [facility] in the [employer], [address], [place], [place] [address]. Parking is in the rear of the building. Phone: [phone]   General Email: info@gsohc .org  GHC provides free housing counseling assistance in locating affordable rental housing or housing with support services for families and individuals in crisis and the chronically homeless. We provide potential resources for other housing needs like utilities. Our trained counselors also work with clients on budgeting and financial literacy in effort to empower them to take control of their financial situations. [employer] collaborates with homeless service providers and other stakeholders as part of the [employer] COC (Continuum of Care). The (COC) is a regional/local planning body that coordinates housing and services funding for homeless families and individuals. The role of [facility] in the COC is through housing counseling to work with people we serve on diversion strategies for those that are at imminent risk of becoming homeless. We also work with the Coordinated Assessment/Entry Specialist who attempts to find temporary solutions and/or connects the people  to Housing First, Rapid Re-housing or transitional housing programs. Our Homelessness Prevention Housing Counselors meet with clients on business days (Monday-Fridays, except scheduled holidays) from 8:30 am to 4:30 pm.  Legal assistance for evictions, foreclosure, and more -If you need free legal advice on civil issues, such as foreclosures, evictions, electronics engineer, government programs, domestic issues and more, Landscape Architect of [facility]  [facility]) is a associate professor firm that provides free legal services and counsel to lower income people, seniors, disabled, and others, The goal is to ensure everyone has access to justice and fair representation. Call them at 714-338-2738.  [facility] for Housing and Community Studies can provide info about obtaining legal assistance with evictions. Phone [phone].  Data Processing Manager  The [employer], [employer]. offers job and dispensing optician. Resources are focused on helping students obtain the skills and experiences that are necessary to compete in today's challenging and tight job market. The non-profit faith-based community action agency offers internship trainings as well as classroom instruction. Classes are tailored to meet the needs of people in the [facility] region. [place], [place] [address], [phone]  Foreclosure prevention/Debt Services Family Services of the [employer] Credit Counseling Service inludes debt and foreclosure prevention programs for local families. This includes money management, financial advice, budget review and development of a written action plan with a pensions consultant to help solve specific individual financial problems. In addition, housing and mortgage counselors can also provide pre- and post-purchase homeownership counseling, default resolution counseling (to prevent foreclosure) and reverse mortgage counseling. A Debt Management Program allows  people and families with a high level of credit card or medical debt to consolidate and repay consumer debt and loans to creditors and rebuild positive credit ratings and scores. Contact (336) D7650557.  Community clinics in [place] -Health Department [facility]: [address] [place], [place], [address]. [phone].  -Health Department [place] [facility]: [phone], [facility], [address]. [phone].  -[facility] offers medical care through a group of doctors, pharmacies and other healthcare related agencies that offer services for low income, uninsured adults in [place]. Also offers adult Dental care and assistance with applying for an [employer]. Call [phone].   [facility]. This center provides low-cost health care to those without health insurance. Services offered include an onsite pharmacy. Phone [phone]. [address], [place].  -Medication Assistance Program serves as a link between pharmaceutical companies and patients to provide low cost or free prescription medications. This service is available for residents who meet certain income restrictions and have no insurance coverage. PLEASE CALL [phone] [place]) OR ([phone] ([place])  -One Step Further: Materials Engineer, The [employer] Support & Nutrition Program, [place]. Call [phone]/ [phone].  Food pantry and assistance -Urban Ministry-Food Bank: [address], [place] [address]. Phone [phone]  -[facility] Food Pantry: [address] [place], [place] [address]. [phone].  -Missionary Ministry: has the purpose of visiting the sick and shut-ins and provide for needs in the surrounding communities. Call (782) [phone]. Email: [email or web address] .com This program provides: Food box for seniors, Financial assistance, Food to meet basic  nutritional needs.  -Meals on Wheels with Senior Resources: [facility] residents age 24 and over who are homebound and unable to obtain and prepare a nutritious meal for themselves are eligible for this service. There may be a waiting list in certain parts of [facility] if the route in that area is full. If you are in [place] [facility] and [place] call [phone] to register. For all other areas call ([phone] to register.  -Greater Dietitian: https://findfood.bargaincontractor.si  TRANSPORTATION: -[employer] Department of Health: Call [facility] and [employer] at [phone] for details. attractionguides.es  -Access [place]: Access [place] is the [employer] Agency's shared-ride transportation service for eligible riders who have a disability that prevents them from riding the fixed route bus. Call ([phone]. Access [place] riders must pay a fare of $1.50 per trip, or may purchase a 10-ride punch card for $14.00 ($1.40 per ride) or a 40-ride punch card for $48.00 ($1.20 per ride).  -The Directv transportation service is provided for senior citizens (60+) who live independently within [place] city limits and are unable to drive or have limited access to transportation. Call [phone] to schedule an appointment.  -[place] Transportation: For Medicare or Medicaid recipients call [phone]?[facility] Amb[facility], wheelchair fleeta, and ambulatory quotes available.   MEDICAID TRANSPORTATION: -If you have a Medicaid blue card or pink card and have no other means for transportation to doctor's offices, clinics, dentists, hospitals, and other health related trip needs.  -Transportation services are available to all [place] locations. Trips to [facility] and [place] are provided in association with PART. -Services are provided between 6:00AM and 9:00PM  Monday-Friday. -Call [phone] to schedule a trip or request further information.   FLEEING VIOLENCE: -Family Services of the [facility]- 24/7 Crisis line [phone]) -[facility] Justice Centers: (336) 641-SAFE [phone])  [place] 2-1-1 is another useful way to locate resources in the community. Visit shedsizes.ch to find service information online. If you need additional assistance, 2-1-1 Referral Specialists are available 24 hours a day, every day by dialing 2-1-1 or 3648170251 from any phone. The call is free, confidential, and available in any language.  Affordable Housing Search http://www.nchousingsearch.org  DAY  [facility])   M-F 8a-3p [address] [place]  [place], [place] [address] [phone] Services include: laundry, barbering, support groups, case management, phone & computer access, showers, AA/NA mtgs, mental health/substance abuse nurse, job skills class, disability information, VA assistance, spiritual classes, etc. Winter Shelter available when temperatures are less than 32 degrees.   HOMELESS SHELTERS [facility] at [facility]- Call [phone] ext. 347 or ext. 336. Located at [address], [place], [place] [address]  [facility] Mens Shelter- Call [phone]. Located at [address] [place] [address].  [facility] [employer]. Call [phone]. Office located at [address], [place] [address].  [facility] Family Housing through [place] [phone].  [facility] Family Shelter- Call ([phone]. Located at [address] [place], [place], [place] [address].  Room at the Inn-For Pregnant mothers. Call [phone]. Located at [address] [place], [address].  [facility]-For men in [place]. Call [phone]. [facility]-Shelter in [place]. Call [phone].  Home of [employer] for [employer]  ([phone]. Office located at [address] [place], [address].  [employer] be agreeable to help with chores. Call [phone] ext. 5000.  Men's: [address] [place], [place] [address]. Women's: [facility]  [address] [place], [place] [address]  Crisis Services [facility] Mobile Crisis Management- [phone]  [facility] [address] [place], [place] [address]. Phone: [phone]

## 2024-12-31 NOTE — Inpatient Diabetes Management (Signed)
 Inpatient Diabetes Program Recommendations  AACE/ADA: New Consensus Statement on Inpatient Glycemic Control (2015)  Target Ranges:  Prepandial:   less than 140 mg/dL      Peak postprandial:   less than 180 mg/dL (1-2 hours)      Critically ill patients:  140 - 180 mg/dL   Lab Results  Component Value Date   GLUCAP 208 (H) 12/31/2024   HGBA1C 6.2 (H) 12/26/2024    Latest Reference Range & Units 12/30/24 06:17 12/30/24 11:42 12/30/24 16:52 12/30/24 21:44 12/31/24 06:18  Glucose-Capillary 70 - 99 mg/dL 822 (H) 791 (H) 829 (H) 212 (H) 208 (H)   Review of Glycemic Control  Diabetes history: DM2  Outpatient Diabetes medications:  Lantus  10 units daily   Current orders for Inpatient glycemic control:  Novolog  0-15 units TID + 0-5 units at bedtime   Inpatient Diabetes Program Recommendations:   Please consider:  - Add Glargine 8 units daily   Thanks,  Lavanda Search, RN, MSN, [facility]  Inpatient Diabetes Coordinator  Pager [phone] (8a-5p)

## 2024-12-31 NOTE — Progress Notes (Signed)
 PT Cancellation Note  [name] MRN: [id number] DOB: [birth year]   Cancelled Treatment:    Reason Eval/Treat Not Completed: Fatigue/lethargy limiting ability to participate  Patient immediately stating he cannot work with PT at this time. He is anxious/upset that he has gotten no sleep at all. Encouraged working with PT and then returning to bed and pt continued to insist he could not do anything. He was agreeable to PT trying to work with him later today, as schedules permit.   Macario RAMAN, PT Acute Rehabilitation Services  Office [phone]  Macario SHAUNNA Soja 12/31/2024, 10:29 AM

## 2024-12-31 NOTE — TOC Initial Note (Signed)
 Transition of Care [facility]) - Initial/Assessment Note    Patient Details  Name: Adrian Schultz MRN: [id number] Date of Birth: 06/09/1966  [facility]) CM/SW Contact:    Adrian CHRISTELLA Goodie, Adrian Schultz Phone Number: 12/31/2024, 3:27 PM  Clinical Narrative:     CSW met with patient to discuss SNF placement, but patient not fully oriented, had difficulty answering questions. CSW asked about family support. Patient initially reported no family, but then told CSW to speak with his Adrian Schultz. CSW asked patient to find phone number for Adrian Schultz, but patient provided phone number for Adrian Schultz. When CSW asked about Schultz, patient said that she was dead. CSW asked who Adrian Schultz was, and patient couldn't say, just someone who can help.  CSW spoke with Adrian Schultz, who is patient's aunt. Adrian Schultz reported that patient has two daughters, one in [place] and one in [place], but she only has the contact number for [place] in [place]. Adrian Schultz in agreement with SNF and to help the patient, but said it should really be his daughters responsibility. CSW spoke with Adrian Schultz, she is willing to help with SNF placement. CSW explained only bed offer at [facility] Adrian Schultz is in agreement and will assist with paperwork. CSW updated [place] with Adrian Schultz's contact information.  CSW received call from [facility] about patient's PASRR request. CSW answered questions. Patient to be assigned a PASRR number.   CSW to follow.   Expected Discharge Plan: Skilled Nursing Facility Barriers to Discharge: Homeless with medical needs, Continued Medical Work up, English As A Second Language Teacher, Inadequate or no insurance   Patient Goals and CMS Choice Patient states their goals for this hospitalization and ongoing recovery are:: patient unable to participate in goal setting, not fully oriented CMS Medicare.gov Compare Post Acute Care list provided to:: Patient Represenative (must comment) Choice offered to / list presented to : Adult  Children Adrian Schultz ownership interest in [facility].provided to:: Adult Children    Expected Discharge Plan and Services     Post Acute Care Choice: Skilled Nursing Facility Living arrangements for the past 2 months: Hotel/Motel, Apartment                                      Prior Living Arrangements/Services Living arrangements for the past 2 months: Hotel/Motel, Apartment Lives with:: Self Patient language and need for interpreter reviewed:: No Do you feel safe going back to the place where you live?: Yes      Need for Family Participation in Patient Care: Yes (Comment) Care giver support system in place?: No (comment)   Criminal Activity/Legal Involvement Pertinent to Current Situation/Hospitalization: No - Comment as needed  Activities of Daily Living   ADL Screening (condition at time of admission) Independently performs ADLs?: Yes (appropriate for developmental age) Is the patient deaf or have difficulty hearing?: No Does the patient have difficulty seeing, even when wearing glasses/contacts?: No Does the patient have difficulty concentrating, remembering, or making decisions?: No  Permission Sought/Granted Permission sought to share information with : Facility Medical Sales Representative, Family Supports Permission granted to share information with : Yes, Verbal Permission Granted  Share Information with NAME: Adrian Schultz Adrian Schultz  Permission granted to share info w AGENCY: SNF  Permission granted to share info w Relationship: Aunt, Daughter     Emotional Assessment Appearance:: Appears older than stated age Attitude/Demeanor/Rapport: Unable to Assess Affect (typically observed): Unable to Assess Orientation: : Oriented to Self, Oriented to  Place Alcohol / Substance Use: Alcohol Use Psych Involvement: No (comment)  Admission diagnosis:  Hypokalemia [E87.6] History of alcohol abuse [F10.11] Vomiting and diarrhea [R11.10, R19.7] Generalized weakness  [R53.1] Patient Active Problem List   Diagnosis Date Noted   Hypokalemia 12/25/2024   Unintentional weight loss 02/20/2022   Psychosis (HCC) 08/12/2019   Microalbuminuria 01/30/2019   Alcohol use disorder, severe, in early remission (HCC) 12/05/2018   Chronic pain of left knee 05/08/2018   Primary localized osteoarthritis of left knee 12/27/2017   Cervicalgia 12/27/2017   Anxiety 12/27/2017   History of panic attacks 12/27/2017   Chronic right shoulder pain 12/27/2017   Essential hypertension 10/04/2017   Alcohol abuse with alcohol-induced mood disorder (HCC) 09/06/2017   Hypercholesteremia 07/04/2017   Type 2 diabetes mellitus without complication, without long-term current use of insulin  (HCC) 04/05/2017   PCP:  Patient, No Pcp Per Pharmacy:   DARRYLE LONG - [facility] [address] [place] [address] Phone: [phone] Fax: [phone]     Social Drivers of Health (SDOH) Social History: SDOH Screenings   Food Insecurity: Food Insecurity Present (12/25/2024)  Housing: Unknown (12/30/2024)  Recent Concern: Housing - High Risk (12/25/2024)  Transportation Needs: Unmet Transportation Needs (12/25/2024)  Utilities: Not At Risk (12/25/2024)  Alcohol Screen: Low Risk (09/13/2023)  Depression (PHQ2-9): High Risk (03/14/2023)  Financial Resource Strain: High Risk (09/13/2023)  Physical Activity: Insufficiently Active (09/13/2023)  Social Connections: Moderately Isolated (09/13/2023)  Stress: Stress Concern Present (09/13/2023)  Tobacco Use: High Risk (12/30/2024)  Health Literacy: Inadequate Health Literacy (09/13/2023)   SDOH Interventions: Food Insecurity Interventions: Inpatient TOC, Community Resources Provided Transportation Interventions: Inpatient TOC, Community Resources Provided   Readmission Risk Interventions     No data to display

## 2024-12-31 NOTE — Progress Notes (Signed)
 " PROGRESS NOTE  MAAN ZARCONE  FMW:[id number] DOB: [birth year] DOA: 12/25/2024 PCP: Patient, No Pcp Per   Brief Narrative: Patient is a 59 year old male with history of diabetes type 2, hypertension, hyperlipidemia, alcohol abuse, tobacco use who presented with generalized weakness, lethargy.  On plantation, he was severely hypomagnesemic, hypokalemic.  Also found to have right-sided weakness and dysarthria.  Code stroke was called.  Found to have acute ischemic stroke with bilateral punctuate MCA/ACA watershed infarcts.  Order completed.  Neurology is following.  PT recommending SNF on discharge.  Medically stable for discharge whenever possible.  TOC following  Assessment & Plan:  Principal Problem:   Hypokalemia  Acute ischemic stroke: Presented with right-sided weakness, dysarthria.  Found to have bilateral punctuate MCA/ACA watershed infarcts.  Etiology remains unclear, likely atherosclerosis.  Could not rule out cardioembolic source.  CTA head/neck showed right ICA 90% stenosis however had motion artifact, left ICA 30 to 35% stenosis.  Carotid Doppler did not show significant stenosis on bilateral ICA bulb.  MRI showed small acute white matter infarct in the right frontal lobe, high left frontal lobe.  Venous Doppler negative for DVT.  Patient refused TEE.  Echo showed EF of 60 to 65%, no PFO.  Neurology recommended 30-day event monitoring to rule out A-fib which has been arranged.  LDL of 24, no indication for statin, A1c of 6.2.  Currently on aspirin , Plavix , plan for DAPT for 3 weeks then aspirin  alone.  Hypercoagulable panel pending.  He needs to follow-up with neurology as an outpatient. PT recommending SNF on discharge.  Diabetes type 2: A1c of 6.2.  Takes Lantus  at home.  Currently on sliding scale, Lantus .  Diabetic coordinator following  Hypertension: Currently blood pressure stable without any medications  History of alcohol abuse: Initially started on CIWA protocol.  Currently not  in withdrawal.  Continue thiamine , folic acid   Tobacco use: Counseled for cessation  Hypokalemia/hypomagnesemia: Supplemented and corrected  Macrocytic anemia: Normal vitamin B12, folic acid  level.  Low iron.  Started on oral iron supplementation.  No evidence of acute blood loss.  Hemoglobin in the range of 7  Debility/deconditioning: Patient lives alone.  PT recommending SNF at discharge.  TOC following.        DVT prophylaxis:enoxaparin  (LOVENOX ) injection 40 mg Start: 12/25/24 2200     Code Status: Full Code  Family Communication: None at the bedside  Patient status: Inpatient  Patient is from : Home  Anticipated discharge to: SNF if  Estimated DC date: Whenever possible   Consultants: Neurology  Procedures: None  Antimicrobials:  Anti-infectives (From admission, onward)    None       Subjective:  Patient seen and examined at bedside today.  Hemodynamically stable lying in bed.  Appears comfortable.  Not in any Distress.  No new complaints.  Oriented to place.  Moves all extremities equally but has global weakness  Objective: Vitals:   12/30/24 1946 12/31/24 0016 12/31/24 0416 12/31/24 0749  BP: 131/71 (!) 108/56 (!) 150/74 118/77  Pulse: 95 98 97 98  Resp: 16 17 18 18   Temp: 99.3 F (37.4 C) 100 F (37.8 C) 100 F (37.8 C) 99.8 F (37.7 C)  TempSrc: Oral Oral Axillary Oral  SpO2: 98% 98% 100% 100%  Weight:      Height:        Intake/Output Summary (Last 24 hours) at 12/31/2024 1148 Last data filed at 12/31/2024 0618 Gross per 24 hour  Intake --  Output 800 ml  Net -  800 ml   Filed Weights   12/26/24 1850 12/28/24 1730  Weight: 83.9 kg 83.5 kg    Examination:  General exam: Overall comfortable, not in distress HEENT: PERRL Respiratory system:  no wheezes or crackles  Cardiovascular system: S1 & S2 heard, RRR.  Gastrointestinal system: Abdomen is nondistended, soft and nontender. Central nervous system: Alert and awake, oriented to  place, global weakness, moving all extremities equally. Extremities: No edema, no clubbing ,no cyanosis Skin: No rashes, no ulcers,no icterus     Data Reviewed: I have personally reviewed following labs and imaging studies  CBC: Recent Labs  Lab 12/28/24 0246 12/29/24 0413 12/30/24 0234 12/30/24 1028 12/30/24 1656  WBC 8.8 7.1 7.3 7.5 9.0  NEUTROABS 7.1 5.0 5.4 5.6 7.0  HGB 8.6* 8.2* 7.0* 8.0* 7.9*  HCT 24.5* 23.9* 20.0* 23.4* 22.8*  MCV 100.8* 101.3* 100.5* 104.0* 101.8*  PLT 209 201 192 227 257   Basic Metabolic Panel: Recent Labs  Lab 12/25/24 1621 12/25/24 2102 12/26/24 0245 12/26/24 0249 12/26/24 1426 12/27/24 1043 12/27/24 1517 12/28/24 0246 12/28/24 0938 12/28/24 1418 12/29/24 0413 12/30/24 0848 12/31/24 0210  NA  --    < >  --    < > 136 140  --  142  --   --  139 136 140  K  --    < >  --    < > 2.4* >7.5*   < > 2.5* 2.6* 2.8* 2.4* 2.8* 3.8  CL  --    < >  --    < > 96* 111  --  101  --   --  99 100 104  CO2  --    < >  --    < > 32 28  --  33*  --   --  31 31 31   GLUCOSE  --    < >  --    < > 140* 131*  --  131*  --   --  160* 136* 216*  BUN  --    < >  --    < > 13 10  --  9  --   --  10 9 10   CREATININE  --    < >  --    < > 0.93 0.78  --  0.83  --   --  0.82 0.71 0.86  CALCIUM   --    < >  --    < > 7.2* 6.3*  --  7.4*  --   --  7.5* 7.4* 7.7*  MG 1.2*   < >  --    < > 2.9* 1.9  --   --  1.6*  --  1.8 1.4*  --   PHOS 2.5  --  2.1*  --   --   --   --   --   --   --   --   --   --    < > = values in this interval not displayed.     No results found for this or any previous visit (from the past 240 hours).   Radiology Studies: No results found.  Scheduled Meds:  aspirin  EC  81 mg Oral Daily   clopidogrel   75 mg Oral Daily   enoxaparin  (LOVENOX ) injection  40 mg Subcutaneous Q24H   folic acid   1 mg Oral Daily   insulin  aspart  0-15 Units Subcutaneous TID WC   insulin  aspart  0-5 Units Subcutaneous QHS  insulin  glargine-yfgn  8 Units  Subcutaneous Daily   nicotine   14 mg Transdermal Daily   pantoprazole  (PROTONIX ) IV  40 mg Intravenous Daily   Continuous Infusions:   LOS: 5 days   Ivonne Mustache, MD [facility] P2/03/2025, 11:48 AM  "

## 2025-01-01 DIAGNOSIS — Z7902 Long term (current) use of antithrombotics/antiplatelets: Secondary | ICD-10-CM

## 2025-01-01 DIAGNOSIS — D649 Anemia, unspecified: Secondary | ICD-10-CM

## 2025-01-01 DIAGNOSIS — K625 Hemorrhage of anus and rectum: Secondary | ICD-10-CM

## 2025-01-01 DIAGNOSIS — I639 Cerebral infarction, unspecified: Secondary | ICD-10-CM

## 2025-01-01 LAB — GLUCOSE, CAPILLARY
Glucose-Capillary: 129 mg/dL — ABNORMAL HIGH (ref 70–99)
Glucose-Capillary: 100 mg/dL — ABNORMAL HIGH (ref 70–99)
Glucose-Capillary: 210 mg/dL — ABNORMAL HIGH (ref 70–99)
Glucose-Capillary: 110 mg/dL — ABNORMAL HIGH (ref 70–99)

## 2025-01-01 LAB — BASIC METABOLIC PANEL WITH GFR
Anion gap: 7 (ref 5–15)
BUN: 12 mg/dL (ref 6–20)
CO2: 27 mmol/L (ref 22–32)
Calcium: 7.5 mg/dL — ABNORMAL LOW (ref 8.9–10.3)
Chloride: 101 mmol/L (ref 98–111)
Creatinine, Ser: 0.96 mg/dL (ref 0.61–1.24)
GFR, Estimated: >60 mL/min
Glucose, Bld: 125 mg/dL — ABNORMAL HIGH (ref 70–99)
Potassium: 4.1 mmol/L (ref 3.5–5.1)
Sodium: 135 mmol/L (ref 135–145)

## 2025-01-01 LAB — CBC
HCT: 21.3 % — ABNORMAL LOW (ref 39.0–52.0)
Hemoglobin: 7.1 g/dL — ABNORMAL LOW (ref 13.0–17.0)
MCH: 34.8 pg — ABNORMAL HIGH (ref 26.0–34.0)
MCHC: 33.3 g/dL (ref 30.0–36.0)
MCV: 104.4 fL — ABNORMAL HIGH (ref 80.0–100.0)
Platelets: 229 10*3/uL (ref 150–400)
RBC: 2.04 MIL/uL — ABNORMAL LOW (ref 4.22–5.81)
RDW: 14.8 % (ref 11.5–15.5)
WBC: 8.6 10*3/uL (ref 4.0–10.5)
nRBC: 0 % (ref 0.0–0.2)

## 2025-01-01 LAB — MAGNESIUM: Magnesium: 1.5 mg/dL — ABNORMAL LOW (ref 1.7–2.4)

## 2025-01-01 MED ORDER — PANTOPRAZOLE SODIUM 40 MG PO TBEC: 40.0000 mg | DELAYED_RELEASE_TABLET | Freq: Every day | ORAL | Status: DC

## 2025-01-01 MED ORDER — PANTOPRAZOLE SODIUM 40 MG PO TBEC: 40.0000 mg | DELAYED_RELEASE_TABLET | Freq: Two times a day (BID) | ORAL | Status: AC

## 2025-01-01 MED ORDER — HYDROCORTISONE ACETATE 25 MG RE SUPP
25.0000 mg | Freq: Two times a day (BID) | RECTAL | Status: AC
  Administered 2025-01-01: 25 mg via RECTAL
  Filled 2025-01-01: qty 1

## 2025-01-01 MED ORDER — MAGNESIUM SULFATE 4 GM/100ML IV SOLN
4.0000 g | Freq: Once | INTRAVENOUS | Status: AC
  Administered 2025-01-01: 4 g via INTRAVENOUS
  Filled 2025-01-01: qty 100

## 2025-01-01 NOTE — Progress Notes (Addendum)
 " PROGRESS NOTE  Adrian Schultz  FMW:[id number] DOB: 19-Oct-1966 DOA: 12/25/2024 PCP: Patient, No Pcp Per   Brief Narrative: Patient is a 59 year old male with history of diabetes type 2, hypertension, hyperlipidemia, alcohol abuse, tobacco use who presented with generalized weakness, lethargy.  On plantation, he was severely hypomagnesemic, hypokalemic.  Also found to have right-sided weakness and dysarthria.  Code stroke was called.  Found to have acute ischemic stroke with bilateral punctuate MCA/ACA watershed infarcts.  Order completed.  Neurology is following.  PT recommending SNF on discharge.  Medically stable for discharge whenever possible.  TOC following  Assessment & Plan:  Principal Problem:   Hypokalemia  Acute ischemic stroke: Presented with right-sided weakness, dysarthria.  Found to have bilateral punctuate MCA/ACA watershed infarcts.  Etiology remains unclear, likely atherosclerosis.  Could not rule out cardioembolic source.  CTA head/neck showed right ICA 90% stenosis however had motion artifact, left ICA 30 to 35% stenosis.  Carotid Doppler did not show significant stenosis on bilateral ICA bulb.  MRI showed small acute white matter infarct in the right frontal lobe, high left frontal lobe.  Venous Doppler negative for DVT.  Patient refused TEE.  Echo showed EF of 60 to 65%, no PFO.  Neurology recommended 30-day event monitoring to rule out A-fib which has been arranged.  LDL of 24, no indication for statin, A1c of 6.2.  Currently on aspirin , Plavix , plan for DAPT for 3 weeks then aspirin  alone.  Hypercoagulable panel pending.  He needs to follow-up with neurology as an outpatient. PT recommending SNF on discharge.  Diabetes type 2: A1c of 6.2.  Takes Lantus  at home.  Currently on sliding scale, Lantus .  Diabetic coordinator following  Hypertension: Currently blood pressure stable without any medications  History of alcohol abuse: Initially started on CIWA protocol.  Currently not  in withdrawal.  Continue thiamine , folic acid   Tobacco use: Counseled for cessation  Hypokalemia/hypomagnesemia: Being monitored and supplemented  Macrocytic anemia: Normal vitamin B12, folic acid  level.  Low iron.  Started on oral iron supplementation.  No evidence of acute blood loss.  Hemoglobin in the range of 7.1 today.  Will transfuse if hemoglobin drops less than 7  Debility/deconditioning: Patient lives alone.  PT recommending SNF at discharge.  TOC following.        DVT prophylaxis:enoxaparin  (LOVENOX ) injection 40 mg Start: 12/25/24 2200     Code Status: Full Code  Family Communication: None at the bedside  Patient status: Inpatient  Patient is from : Home  Anticipated discharge to: SNF   Estimated DC date: Whenever possible   Consultants: Neurology  Procedures: None  Antimicrobials:  Anti-infectives (From admission, onward)    None       Subjective:  Patient seen and examined at bedside today.  Hemodynamically stable, lying in bed.  Weak and deconditioned but overall comfortable.  Denies new complaints ,denied any abdominal pain, nausea or vomiting, chest pain or shortness of breath  Objective: Vitals:   01/01/25 0033 01/01/25 0400 01/01/25 0600 01/01/25 0700  BP: 117/68 95/70  (!) 98/58  Pulse: 93 89    Resp: 18 16    Temp: 98.8 F (37.1 C) (!) 100.4 F (38 C) 99.3 F (37.4 C) 98.3 F (36.8 C)  TempSrc: Oral Oral Oral Oral  SpO2: 98% 98%    Weight:      Height:        Intake/Output Summary (Last 24 hours) at 01/01/2025 1139 Last data filed at 01/01/2025 0600 Gross per 24  hour  Intake 240 ml  Output 1750 ml  Net -1510 ml   Filed Weights   12/26/24 1850 12/28/24 1730  Weight: 83.9 kg 83.5 kg    Examination:   General exam: Overall comfortable, not in distress HEENT: PERRL Respiratory system:  no wheezes or crackles  Cardiovascular system: S1 & S2 heard, RRR.  Gastrointestinal system: Abdomen is nondistended, soft and  nontender. Central nervous system: Alert and awake.  Oriented to place, global weakness, moves all extremities equally Extremities: No edema, no clubbing ,no cyanosis Skin: No rashes, no ulcers,no icterus     Data Reviewed: I have personally reviewed following labs and imaging studies  CBC: Recent Labs  Lab 12/28/24 0246 12/29/24 0413 12/30/24 0234 12/30/24 1028 12/30/24 1656 01/01/25 0206  WBC 8.8 7.1 7.3 7.5 9.0 8.6  NEUTROABS 7.1 5.0 5.4 5.6 7.0  --   HGB 8.6* 8.2* 7.0* 8.0* 7.9* 7.1*  HCT 24.5* 23.9* 20.0* 23.4* 22.8* 21.3*  MCV 100.8* 101.3* 100.5* 104.0* 101.8* 104.4*  PLT 209 201 192 227 257 229   Basic Metabolic Panel: Recent Labs  Lab 12/25/24 1621 12/25/24 2102 12/26/24 0245 12/26/24 0249 12/27/24 1043 12/27/24 1517 12/28/24 0246 12/28/24 0938 12/28/24 1418 12/29/24 0413 12/30/24 0848 12/31/24 0210 01/01/25 0206 01/01/25 0909  NA  --    < >  --    < > 140  --  142  --   --  139 136 140  --  135  K  --    < >  --    < > >7.5*   < > 2.5* 2.6* 2.8* 2.4* 2.8* 3.8  --  4.1  CL  --    < >  --    < > 111  --  101  --   --  99 100 104  --  101  CO2  --    < >  --    < > 28  --  33*  --   --  31 31 31   --  27  GLUCOSE  --    < >  --    < > 131*  --  131*  --   --  160* 136* 216*  --  125*  BUN  --    < >  --    < > 10  --  9  --   --  10 9 10   --  12  CREATININE  --    < >  --    < > 0.78  --  0.83  --   --  0.82 0.71 0.86  --  0.96  CALCIUM   --    < >  --    < > 6.3*  --  7.4*  --   --  7.5* 7.4* 7.7*  --  7.5*  MG 1.2*   < >  --    < > 1.9  --   --  1.6*  --  1.8 1.4*  --  1.5*  --   PHOS 2.5  --  2.1*  --   --   --   --   --   --   --   --   --   --   --    < > = values in this interval not displayed.     No results found for this or any previous visit (from the past 240 hours).   Radiology Studies: No results found.  Scheduled Meds:  aspirin  EC  81 mg Oral Daily   clopidogrel   75 mg Oral Daily   enoxaparin  (LOVENOX ) injection  40 mg Subcutaneous  Q24H   ferrous sulfate   325 mg Oral Q breakfast   folic acid   1 mg Oral Daily   insulin  aspart  0-15 Units Subcutaneous TID WC   insulin  aspart  0-5 Units Subcutaneous QHS   insulin  glargine-yfgn  8 Units Subcutaneous Daily   nicotine   14 mg Transdermal Daily   pantoprazole  (PROTONIX ) IV  40 mg Intravenous Daily   Continuous Infusions:   LOS: 6 days   Ivonne Mustache, MD [facility] P2/04/2025, 11:39 AM  "

## 2025-01-01 NOTE — Plan of Care (Signed)
   Problem: Skin Integrity: Goal: Risk for impaired skin integrity will decrease Outcome: Progressing   Problem: Activity: Goal: Risk for activity intolerance will decrease Outcome: Progressing   Problem: Coping: Goal: Level of anxiety will decrease Outcome: Progressing

## 2025-01-01 NOTE — TOC Progression Note (Signed)
 Transition of Care [facility]) - Progression Note    Patient Details  Name: Adrian Schultz MRN: [id number] Date of Birth: February 05, 1966  [facility] CM/SW Contact  [name] Goodie, [place] Phone Number: 01/01/2025, 12:52 PM  Clinical Narrative:   Patient received PASRR: [id number] H. CSW confirmed with [facility] that authorization is pending. CSW to follow.    Expected Discharge Plan: Skilled Nursing Facility Barriers to Discharge: Insurance Authorization               Expected Discharge Plan and Services     Post Acute Care Choice: Skilled Nursing Facility Living arrangements for the past 2 months: Hotel/Motel, Apartment                                       Social Drivers of Health (SDOH) Interventions SDOH Screenings   Food Insecurity: Food Insecurity Present (12/25/2024)  Housing: Unknown (12/30/2024)  Recent Concern: Housing - High Risk (12/25/2024)  Transportation Needs: Unmet Transportation Needs (12/25/2024)  Utilities: Not At Risk (12/25/2024)  Alcohol Screen: Low Risk (09/13/2023)  Depression (PHQ2-9): High Risk (03/14/2023)  Financial Resource Strain: High Risk (09/13/2023)  Physical Activity: Insufficiently Active (09/13/2023)  Social Connections: Moderately Isolated (09/13/2023)  Stress: Stress Concern Present (09/13/2023)  Tobacco Use: High Risk (12/30/2024)  Health Literacy: Inadequate Health Literacy (09/13/2023)    Readmission Risk Interventions     No data to display

## 2025-01-01 NOTE — Progress Notes (Signed)
 Speech Language Pathology Treatment: Cognitive-Linguistic  Patient Details Name: DARYON REMMERT MRN: [id number] DOB: 06/04/1966 Today's Date: 01/01/2025 Time: 1445-1500 SLP Time Calculation (min) (ACUTE ONLY): 15 min  Assessment / Plan / Recommendation Clinical Impression  Pt seen for SLP session to address cognitive goals. Therapy session challenging because pt has limited awareness of deficits and does not believe that he had a stroke. Pt denied communication, cognitive, or even physical deficits since hospitalization; though did admit that he cannot walk and he was able to walk prior to hospitalization. SLP prompted simple sequencing tasks about daily routine; however, pt offered limited engagement for therapy task attempts.   SLP will continue to follow as pt is able to participate in therapy session to address cognitive-linguistic goals. Recommend SLP intervention at next level of care.    HPI HPI: Talin Feister is a 59 y.o. male who presented to the hospital from home on 12/25/24 with severe generalized weakness and found to have hypomagnesemia, hypokalemia and was placed on CIWA protocol. On 2/1 he was noted to be less verbal and having right sided weakness. MRI brain showed Small acute white matter infarcts in the right frontal lobe and high left frontal lobe as well as  Remote infarcts in the anterior left corona radiata and anterior right frontal lobe. PMH: DM, HTN, HLD, ETOH abuse (quit drinking several weeks ago).      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

## 2025-01-01 NOTE — Consult Note (Signed)
 "                                               Consultation Note   Referring Provider:   [facility] PCP: Patient, No Pcp Per Primary Gastroenterologist: Unassigned       Reason for Consultation: Rectal bleeding DOA: 12/25/2024         Hospital Day: 8   Assessment:: # 59 yo male with acute CVA ( this admit), started on DAPT    # Worsening macrocytic anemia # Acute rectal bleeding # Reported dark stools ate home within last few weeks ( prior to admission)  Rectal bleeding probably hemorrhoidal . Not currently having dark stools, BUN normal so not sure what to make of the history he gives about recent dark stools. However his hgb has clearly been declining.   Baseline hgb unknown but was 15 in 2022. It was 10.7 on admission, now at 7.1.   # Etoh use disorder Elevated ammonia, macrocytosis. No evidence at this point of cirrhosis. Normal platelets, normal INR.   # See PMH for any additional medical history  / medical problems  Plan: -- Patient needs EGD and colonoscopy.  He is at increased risk for procedures /sedation given recent CVA.  Furthermore he most likely would not be able to hold Plavix .  From reading the notes it looks like Neurology has planned on DAPT for 3 weeks and then aspirin  alone ?  One possibility is to do an EGD on Plavix  this admission and postpone colonoscopy until he is off Plavix .  This is of course if patient will agree.  My understanding is that he did not agree to a TEE.  I will discuss with Dr. Leigh and we will get a plan together and discuss options with patient --BID pantoprazole  in the interim --Trend H/H -- Empiric Anusol  suppositories twice daily x 7 days   Principal Problem:   Hypokalemia   HPI:   Patient is a 59 year old male with history of hypertension, hyperlipidemia, type II diabetes, alcohol use disorder.  He was admitted several days ago with right-sided weakness and dysarthria.  Found to have an acute ischemic stroke.  Cause  unclear.  Neurology had recommended a 30-day event monitoring to rule out A-fib.  He apparently refused TEE .  He was started on aspirin  and Plavix .  Plan was for DAPT for 3 weeks then aspirin  alone.  PT was recommending SNF on discharge.  We were asked to see the patient for rectal bleeding  When I asked Mr. Alessio about his rectal bleeding he said that he was  apparently having bleeding.  He was unable to elaborate.  He tells me that he has never seen any blood in his stools prior to admission.  However, he does endorse very dark stools at home in the weeks prior to this admission.  He has not had any rectal pain or abdominal pain associated with the onset of rectal bleeding.  He denies constipation.  He has never had a colonoscopy.  He has no known family history of colon cancer.    There are no recent labs which to compare his current labs.   In 2022 his hemoglobin was 15.6.  His presenting hemoglobin this admission was 10.4, it has drifted to 7.1 today.  He is macrocytic with an MCV of 104.  B12 688, folate  7, ferritin 1476, TIBC 133, iron sat 27%  Mr Crotty gives a longstanding history of heavy regular Etoh use.  On admission he had mildly elevated liver chemistries suggestive of Etoh use,  Ammonia mildly elevated at 45 but INR and platelets normal. Mild hypoalbuminemia but possibly nutritional or low as an acute phase reactant    Recent Labs    12/30/24 1028 12/30/24 1656 01/01/25 0206  WBC 7.5 9.0 8.6  HGB 8.0* 7.9* 7.1*  HCT 23.4* 22.8* 21.3*  MCV 104.0* 101.8* 104.4*  PLT 227 257 229   Recent Labs    12/30/24 0848 12/31/24 0210 01/01/25 0909  NA 136 140 135  K 2.8* 3.8 4.1  CL 100 104 101  CO2 31 31 27   GLUCOSE 136* 216* 125*  BUN 9 10 12   CREATININE 0.71 0.86 0.96  CALCIUM  7.4* 7.7* 7.5*    Review of Systems: All systems reviewed and negative except where noted in HPI.  Physical Exam: Vital signs in last 24 hours: Temp:  [98.3 F (36.8 C)-100.4 F (38 C)] 98.3 F  (36.8 C) (02/06 0700) Pulse Rate:  [89-94] 89 (02/06 0400) Resp:  [16-18] 16 (02/06 0400) BP: (95-130)/(58-76) 98/58 (02/06 0700) SpO2:  [96 %-98 %] 98 % (02/06 0400)   General:  Pleasant male in NAD Psych:  Cooperative.  Eyes: Pupils equal Ears:  Normal auditory acuity Nose: No deformity, discharge or lesions Neck:  Supple, no masses felt Lungs:  Clear to auscultation.  Heart:  Regular rate, regular rhythm.  Abdomen:  Soft, nondistended, nontender, active bowel sounds, no masses felt Rectal : External hemorrhoid tags , scant amount of soft light brown stool in vault .  No blood in vault  Neurologic:  Alert, oriented, grossly normal neurologically Extremities : No edema Skin: Multiple tattoosns.   OUTPATIENT MEDICATIONS Prior to Admission medications  Medication Sig Start Date End Date Taking? Authorizing Provider  Accu-Chek Softclix Lancets lancets Check blood sugar three times daily Patient not taking: Reported on 12/25/2024 02/20/22   Vicci Barnie NOVAK, MD  glucose blood (ACCU-CHEK GUIDE) test strip Check blood sugar three times daily Patient not taking: Reported on 12/25/2024 02/20/22   Vicci Barnie NOVAK, MD  insulin  glargine (LANTUS  SOLOSTAR) 100 UNIT/ML Solostar Pen Inject 10 Units into the skin daily. Patient not taking: Reported on 12/25/2024 02/20/22   Vicci Barnie NOVAK, MD  pregabalin  (LYRICA ) 50 MG capsule Take 1 capsule (50 mg total) by mouth 2 (two) times daily as directed by physician. Patient not taking: Reported on 12/25/2024 09/13/23   Vicci Barnie NOVAK, MD  glimepiride  (AMARYL ) 4 MG tablet Take 1 tablet (4 mg total) by mouth 2 (two) times daily. 09/29/20 11/01/20  Jaycee Greig PARAS, NP    Allergies as of 12/25/2024 - Review Complete 12/25/2024  Allergen Reaction Noted   Lisinopril  Other (See Comments) and Cough 06/06/2017   Buspar  [buspirone ] Nausea Only 05/28/2019    INPATIENT MEDICATIONS Current Facility-Administered Medications  Medication Dose Route Frequency  Provider Last Rate Last Admin   acetaminophen  (TYLENOL ) tablet 650 mg  650 mg Oral Q6H PRN Zella Katha HERO, MD   650 mg at 12/31/24 2129   Or   acetaminophen  (TYLENOL ) suppository 650 mg  650 mg Rectal Q6H PRN Zella, Mir M, MD       albuterol  (PROVENTIL ) (2.5 MG/3ML) 0.083% nebulizer solution 2.5 mg  2.5 mg Nebulization Q2H PRN Zella, Mir M, MD       aspirin  EC tablet 81 mg  81 mg Oral Daily  Jerri Pfeiffer, MD   81 mg at 01/01/25 0827   clopidogrel  (PLAVIX ) tablet 75 mg  75 mg Oral Daily Reome, Earle J, RPH   75 mg at 01/01/25 0827   enoxaparin  (LOVENOX ) injection 40 mg  40 mg Subcutaneous Q24H Zella, Mir M, MD   40 mg at 12/31/24 2129   ferrous sulfate  tablet 325 mg  325 mg Oral Q breakfast Jillian Buttery, MD   325 mg at 01/01/25 0827   folic acid  (FOLVITE ) tablet 1 mg  1 mg Oral Daily Reome, Earle J, RPH   1 mg at 01/01/25 0827   insulin  aspart (novoLOG ) injection 0-15 Units  0-15 Units Subcutaneous TID WC Zella Mir M, MD   2 Units at 01/01/25 0827   insulin  aspart (novoLOG ) injection 0-5 Units  0-5 Units Subcutaneous QHS Zella, Mir M, MD   2 Units at 12/30/24 2217   insulin  glargine-yfgn injection 8 Units  8 Units Subcutaneous Daily Adhikari, Amrit, MD   8 Units at 01/01/25 0827   nicotine  (NICODERM CQ  - dosed in mg/24 hours) patch 14 mg  14 mg Transdermal Daily Samtani, Jai-Gurmukh, MD   14 mg at 01/01/25 0830   ondansetron  (ZOFRAN ) tablet 4 mg  4 mg Oral Q6H PRN Zella Katha HERO, MD       Or   ondansetron  (ZOFRAN ) injection 4 mg  4 mg Intravenous Q6H PRN Zella Katha HERO, MD       NOREEN ON 01/02/2025] pantoprazole  (PROTONIX ) EC tablet 40 mg  40 mg Oral Daily Paytes, Massie LABOR, RPH         Past Medical History:  Diagnosis Date   Diabetes mellitus without complication (HCC)    ETOH abuse    Hypercholesteremia    Hypertension    Suicidal ideation     Past Surgical History:  Procedure Laterality Date   FOOT SURGERY     KNEE SURGERY      History reviewed.  No pertinent family history.  Social History   Socioeconomic History   Marital status: Divorced    Spouse name: Not on file   Number of children: Not on file   Years of education: Not on file   Highest education level: Not on file  Occupational History   Occupation: Unemployeed  Tobacco Use   Smoking status: Never   Smokeless tobacco: Current    Types: Snuff   Tobacco comments:    daily since 59 years old  Substance and Sexual Activity   Alcohol use: Yes    Alcohol/week: 0.0 standard drinks of alcohol    Comment: beer - daily 1/2 a case    Drug use: No   Sexual activity: Not Currently  Other Topics Concern   Not on file  Social History Narrative   Live in shelter - [employer]    Social Drivers of Health   Tobacco Use: High Risk (12/30/2024)   Patient History    Smoking Tobacco Use: Never    Smokeless Tobacco Use: Current    Passive Exposure: Not on file  Financial Resource Strain: High Risk (09/13/2023)   Overall Financial Resource Strain (CARDIA)    Difficulty of Paying Living Expenses: Hard  Food Insecurity: Food Insecurity Present (12/25/2024)   Epic    Worried About Programme Researcher, Broadcasting/film/video in the Last Year: Sometimes true    Ran Out of Food in the Last Year: Sometimes true  Transportation Needs: Unmet Transportation Needs (12/25/2024)   Epic    Lack of Transportation (Medical):  Yes    Lack of Transportation (Non-Medical): Yes  Physical Activity: Insufficiently Active (09/13/2023)   Exercise Vital Sign    Days of Exercise per Week: 1 day    Minutes of Exercise per Session: 10 min  Stress: Stress Concern Present (09/13/2023)   [employer] of Occupational Health - Occupational Stress Questionnaire    Feeling of Stress : Very much  Social Connections: Moderately Isolated (09/13/2023)   Social Connection and Isolation Panel    Frequency of Communication with Friends and Family: Once a week    Frequency of Social Gatherings with Friends and Family: Once a week     Attends Religious Services: More than 4 times per year    Active Member of Clubs or Organizations: Yes    Attends Banker Meetings: More than 4 times per year    Marital Status: Divorced  Intimate Partner Violence: Not At Risk (12/25/2024)   Epic    Fear of Current or Ex-Partner: No    Emotionally Abused: No    Physically Abused: No    Sexually Abused: No  Depression (PHQ2-9): High Risk (03/14/2023)   Depression (PHQ2-9)    PHQ-2 Score: 20  Alcohol Screen: Low Risk (09/13/2023)   Alcohol Screen    Last Alcohol Screening Score (AUDIT): 5  Housing: Unknown (12/30/2024)   Epic    Unable to Pay for Housing in the Last Year: Not on file    Number of Times Moved in the Last Year: 0    Homeless in the Last Year: No  Recent Concern: Housing - High Risk (12/25/2024)   Epic    Unable to Pay for Housing in the Last Year: Yes    Number of Times Moved in the Last Year: Not on file    Homeless in the Last Year: Not on file  Utilities: Not At Risk (12/25/2024)   Epic    Threatened with loss of utilities: No  Health Literacy: Inadequate Health Literacy (09/13/2023)   B1300 Health Literacy    Frequency of need for help with medical instructions: Sometimes    Code Status   Code Status: Full Code   Vina Dasen, NP-C   01/01/2025, 4:22 PM     "

## 2025-03-02 ENCOUNTER — Ambulatory Visit: Payer: MEDICAID | Admitting: Nurse Practitioner
